# Patient Record
Sex: Female | Born: 1955 | Race: Black or African American | Hispanic: No | Smoking: Never smoker
Health system: Southern US, Community
[De-identification: ages and names within clinical notes are randomized; demographics above are authoritative.]

## PROBLEM LIST (undated history)

## (undated) DIAGNOSIS — R7303 Prediabetes: Secondary | ICD-10-CM

## (undated) DIAGNOSIS — Z789 Other specified health status: Secondary | ICD-10-CM

## (undated) DIAGNOSIS — E559 Vitamin D deficiency, unspecified: Secondary | ICD-10-CM

## (undated) DIAGNOSIS — G8929 Other chronic pain: Secondary | ICD-10-CM

## (undated) DIAGNOSIS — M0520 Rheumatoid vasculitis with rheumatoid arthritis of unspecified site: Secondary | ICD-10-CM

## (undated) DIAGNOSIS — I10 Essential (primary) hypertension: Secondary | ICD-10-CM

## (undated) DIAGNOSIS — E785 Hyperlipidemia, unspecified: Secondary | ICD-10-CM

## (undated) DIAGNOSIS — M1990 Unspecified osteoarthritis, unspecified site: Secondary | ICD-10-CM

## (undated) DIAGNOSIS — K219 Gastro-esophageal reflux disease without esophagitis: Secondary | ICD-10-CM

## (undated) DIAGNOSIS — M158 Other polyosteoarthritis: Secondary | ICD-10-CM

## (undated) DIAGNOSIS — M25569 Pain in unspecified knee: Secondary | ICD-10-CM

## (undated) DIAGNOSIS — Z7289 Other problems related to lifestyle: Secondary | ICD-10-CM

## (undated) DIAGNOSIS — F1090 Alcohol use, unspecified, uncomplicated: Secondary | ICD-10-CM

## (undated) DIAGNOSIS — M25551 Pain in right hip: Secondary | ICD-10-CM

## (undated) DIAGNOSIS — R011 Cardiac murmur, unspecified: Secondary | ICD-10-CM

## (undated) DIAGNOSIS — Z6836 Body mass index (BMI) 36.0-36.9, adult: Secondary | ICD-10-CM

## (undated) HISTORY — DX: Other polyosteoarthritis: M15.8

## (undated) HISTORY — DX: Hyperlipidemia, unspecified: E78.5

## (undated) HISTORY — DX: Pain in right hip: M25.551

## (undated) HISTORY — DX: Other problems related to lifestyle: Z72.89

## (undated) HISTORY — PX: TOE SURGERY: SHX1073

## (undated) HISTORY — DX: Cardiac murmur, unspecified: R01.1

## (undated) HISTORY — DX: Rheumatoid vasculitis with rheumatoid arthritis of unspecified site: M05.20

## (undated) HISTORY — DX: Unspecified osteoarthritis, unspecified site: M19.90

## (undated) HISTORY — DX: Prediabetes: R73.03

## (undated) HISTORY — DX: Morbid (severe) obesity due to excess calories: E66.01

## (undated) HISTORY — DX: Vitamin D deficiency, unspecified: E55.9

## (undated) HISTORY — PX: COLONOSCOPY: SHX174

## (undated) HISTORY — DX: Gastro-esophageal reflux disease without esophagitis: K21.9

## (undated) HISTORY — DX: Other specified health status: Z78.9

## (undated) HISTORY — PX: TUBAL LIGATION: SHX77

## (undated) HISTORY — DX: Alcohol use, unspecified, uncomplicated: F10.90

## (undated) HISTORY — DX: Body mass index (BMI) 36.0-36.9, adult: Z68.36

---

## 1976-07-10 HISTORY — PX: TUBAL LIGATION: SHX77

## 2000-11-23 ENCOUNTER — Emergency Department (HOSPITAL_COMMUNITY): Admission: EM | Admit: 2000-11-23 | Discharge: 2000-11-23 | Payer: Self-pay | Admitting: *Deleted

## 2000-11-23 ENCOUNTER — Encounter: Payer: Self-pay | Admitting: *Deleted

## 2004-02-20 ENCOUNTER — Emergency Department (HOSPITAL_COMMUNITY): Admission: EM | Admit: 2004-02-20 | Discharge: 2004-02-20 | Payer: Self-pay | Admitting: *Deleted

## 2004-03-03 ENCOUNTER — Emergency Department (HOSPITAL_COMMUNITY): Admission: EM | Admit: 2004-03-03 | Discharge: 2004-03-03 | Payer: Self-pay | Admitting: Emergency Medicine

## 2004-06-10 ENCOUNTER — Ambulatory Visit: Payer: Self-pay | Admitting: Internal Medicine

## 2004-06-21 ENCOUNTER — Ambulatory Visit: Payer: Self-pay | Admitting: Family Medicine

## 2004-06-22 ENCOUNTER — Ambulatory Visit: Payer: Self-pay | Admitting: *Deleted

## 2004-07-07 ENCOUNTER — Emergency Department (HOSPITAL_COMMUNITY): Admission: EM | Admit: 2004-07-07 | Discharge: 2004-07-07 | Payer: Self-pay | Admitting: Emergency Medicine

## 2004-07-11 ENCOUNTER — Ambulatory Visit: Payer: Self-pay | Admitting: Family Medicine

## 2004-09-13 ENCOUNTER — Ambulatory Visit: Payer: Self-pay | Admitting: Family Medicine

## 2004-09-27 ENCOUNTER — Ambulatory Visit: Payer: Self-pay | Admitting: Family Medicine

## 2004-09-30 ENCOUNTER — Ambulatory Visit (HOSPITAL_COMMUNITY): Admission: RE | Admit: 2004-09-30 | Discharge: 2004-09-30 | Payer: Self-pay | Admitting: Family Medicine

## 2004-10-11 ENCOUNTER — Ambulatory Visit: Payer: Self-pay | Admitting: Family Medicine

## 2004-10-17 ENCOUNTER — Ambulatory Visit: Payer: Self-pay | Admitting: Family Medicine

## 2004-11-16 ENCOUNTER — Ambulatory Visit: Payer: Self-pay | Admitting: Family Medicine

## 2005-03-14 ENCOUNTER — Emergency Department (HOSPITAL_COMMUNITY): Admission: EM | Admit: 2005-03-14 | Discharge: 2005-03-14 | Payer: Self-pay | Admitting: Emergency Medicine

## 2005-05-29 ENCOUNTER — Ambulatory Visit: Payer: Self-pay | Admitting: Internal Medicine

## 2005-09-21 ENCOUNTER — Ambulatory Visit: Payer: Self-pay | Admitting: Internal Medicine

## 2005-10-12 ENCOUNTER — Ambulatory Visit: Payer: Self-pay | Admitting: Family Medicine

## 2005-10-25 ENCOUNTER — Ambulatory Visit: Payer: Self-pay | Admitting: Family Medicine

## 2005-11-30 ENCOUNTER — Ambulatory Visit: Payer: Self-pay | Admitting: Family Medicine

## 2005-12-05 ENCOUNTER — Ambulatory Visit (HOSPITAL_COMMUNITY): Admission: RE | Admit: 2005-12-05 | Discharge: 2005-12-05 | Payer: Self-pay | Admitting: Family Medicine

## 2005-12-06 ENCOUNTER — Ambulatory Visit: Payer: Self-pay | Admitting: Internal Medicine

## 2005-12-07 ENCOUNTER — Ambulatory Visit: Payer: Self-pay | Admitting: Internal Medicine

## 2005-12-11 ENCOUNTER — Emergency Department (HOSPITAL_COMMUNITY): Admission: EM | Admit: 2005-12-11 | Discharge: 2005-12-11 | Payer: Self-pay | Admitting: Emergency Medicine

## 2005-12-19 ENCOUNTER — Ambulatory Visit: Payer: Self-pay | Admitting: Internal Medicine

## 2005-12-19 ENCOUNTER — Ambulatory Visit: Payer: Self-pay | Admitting: Cardiology

## 2005-12-19 ENCOUNTER — Inpatient Hospital Stay (HOSPITAL_COMMUNITY): Admission: EM | Admit: 2005-12-19 | Discharge: 2005-12-21 | Payer: Self-pay | Admitting: *Deleted

## 2005-12-22 ENCOUNTER — Ambulatory Visit: Payer: Self-pay | Admitting: *Deleted

## 2005-12-26 ENCOUNTER — Ambulatory Visit: Payer: Self-pay | Admitting: Family Medicine

## 2006-01-08 ENCOUNTER — Encounter (INDEPENDENT_AMBULATORY_CARE_PROVIDER_SITE_OTHER): Payer: Self-pay | Admitting: Family Medicine

## 2006-01-08 ENCOUNTER — Encounter (INDEPENDENT_AMBULATORY_CARE_PROVIDER_SITE_OTHER): Payer: Self-pay | Admitting: Nurse Practitioner

## 2006-01-08 ENCOUNTER — Ambulatory Visit: Payer: Self-pay | Admitting: Family Medicine

## 2006-01-08 LAB — CONVERTED CEMR LAB
Bilirubin Urine: NEGATIVE
Ketones, ur: NEGATIVE mg/dL
Nitrite: NEGATIVE
Pap Smear: NORMAL
Urobilinogen, UA: 0.2
pH: 5.5

## 2006-01-09 ENCOUNTER — Encounter (INDEPENDENT_AMBULATORY_CARE_PROVIDER_SITE_OTHER): Payer: Self-pay | Admitting: Nurse Practitioner

## 2006-01-09 LAB — CONVERTED CEMR LAB: Chlamydia, DNA Probe: NEGATIVE

## 2006-03-06 ENCOUNTER — Ambulatory Visit: Payer: Self-pay | Admitting: Family Medicine

## 2006-04-23 ENCOUNTER — Emergency Department (HOSPITAL_COMMUNITY): Admission: EM | Admit: 2006-04-23 | Discharge: 2006-04-23 | Payer: Self-pay | Admitting: Emergency Medicine

## 2006-04-26 ENCOUNTER — Ambulatory Visit: Payer: Self-pay | Admitting: Family Medicine

## 2006-04-27 ENCOUNTER — Encounter (INDEPENDENT_AMBULATORY_CARE_PROVIDER_SITE_OTHER): Payer: Self-pay | Admitting: Nurse Practitioner

## 2006-04-27 LAB — CONVERTED CEMR LAB
HDL: 55 mg/dL
LDL Cholesterol: 89 mg/dL
Total CHOL/HDL Ratio: 3.1
Triglycerides: 131 mg/dL

## 2006-05-03 ENCOUNTER — Ambulatory Visit: Payer: Self-pay | Admitting: Family Medicine

## 2006-07-05 ENCOUNTER — Ambulatory Visit: Payer: Self-pay | Admitting: Internal Medicine

## 2006-07-06 ENCOUNTER — Emergency Department (HOSPITAL_COMMUNITY): Admission: EM | Admit: 2006-07-06 | Discharge: 2006-07-06 | Payer: Self-pay | Admitting: Emergency Medicine

## 2006-07-16 ENCOUNTER — Ambulatory Visit (HOSPITAL_COMMUNITY): Admission: RE | Admit: 2006-07-16 | Discharge: 2006-07-16 | Payer: Self-pay | Admitting: Internal Medicine

## 2006-07-27 ENCOUNTER — Ambulatory Visit: Payer: Self-pay | Admitting: Internal Medicine

## 2006-08-01 ENCOUNTER — Emergency Department (HOSPITAL_COMMUNITY): Admission: EM | Admit: 2006-08-01 | Discharge: 2006-08-01 | Payer: Self-pay | Admitting: Emergency Medicine

## 2006-08-07 ENCOUNTER — Emergency Department (HOSPITAL_COMMUNITY): Admission: EM | Admit: 2006-08-07 | Discharge: 2006-08-07 | Payer: Self-pay | Admitting: Emergency Medicine

## 2006-08-08 ENCOUNTER — Encounter: Admission: RE | Admit: 2006-08-08 | Discharge: 2006-11-06 | Payer: Self-pay | Admitting: Internal Medicine

## 2006-08-21 ENCOUNTER — Ambulatory Visit: Payer: Self-pay | Admitting: Family Medicine

## 2006-08-30 ENCOUNTER — Ambulatory Visit: Payer: Self-pay | Admitting: Family Medicine

## 2006-11-27 ENCOUNTER — Emergency Department (HOSPITAL_COMMUNITY): Admission: EM | Admit: 2006-11-27 | Discharge: 2006-11-27 | Payer: Self-pay | Admitting: Emergency Medicine

## 2006-12-12 ENCOUNTER — Emergency Department (HOSPITAL_COMMUNITY): Admission: EM | Admit: 2006-12-12 | Discharge: 2006-12-12 | Payer: Self-pay | Admitting: Emergency Medicine

## 2006-12-25 ENCOUNTER — Emergency Department (HOSPITAL_COMMUNITY): Admission: EM | Admit: 2006-12-25 | Discharge: 2006-12-25 | Payer: Self-pay | Admitting: Emergency Medicine

## 2006-12-28 ENCOUNTER — Ambulatory Visit: Payer: Self-pay | Admitting: Internal Medicine

## 2007-01-01 ENCOUNTER — Ambulatory Visit (HOSPITAL_COMMUNITY): Admission: RE | Admit: 2007-01-01 | Discharge: 2007-01-01 | Payer: Self-pay | Admitting: Internal Medicine

## 2007-01-07 ENCOUNTER — Ambulatory Visit: Payer: Self-pay | Admitting: Internal Medicine

## 2007-02-06 ENCOUNTER — Ambulatory Visit: Payer: Self-pay | Admitting: Family Medicine

## 2007-02-08 ENCOUNTER — Ambulatory Visit: Payer: Self-pay | Admitting: Family Medicine

## 2007-02-14 ENCOUNTER — Emergency Department (HOSPITAL_COMMUNITY): Admission: EM | Admit: 2007-02-14 | Discharge: 2007-02-15 | Payer: Self-pay | Admitting: Emergency Medicine

## 2007-02-15 ENCOUNTER — Ambulatory Visit: Payer: Self-pay | Admitting: Internal Medicine

## 2007-02-20 ENCOUNTER — Ambulatory Visit: Payer: Self-pay | Admitting: Internal Medicine

## 2007-02-20 ENCOUNTER — Encounter (INDEPENDENT_AMBULATORY_CARE_PROVIDER_SITE_OTHER): Payer: Self-pay | Admitting: Nurse Practitioner

## 2007-02-20 LAB — CONVERTED CEMR LAB
Basophils Absolute: 0 10*3/uL (ref 0.0–0.1)
Basophils Relative: 0 % (ref 0–1)
MCHC: 32.3 g/dL (ref 30.0–36.0)
Neutro Abs: 4.1 10*3/uL (ref 1.7–7.7)
Neutrophils Relative %: 60 % (ref 43–77)
Platelets: 284 10*3/uL (ref 150–400)
RBC: 4.49 M/uL (ref 3.87–5.11)
RDW: 11.6 % (ref 11.5–14.0)

## 2007-02-21 ENCOUNTER — Encounter (INDEPENDENT_AMBULATORY_CARE_PROVIDER_SITE_OTHER): Payer: Self-pay | Admitting: Nurse Practitioner

## 2007-02-21 LAB — CONVERTED CEMR LAB
Hemoglobin: 13.6 g/dL
Lymphocytes Relative: 34 %
Monocytes Absolute: 0.4 10*3/uL
Monocytes Relative: 5 %
Neutro Abs: 41 10*3/uL
Neutrophils Relative %: 60 %
RBC: 4.49 M/uL
WBC: 6.9 10*3/uL

## 2007-03-11 ENCOUNTER — Emergency Department (HOSPITAL_COMMUNITY): Admission: EM | Admit: 2007-03-11 | Discharge: 2007-03-11 | Payer: Self-pay | Admitting: Emergency Medicine

## 2007-03-26 ENCOUNTER — Emergency Department (HOSPITAL_COMMUNITY): Admission: EM | Admit: 2007-03-26 | Discharge: 2007-03-27 | Payer: Self-pay | Admitting: Emergency Medicine

## 2007-03-27 ENCOUNTER — Encounter (INDEPENDENT_AMBULATORY_CARE_PROVIDER_SITE_OTHER): Payer: Self-pay | Admitting: *Deleted

## 2007-03-28 ENCOUNTER — Telehealth (INDEPENDENT_AMBULATORY_CARE_PROVIDER_SITE_OTHER): Payer: Self-pay | Admitting: *Deleted

## 2007-04-23 ENCOUNTER — Emergency Department (HOSPITAL_COMMUNITY): Admission: EM | Admit: 2007-04-23 | Discharge: 2007-04-24 | Payer: Self-pay | Admitting: Emergency Medicine

## 2007-04-24 ENCOUNTER — Telehealth (INDEPENDENT_AMBULATORY_CARE_PROVIDER_SITE_OTHER): Payer: Self-pay | Admitting: *Deleted

## 2007-04-25 ENCOUNTER — Emergency Department (HOSPITAL_COMMUNITY): Admission: EM | Admit: 2007-04-25 | Discharge: 2007-04-25 | Payer: Self-pay | Admitting: Emergency Medicine

## 2007-05-01 ENCOUNTER — Emergency Department (HOSPITAL_COMMUNITY): Admission: EM | Admit: 2007-05-01 | Discharge: 2007-05-01 | Payer: Self-pay | Admitting: Emergency Medicine

## 2007-05-16 ENCOUNTER — Emergency Department (HOSPITAL_COMMUNITY): Admission: EM | Admit: 2007-05-16 | Discharge: 2007-05-16 | Payer: Self-pay | Admitting: Emergency Medicine

## 2007-05-17 ENCOUNTER — Emergency Department (HOSPITAL_COMMUNITY): Admission: EM | Admit: 2007-05-17 | Discharge: 2007-05-17 | Payer: Self-pay | Admitting: Emergency Medicine

## 2007-05-17 ENCOUNTER — Emergency Department (HOSPITAL_COMMUNITY): Admission: EM | Admit: 2007-05-17 | Discharge: 2007-05-17 | Payer: Self-pay | Admitting: Family Medicine

## 2007-05-20 ENCOUNTER — Ambulatory Visit: Payer: Self-pay | Admitting: Nurse Practitioner

## 2007-05-20 DIAGNOSIS — I10 Essential (primary) hypertension: Secondary | ICD-10-CM | POA: Insufficient documentation

## 2007-05-23 ENCOUNTER — Encounter (INDEPENDENT_AMBULATORY_CARE_PROVIDER_SITE_OTHER): Payer: Self-pay | Admitting: Nurse Practitioner

## 2007-05-23 DIAGNOSIS — J309 Allergic rhinitis, unspecified: Secondary | ICD-10-CM | POA: Insufficient documentation

## 2007-05-23 DIAGNOSIS — IMO0002 Reserved for concepts with insufficient information to code with codable children: Secondary | ICD-10-CM | POA: Insufficient documentation

## 2007-05-23 DIAGNOSIS — B353 Tinea pedis: Secondary | ICD-10-CM | POA: Insufficient documentation

## 2007-05-23 DIAGNOSIS — M1710 Unilateral primary osteoarthritis, unspecified knee: Secondary | ICD-10-CM

## 2007-05-23 DIAGNOSIS — K449 Diaphragmatic hernia without obstruction or gangrene: Secondary | ICD-10-CM | POA: Insufficient documentation

## 2007-05-23 DIAGNOSIS — K219 Gastro-esophageal reflux disease without esophagitis: Secondary | ICD-10-CM | POA: Insufficient documentation

## 2007-05-27 ENCOUNTER — Ambulatory Visit: Payer: Self-pay | Admitting: Nurse Practitioner

## 2007-05-27 DIAGNOSIS — K029 Dental caries, unspecified: Secondary | ICD-10-CM | POA: Insufficient documentation

## 2007-06-24 ENCOUNTER — Encounter (INDEPENDENT_AMBULATORY_CARE_PROVIDER_SITE_OTHER): Payer: Self-pay | Admitting: Nurse Practitioner

## 2007-06-25 ENCOUNTER — Telehealth (INDEPENDENT_AMBULATORY_CARE_PROVIDER_SITE_OTHER): Payer: Self-pay | Admitting: Nurse Practitioner

## 2007-06-26 ENCOUNTER — Encounter (INDEPENDENT_AMBULATORY_CARE_PROVIDER_SITE_OTHER): Payer: Self-pay | Admitting: Nurse Practitioner

## 2007-06-28 ENCOUNTER — Ambulatory Visit: Payer: Self-pay | Admitting: Nurse Practitioner

## 2007-07-17 ENCOUNTER — Encounter (INDEPENDENT_AMBULATORY_CARE_PROVIDER_SITE_OTHER): Payer: Self-pay | Admitting: Nurse Practitioner

## 2007-07-18 ENCOUNTER — Telehealth (INDEPENDENT_AMBULATORY_CARE_PROVIDER_SITE_OTHER): Payer: Self-pay | Admitting: Nurse Practitioner

## 2007-07-23 ENCOUNTER — Ambulatory Visit (HOSPITAL_COMMUNITY): Admission: RE | Admit: 2007-07-23 | Discharge: 2007-07-23 | Payer: Self-pay | Admitting: Nurse Practitioner

## 2007-07-23 ENCOUNTER — Ambulatory Visit: Payer: Self-pay | Admitting: Nurse Practitioner

## 2007-07-23 DIAGNOSIS — K5900 Constipation, unspecified: Secondary | ICD-10-CM | POA: Insufficient documentation

## 2007-07-23 DIAGNOSIS — R3129 Other microscopic hematuria: Secondary | ICD-10-CM | POA: Insufficient documentation

## 2007-07-23 LAB — CONVERTED CEMR LAB
Glucose, Urine, Semiquant: NEGATIVE
KOH Prep: NEGATIVE
Ketones, urine, test strip: NEGATIVE
pH: 5.5

## 2007-07-24 ENCOUNTER — Encounter (INDEPENDENT_AMBULATORY_CARE_PROVIDER_SITE_OTHER): Payer: Self-pay | Admitting: Nurse Practitioner

## 2007-07-24 LAB — CONVERTED CEMR LAB
Albumin: 4.4 g/dL (ref 3.5–5.2)
Alkaline Phosphatase: 66 units/L (ref 39–117)
BUN: 16 mg/dL (ref 6–23)
CO2: 24 meq/L (ref 19–32)
Calcium: 9.9 mg/dL (ref 8.4–10.5)
Chloride: 103 meq/L (ref 96–112)
GC Probe Amp, Genital: NEGATIVE
Glucose, Bld: 82 mg/dL (ref 70–99)
HDL: 52 mg/dL (ref >39)
LDL Cholesterol: 97 mg/dL (ref 0–99)
Lymphocytes Relative: 30 % (ref 12–46)
Lymphs Abs: 2.3 10*3/uL (ref 0.7–4.0)
MCV: 96.7 fL (ref 78.0–100.0)
Monocytes Relative: 7 % (ref 3–12)
Neutro Abs: 4.6 10*3/uL (ref 1.7–7.7)
Neutrophils Relative %: 61 % (ref 43–77)
Platelets: 286 10*3/uL (ref 150–400)
Potassium: 3.9 meq/L (ref 3.5–5.3)
RBC: 4.28 M/uL (ref 3.87–5.11)
Sodium: 143 meq/L (ref 135–145)
Total Protein: 7.9 g/dL (ref 6.0–8.3)
Triglycerides: 169 mg/dL — ABNORMAL HIGH (ref <150)
WBC: 7.5 10*3/uL (ref 4.0–10.5)

## 2007-08-06 ENCOUNTER — Telehealth (INDEPENDENT_AMBULATORY_CARE_PROVIDER_SITE_OTHER): Payer: Self-pay | Admitting: Nurse Practitioner

## 2007-08-07 ENCOUNTER — Ambulatory Visit: Payer: Self-pay | Admitting: Nurse Practitioner

## 2007-08-07 DIAGNOSIS — R51 Headache: Secondary | ICD-10-CM | POA: Insufficient documentation

## 2007-08-07 DIAGNOSIS — R519 Headache, unspecified: Secondary | ICD-10-CM | POA: Insufficient documentation

## 2007-08-07 DIAGNOSIS — K2970 Gastritis, unspecified, without bleeding: Secondary | ICD-10-CM | POA: Insufficient documentation

## 2007-08-07 DIAGNOSIS — K2990 Gastroduodenitis, unspecified, without bleeding: Secondary | ICD-10-CM

## 2007-08-07 LAB — CONVERTED CEMR LAB: Helicobacter Pylori Antibody-IgG: 0.6

## 2007-08-08 ENCOUNTER — Ambulatory Visit (HOSPITAL_COMMUNITY): Admission: RE | Admit: 2007-08-08 | Discharge: 2007-08-08 | Payer: Self-pay | Admitting: Family Medicine

## 2007-08-08 ENCOUNTER — Encounter (INDEPENDENT_AMBULATORY_CARE_PROVIDER_SITE_OTHER): Payer: Self-pay | Admitting: Nurse Practitioner

## 2007-08-13 ENCOUNTER — Telehealth (INDEPENDENT_AMBULATORY_CARE_PROVIDER_SITE_OTHER): Payer: Self-pay | Admitting: Nurse Practitioner

## 2007-08-15 ENCOUNTER — Encounter (INDEPENDENT_AMBULATORY_CARE_PROVIDER_SITE_OTHER): Payer: Self-pay | Admitting: Nurse Practitioner

## 2007-08-20 ENCOUNTER — Encounter (INDEPENDENT_AMBULATORY_CARE_PROVIDER_SITE_OTHER): Payer: Self-pay | Admitting: *Deleted

## 2007-08-21 ENCOUNTER — Encounter (INDEPENDENT_AMBULATORY_CARE_PROVIDER_SITE_OTHER): Payer: Self-pay | Admitting: Family Medicine

## 2007-08-21 ENCOUNTER — Telehealth (INDEPENDENT_AMBULATORY_CARE_PROVIDER_SITE_OTHER): Payer: Self-pay | Admitting: *Deleted

## 2007-08-21 ENCOUNTER — Encounter: Admission: RE | Admit: 2007-08-21 | Discharge: 2007-08-21 | Payer: Self-pay | Admitting: Family Medicine

## 2007-08-23 ENCOUNTER — Ambulatory Visit (HOSPITAL_COMMUNITY): Admission: RE | Admit: 2007-08-23 | Discharge: 2007-08-23 | Payer: Self-pay | Admitting: Internal Medicine

## 2007-08-23 ENCOUNTER — Telehealth (INDEPENDENT_AMBULATORY_CARE_PROVIDER_SITE_OTHER): Payer: Self-pay | Admitting: Nurse Practitioner

## 2007-09-04 ENCOUNTER — Ambulatory Visit: Payer: Self-pay | Admitting: Internal Medicine

## 2007-09-24 ENCOUNTER — Telehealth (INDEPENDENT_AMBULATORY_CARE_PROVIDER_SITE_OTHER): Payer: Self-pay | Admitting: Nurse Practitioner

## 2007-09-26 ENCOUNTER — Ambulatory Visit: Payer: Self-pay | Admitting: Nurse Practitioner

## 2007-09-26 DIAGNOSIS — N3289 Other specified disorders of bladder: Secondary | ICD-10-CM | POA: Insufficient documentation

## 2007-09-26 LAB — CONVERTED CEMR LAB
Glucose, Urine, Semiquant: NEGATIVE
Ketones, urine, test strip: NEGATIVE
Nitrite: NEGATIVE
Specific Gravity, Urine: <1.005
pH: 6.5

## 2007-10-02 ENCOUNTER — Telehealth (INDEPENDENT_AMBULATORY_CARE_PROVIDER_SITE_OTHER): Payer: Self-pay | Admitting: Nurse Practitioner

## 2007-10-03 ENCOUNTER — Emergency Department (HOSPITAL_COMMUNITY): Admission: EM | Admit: 2007-10-03 | Discharge: 2007-10-03 | Payer: Self-pay | Admitting: Emergency Medicine

## 2007-10-14 ENCOUNTER — Telehealth (INDEPENDENT_AMBULATORY_CARE_PROVIDER_SITE_OTHER): Payer: Self-pay | Admitting: Nurse Practitioner

## 2007-10-16 ENCOUNTER — Encounter (INDEPENDENT_AMBULATORY_CARE_PROVIDER_SITE_OTHER): Payer: Self-pay | Admitting: Nurse Practitioner

## 2007-10-16 ENCOUNTER — Ambulatory Visit (HOSPITAL_COMMUNITY): Admission: RE | Admit: 2007-10-16 | Discharge: 2007-10-16 | Payer: Self-pay | Admitting: Internal Medicine

## 2007-10-16 ENCOUNTER — Emergency Department (HOSPITAL_COMMUNITY): Admission: EM | Admit: 2007-10-16 | Discharge: 2007-10-16 | Payer: Self-pay | Admitting: Emergency Medicine

## 2007-10-17 ENCOUNTER — Ambulatory Visit: Payer: Self-pay | Admitting: Nurse Practitioner

## 2007-11-04 ENCOUNTER — Emergency Department (HOSPITAL_COMMUNITY): Admission: EM | Admit: 2007-11-04 | Discharge: 2007-11-05 | Payer: Self-pay | Admitting: Emergency Medicine

## 2007-11-05 ENCOUNTER — Telehealth (INDEPENDENT_AMBULATORY_CARE_PROVIDER_SITE_OTHER): Payer: Self-pay | Admitting: Nurse Practitioner

## 2007-11-07 ENCOUNTER — Ambulatory Visit: Payer: Self-pay | Admitting: Nurse Practitioner

## 2007-11-07 DIAGNOSIS — E876 Hypokalemia: Secondary | ICD-10-CM | POA: Insufficient documentation

## 2007-11-07 LAB — CONVERTED CEMR LAB
BUN: 16 mg/dL
CO2: 24 meq/L
Calcium: 9.7 mg/dL
Chloride: 99 meq/L
Creatinine, Ser: 0.95 mg/dL
Glucose, Bld: 85 mg/dL
Potassium: 3.8 meq/L
Sodium: 139 meq/L

## 2007-11-08 ENCOUNTER — Encounter (INDEPENDENT_AMBULATORY_CARE_PROVIDER_SITE_OTHER): Payer: Self-pay | Admitting: Nurse Practitioner

## 2007-11-11 ENCOUNTER — Ambulatory Visit: Payer: Self-pay | Admitting: Nurse Practitioner

## 2007-11-11 ENCOUNTER — Telehealth (INDEPENDENT_AMBULATORY_CARE_PROVIDER_SITE_OTHER): Payer: Self-pay | Admitting: Nurse Practitioner

## 2007-11-14 ENCOUNTER — Telehealth (INDEPENDENT_AMBULATORY_CARE_PROVIDER_SITE_OTHER): Payer: Self-pay | Admitting: *Deleted

## 2007-11-15 ENCOUNTER — Telehealth (INDEPENDENT_AMBULATORY_CARE_PROVIDER_SITE_OTHER): Payer: Self-pay | Admitting: Nurse Practitioner

## 2007-12-08 ENCOUNTER — Emergency Department (HOSPITAL_COMMUNITY): Admission: EM | Admit: 2007-12-08 | Discharge: 2007-12-09 | Payer: Self-pay | Admitting: Emergency Medicine

## 2007-12-18 ENCOUNTER — Encounter (INDEPENDENT_AMBULATORY_CARE_PROVIDER_SITE_OTHER): Payer: Self-pay | Admitting: Nurse Practitioner

## 2008-01-08 ENCOUNTER — Telehealth (INDEPENDENT_AMBULATORY_CARE_PROVIDER_SITE_OTHER): Payer: Self-pay | Admitting: Nurse Practitioner

## 2008-01-15 ENCOUNTER — Emergency Department (HOSPITAL_COMMUNITY): Admission: EM | Admit: 2008-01-15 | Discharge: 2008-01-16 | Payer: Self-pay | Admitting: Emergency Medicine

## 2008-01-16 ENCOUNTER — Telehealth (INDEPENDENT_AMBULATORY_CARE_PROVIDER_SITE_OTHER): Payer: Self-pay | Admitting: Nurse Practitioner

## 2008-01-22 ENCOUNTER — Emergency Department (HOSPITAL_COMMUNITY): Admission: EM | Admit: 2008-01-22 | Discharge: 2008-01-22 | Payer: Self-pay | Admitting: Emergency Medicine

## 2008-02-03 ENCOUNTER — Ambulatory Visit: Payer: Self-pay | Admitting: Gastroenterology

## 2008-02-03 DIAGNOSIS — R079 Chest pain, unspecified: Secondary | ICD-10-CM | POA: Insufficient documentation

## 2008-02-05 ENCOUNTER — Ambulatory Visit (HOSPITAL_COMMUNITY): Admission: RE | Admit: 2008-02-05 | Discharge: 2008-02-05 | Payer: Self-pay | Admitting: Gastroenterology

## 2008-02-10 ENCOUNTER — Ambulatory Visit: Payer: Self-pay | Admitting: Gastroenterology

## 2008-02-15 ENCOUNTER — Emergency Department (HOSPITAL_COMMUNITY): Admission: EM | Admit: 2008-02-15 | Discharge: 2008-02-15 | Payer: Self-pay | Admitting: Emergency Medicine

## 2008-02-18 ENCOUNTER — Telehealth: Payer: Self-pay | Admitting: Gastroenterology

## 2008-02-24 ENCOUNTER — Telehealth (INDEPENDENT_AMBULATORY_CARE_PROVIDER_SITE_OTHER): Payer: Self-pay | Admitting: Nurse Practitioner

## 2008-02-28 ENCOUNTER — Ambulatory Visit: Payer: Self-pay | Admitting: Gastroenterology

## 2008-03-02 ENCOUNTER — Ambulatory Visit: Payer: Self-pay | Admitting: Gastroenterology

## 2008-03-02 ENCOUNTER — Telehealth (INDEPENDENT_AMBULATORY_CARE_PROVIDER_SITE_OTHER): Payer: Self-pay | Admitting: Nurse Practitioner

## 2008-03-04 LAB — CONVERTED CEMR LAB
ALT: 21 units/L (ref 0–35)
BUN: 19 mg/dL (ref 6–23)
Basophils Relative: 0 % (ref 0.0–3.0)
CO2: 31 meq/L (ref 19–32)
Calcium: 9.8 mg/dL (ref 8.4–10.5)
Chloride: 100 meq/L (ref 96–112)
Creatinine, Ser: 1.1 mg/dL (ref 0.4–1.2)
Eosinophils Relative: 3.4 % (ref 0.0–5.0)
GFR calc Af Amer: 67 mL/min
GFR calc non Af Amer: 55 mL/min
Glucose, Bld: 140 mg/dL — ABNORMAL HIGH (ref 70–99)
Hemoglobin: 12.9 g/dL (ref 12.0–15.0)
Lymphocytes Relative: 37.9 % (ref 12.0–46.0)
Neutro Abs: 4.6 10*3/uL (ref 1.4–7.7)
Neutrophils Relative %: 55.5 % (ref 43.0–77.0)
RBC: 4.2 M/uL (ref 3.87–5.11)
WBC: 8.3 10*3/uL (ref 4.5–10.5)

## 2008-03-06 ENCOUNTER — Ambulatory Visit (HOSPITAL_COMMUNITY): Admission: RE | Admit: 2008-03-06 | Discharge: 2008-03-06 | Payer: Self-pay | Admitting: Gastroenterology

## 2008-03-06 ENCOUNTER — Encounter: Payer: Self-pay | Admitting: Gastroenterology

## 2008-03-10 ENCOUNTER — Telehealth: Payer: Self-pay | Admitting: Gastroenterology

## 2008-03-10 ENCOUNTER — Telehealth (INDEPENDENT_AMBULATORY_CARE_PROVIDER_SITE_OTHER): Payer: Self-pay | Admitting: *Deleted

## 2008-03-11 ENCOUNTER — Encounter: Payer: Self-pay | Admitting: Gastroenterology

## 2008-03-12 ENCOUNTER — Ambulatory Visit: Payer: Self-pay | Admitting: Nurse Practitioner

## 2008-04-02 ENCOUNTER — Telehealth (INDEPENDENT_AMBULATORY_CARE_PROVIDER_SITE_OTHER): Payer: Self-pay | Admitting: Nurse Practitioner

## 2008-05-14 ENCOUNTER — Telehealth (INDEPENDENT_AMBULATORY_CARE_PROVIDER_SITE_OTHER): Payer: Self-pay | Admitting: Nurse Practitioner

## 2008-06-24 ENCOUNTER — Telehealth (INDEPENDENT_AMBULATORY_CARE_PROVIDER_SITE_OTHER): Payer: Self-pay | Admitting: *Deleted

## 2008-06-24 ENCOUNTER — Emergency Department (HOSPITAL_COMMUNITY): Admission: EM | Admit: 2008-06-24 | Discharge: 2008-06-24 | Payer: Self-pay | Admitting: Emergency Medicine

## 2008-08-31 ENCOUNTER — Telehealth (INDEPENDENT_AMBULATORY_CARE_PROVIDER_SITE_OTHER): Payer: Self-pay | Admitting: Nurse Practitioner

## 2008-08-31 ENCOUNTER — Ambulatory Visit: Payer: Self-pay | Admitting: Nurse Practitioner

## 2008-08-31 DIAGNOSIS — J029 Acute pharyngitis, unspecified: Secondary | ICD-10-CM | POA: Insufficient documentation

## 2008-08-31 DIAGNOSIS — J069 Acute upper respiratory infection, unspecified: Secondary | ICD-10-CM | POA: Insufficient documentation

## 2008-08-31 LAB — CONVERTED CEMR LAB: Rapid Strep: NEGATIVE

## 2008-09-01 LAB — CONVERTED CEMR LAB
Eosinophils Absolute: 0.1 10*3/uL (ref 0.0–0.7)
Lymphs Abs: 3.1 10*3/uL (ref 0.7–4.0)
MCV: 91.5 fL (ref 78.0–100.0)
Neutro Abs: 3.3 10*3/uL (ref 1.7–7.7)
Neutrophils Relative %: 48 % (ref 43–77)
Platelets: 275 10*3/uL (ref 150–400)
WBC: 6.9 10*3/uL (ref 4.0–10.5)

## 2008-11-25 ENCOUNTER — Telehealth (INDEPENDENT_AMBULATORY_CARE_PROVIDER_SITE_OTHER): Payer: Self-pay | Admitting: *Deleted

## 2008-12-24 ENCOUNTER — Ambulatory Visit: Payer: Self-pay | Admitting: Nurse Practitioner

## 2008-12-24 DIAGNOSIS — R209 Unspecified disturbances of skin sensation: Secondary | ICD-10-CM | POA: Insufficient documentation

## 2009-01-14 ENCOUNTER — Telehealth (INDEPENDENT_AMBULATORY_CARE_PROVIDER_SITE_OTHER): Payer: Self-pay | Admitting: Nurse Practitioner

## 2009-01-20 ENCOUNTER — Telehealth (INDEPENDENT_AMBULATORY_CARE_PROVIDER_SITE_OTHER): Payer: Self-pay | Admitting: Nurse Practitioner

## 2009-02-25 ENCOUNTER — Ambulatory Visit: Payer: Self-pay | Admitting: Nurse Practitioner

## 2009-02-25 LAB — CONVERTED CEMR LAB
ALT: 22 U/L
AST: 21 U/L
Albumin: 4.2 g/dL
Alkaline Phosphatase: 78 U/L
BUN: 18 mg/dL
Basophils Absolute: 0 K/uL
Basophils Relative: 1 %
Bilirubin Urine: NEGATIVE
CO2: 25 meq/L
Calcium: 9.3 mg/dL
Chloride: 101 meq/L
Cholesterol: 177 mg/dL
Creatinine, Ser: 0.69 mg/dL
Eosinophils Absolute: 0.2 K/uL
Eosinophils Relative: 3 %
Glucose, Bld: 89 mg/dL
Glucose, Urine, Semiquant: NEGATIVE
HCT: 38.5 %
HDL: 44 mg/dL
Hemoglobin: 12.7 g/dL
Ketones, urine, test strip: NEGATIVE
LDL Cholesterol: 95 mg/dL
Lymphocytes Relative: 38 %
Lymphs Abs: 2.5 K/uL
MCHC: 33 g/dL
MCV: 91.4 fL
Microalb, Ur: 1.41 mg/dL
Monocytes Absolute: 0.6 K/uL
Monocytes Relative: 9 %
Neutro Abs: 3.3 K/uL
Neutrophils Relative %: 50 %
Nitrite: NEGATIVE
Platelets: 307 K/uL
Potassium: 4 meq/L
RBC: 4.21 M/uL
RDW: 12.4 %
Sodium: 141 meq/L
Specific Gravity, Urine: >=1.03
TSH: 1.901 u[IU]/mL
Total Bilirubin: 0.3 mg/dL
Total CHOL/HDL Ratio: 4
Total Protein: 7.8 g/dL
Triglycerides: 190 mg/dL — ABNORMAL HIGH
Urobilinogen, UA: 0.2
VLDL: 38 mg/dL
WBC Urine, dipstick: NEGATIVE
WBC: 6.7 10*3/microliter
pH: 5.5

## 2009-02-26 ENCOUNTER — Encounter (INDEPENDENT_AMBULATORY_CARE_PROVIDER_SITE_OTHER): Payer: Self-pay | Admitting: Nurse Practitioner

## 2009-03-12 ENCOUNTER — Ambulatory Visit: Payer: Self-pay | Admitting: Nurse Practitioner

## 2009-03-12 DIAGNOSIS — B372 Candidiasis of skin and nail: Secondary | ICD-10-CM | POA: Insufficient documentation

## 2009-04-19 ENCOUNTER — Ambulatory Visit: Payer: Self-pay | Admitting: Internal Medicine

## 2009-05-10 ENCOUNTER — Telehealth (INDEPENDENT_AMBULATORY_CARE_PROVIDER_SITE_OTHER): Payer: Self-pay | Admitting: Nurse Practitioner

## 2009-05-20 ENCOUNTER — Ambulatory Visit: Payer: Self-pay | Admitting: Nurse Practitioner

## 2009-05-20 ENCOUNTER — Encounter (INDEPENDENT_AMBULATORY_CARE_PROVIDER_SITE_OTHER): Payer: Self-pay | Admitting: Nurse Practitioner

## 2009-05-20 DIAGNOSIS — F411 Generalized anxiety disorder: Secondary | ICD-10-CM | POA: Insufficient documentation

## 2009-05-20 LAB — CONVERTED CEMR LAB

## 2009-05-27 ENCOUNTER — Encounter (INDEPENDENT_AMBULATORY_CARE_PROVIDER_SITE_OTHER): Payer: Self-pay | Admitting: Nurse Practitioner

## 2009-05-28 ENCOUNTER — Ambulatory Visit (HOSPITAL_COMMUNITY): Admission: RE | Admit: 2009-05-28 | Discharge: 2009-05-28 | Payer: Self-pay | Admitting: Internal Medicine

## 2009-07-14 ENCOUNTER — Emergency Department (HOSPITAL_COMMUNITY): Admission: EM | Admit: 2009-07-14 | Discharge: 2009-07-14 | Payer: Self-pay | Admitting: Family Medicine

## 2009-07-31 ENCOUNTER — Emergency Department (HOSPITAL_COMMUNITY): Admission: EM | Admit: 2009-07-31 | Discharge: 2009-07-31 | Payer: Self-pay | Admitting: Emergency Medicine

## 2009-08-12 ENCOUNTER — Ambulatory Visit: Payer: Self-pay | Admitting: Nurse Practitioner

## 2009-08-12 DIAGNOSIS — M6530 Trigger finger, unspecified finger: Secondary | ICD-10-CM | POA: Insufficient documentation

## 2009-09-01 ENCOUNTER — Telehealth (INDEPENDENT_AMBULATORY_CARE_PROVIDER_SITE_OTHER): Payer: Self-pay | Admitting: Nurse Practitioner

## 2009-11-27 IMAGING — CR DG UGI W/ SMALL BOWEL HIGH DENSITY
3 series · 3 of 3 positions shown · non-contrast
Comparison: none

CLINICAL DATA: Gastritis, abdominal pain.
 UPPER GI WITH SMALL BOWEL FOLLOW THROUGH:

[view not recorded (1 of 3)]
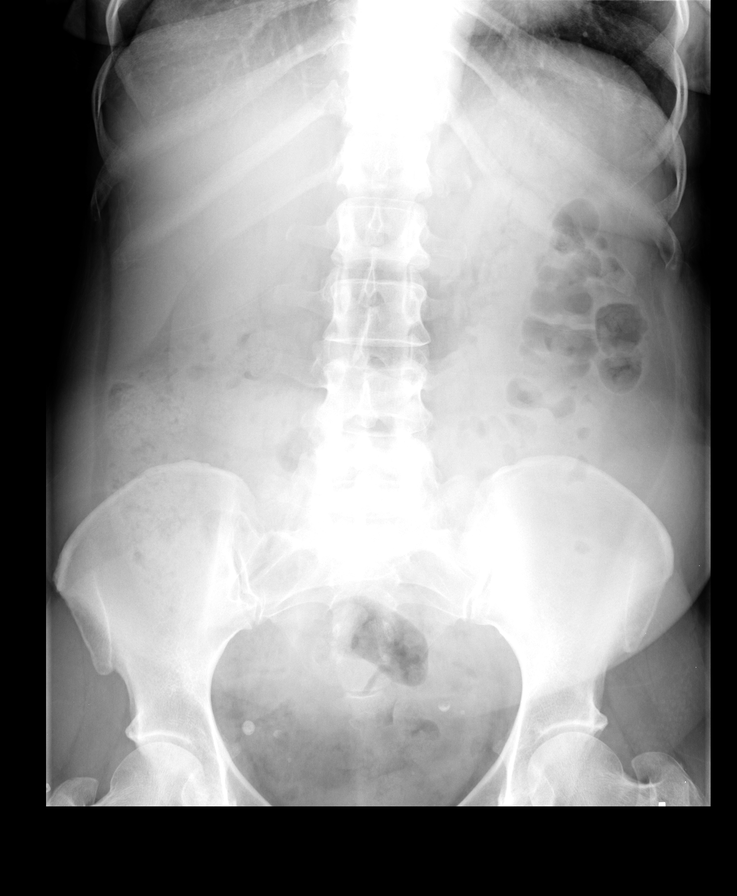

[view not recorded (2 of 3)]
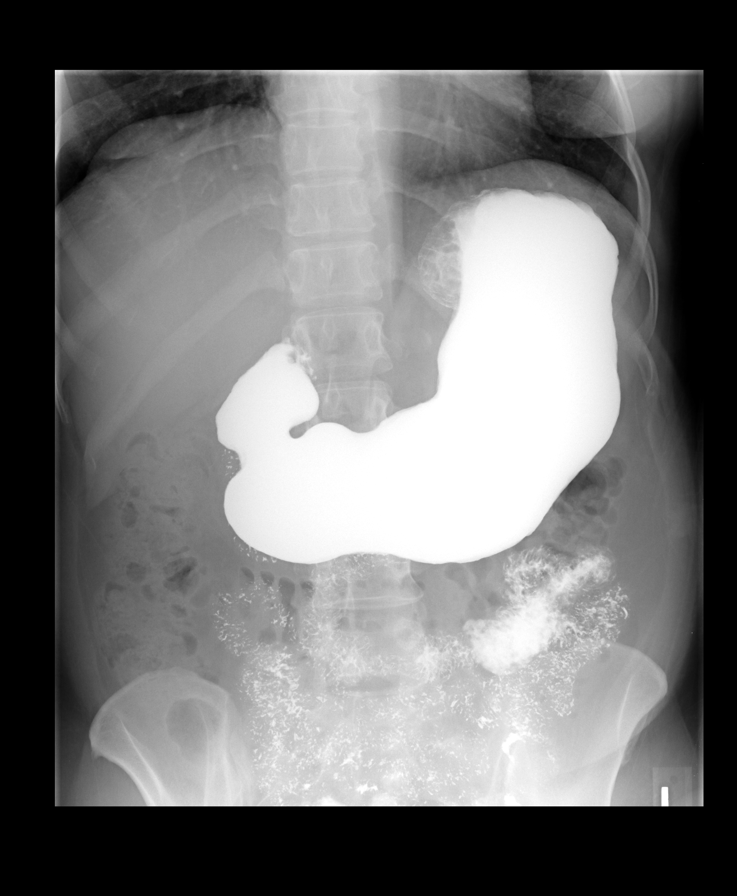

[view not recorded (3 of 3)]
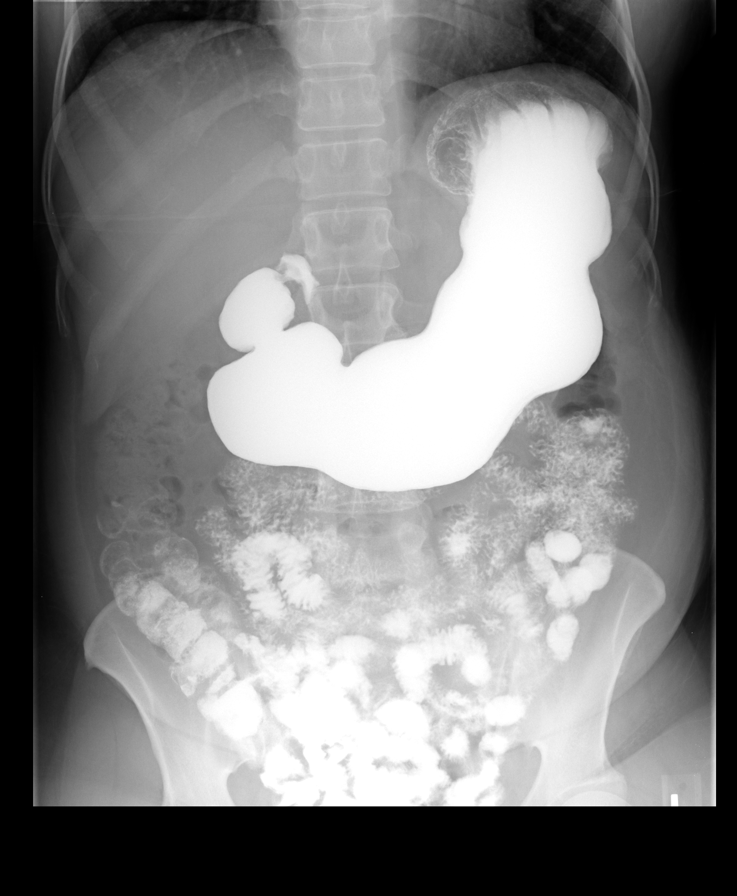

[3 of 3 positions shown; findings below may reference images not displayed]

FINDINGS: Preliminary film of the abdomen reveals no significant abnormality.  
 High density upper GI was performed.  The esophageal mucosa and motility are normal.  There is a small hiatal hernia with mild reflux.  The stomach and duodenal bulb appear normal.  There is no ulcer or mass lesion.  
 Small bowel follow through was subsequently performed.  The patient has relatively rapid small bowel transit time of less than 30 minutes.  Small bowel is normal in caliber.  The mucosa appears normal.  The terminal ileum appears normal.  There is no stricture or mass.
IMPRESSION: 1.  Small hiatal hernia with mild gastroesophageal reflux. 
 2.  Normal small bowel study.

## 2010-03-08 ENCOUNTER — Telehealth (INDEPENDENT_AMBULATORY_CARE_PROVIDER_SITE_OTHER): Payer: Self-pay | Admitting: Nurse Practitioner

## 2010-05-26 ENCOUNTER — Emergency Department (HOSPITAL_BASED_OUTPATIENT_CLINIC_OR_DEPARTMENT_OTHER)
Admission: EM | Admit: 2010-05-26 | Discharge: 2010-05-26 | Payer: Self-pay | Source: Home / Self Care | Admitting: Emergency Medicine

## 2010-05-26 ENCOUNTER — Ambulatory Visit: Payer: Self-pay | Admitting: Diagnostic Radiology

## 2010-06-08 ENCOUNTER — Encounter (INDEPENDENT_AMBULATORY_CARE_PROVIDER_SITE_OTHER): Payer: Self-pay | Admitting: *Deleted

## 2010-06-08 ENCOUNTER — Telehealth (INDEPENDENT_AMBULATORY_CARE_PROVIDER_SITE_OTHER): Payer: Self-pay | Admitting: Nurse Practitioner

## 2010-06-30 ENCOUNTER — Encounter (INDEPENDENT_AMBULATORY_CARE_PROVIDER_SITE_OTHER): Payer: Self-pay | Admitting: Nurse Practitioner

## 2010-06-30 ENCOUNTER — Ambulatory Visit: Payer: Self-pay | Admitting: Nurse Practitioner

## 2010-06-30 LAB — CONVERTED CEMR LAB
ALT: 20 units/L (ref 0–35)
AST: 22 units/L (ref 0–37)
Alkaline Phosphatase: 80 units/L (ref 39–117)
Basophils Absolute: 0 10*3/uL (ref 0.0–0.1)
Basophils Relative: 0 % (ref 0–1)
CO2: 28 meq/L (ref 19–32)
Cholesterol: 182 mg/dL (ref 0–200)
Creatinine, Ser: 0.69 mg/dL (ref 0.40–1.20)
Eosinophils Absolute: 0.2 10*3/uL (ref 0.0–0.7)
Eosinophils Relative: 3 % (ref 0–5)
HCT: 40.4 % (ref 36.0–46.0)
Hemoglobin: 13.3 g/dL (ref 12.0–15.0)
LDL Cholesterol: 109 mg/dL — ABNORMAL HIGH (ref 0–99)
Lymphocytes Relative: 38 % (ref 12–46)
MCHC: 32.9 g/dL (ref 30.0–36.0)
Monocytes Absolute: 0.4 10*3/uL (ref 0.1–1.0)
Platelets: 281 10*3/uL (ref 150–400)
RDW: 12.3 % (ref 11.5–15.5)
Sodium: 141 meq/L (ref 135–145)
Total Bilirubin: 0.4 mg/dL (ref 0.3–1.2)
Total CHOL/HDL Ratio: 4.6
Total Protein: 7.6 g/dL (ref 6.0–8.3)
VLDL: 33 mg/dL (ref 0–40)

## 2010-07-01 ENCOUNTER — Encounter (INDEPENDENT_AMBULATORY_CARE_PROVIDER_SITE_OTHER): Payer: Self-pay | Admitting: Nurse Practitioner

## 2010-07-12 ENCOUNTER — Telehealth (INDEPENDENT_AMBULATORY_CARE_PROVIDER_SITE_OTHER): Payer: Self-pay | Admitting: Nurse Practitioner

## 2010-07-18 ENCOUNTER — Encounter (INDEPENDENT_AMBULATORY_CARE_PROVIDER_SITE_OTHER): Payer: Self-pay | Admitting: Nurse Practitioner

## 2010-07-31 ENCOUNTER — Encounter: Payer: Self-pay | Admitting: Internal Medicine

## 2010-07-31 ENCOUNTER — Encounter: Payer: Self-pay | Admitting: Family Medicine

## 2010-08-02 ENCOUNTER — Ambulatory Visit (HOSPITAL_COMMUNITY)
Admission: RE | Admit: 2010-08-02 | Discharge: 2010-08-02 | Payer: Self-pay | Source: Home / Self Care | Attending: Internal Medicine | Admitting: Internal Medicine

## 2010-08-07 LAB — CONVERTED CEMR LAB: GC Probe Amp, Genital: NEGATIVE

## 2010-08-09 NOTE — Progress Notes (Signed)
Summary: cold symptoms   Phone Note Call from Patient Call back at [facility] Phone [phone]   Summary of Call: Since early morning, she is feeling very sick (cold, headache, diarrhea, and stomach and body pain).  The pt only had been taking antibiotic for her hand, and her regular blood pressure pills nothing else.  Please call her back as soon as possible.  [facility] [address]) Daphine Deutscher FNP Initial call taken by: Manon Hilding,  September 01, 2009 9:11 AM  Follow-up for Phone Call        spoke with pt she says that x3 days she has been having bodyaches, itchy throat, and chills she says that she had strep throat a month or so ago and she is asking what is it that she can take for these symptoms.  She said she got some Benadryl and she is feeling better and she said that she was taking coracidin liquid but they don't make it anymore and the pills don't work for her. I told her that I would get this to her provider. Follow-up by: Levon Hedger,  September 02, 2009 5:45 PM  Additional Follow-up for Phone Call Additional follow up Details #1::        Yes, she should take coricidan HBP which is for cough since she has high blood pressure. It is still made perhaps she just needs to find a store that has it.. lots of cough/cold symptoms going around so some stores are selling out. Also drink tea, tylenol for body aches (no ibuprofen because of her stomach), gargle with warm salt water for her throat Additional Follow-up by: Lehman Prom FNP,  September 03, 2009 8:21 AM    Additional Follow-up for Phone Call Additional follow up Details #2::    Levon Hedger  September 03, 2009 3:22 PM Left message on machine for pt to return call to the office.  Levon Hedger  September 09, 2009 3:39 PM Pt informed. Follow-up by: Levon Hedger,  September 09, 2009 3:39 PM

## 2010-08-09 NOTE — Letter (Signed)
Summary: TEST ORDER FORM/MAMMOGRAM//APPT DATE & TIME  TEST ORDER FORM/MAMMOGRAM//APPT DATE & TIME   Imported By: Arta Bruce 07/21/2009 14:27:00  _____________________________________________________________________  External Attachment:    Type:   Image     Comment:   External Document

## 2010-08-09 NOTE — Letter (Signed)
Summary: Generic Letter  [facility]  [address] [place], [place] [address]   Phone: [phone]  Fax: [phone]    06/08/2010  Jerlyn Swader [address] [place], [place]  [address]  Dear Ms. Scaglione,           Sincerely,   Gaylyn Cheers RN

## 2010-08-09 NOTE — Progress Notes (Signed)
Summary: Query refills Nexium, K-tab, Lexapro  Phone Note Refill Request Message from:  Patient on June 08, 2010 10:50 AM  Refills Requested: Medication #1:  NEXIUM 40 MG  CPDR 1 tablet by mouth two times a day for stomach  Medication #2:  K-TABS 10 MEQ  TBCR 1 tablet by mouth daily  Medication #3:  LEXAPRO 10 MG TABS 1 tablet by mouth daily for mood [facility]  Initial call taken by: Cheryll Dessert,  June 08, 2010 10:50 AM  Follow-up for Phone Call        Last seen 08/12/09, K-tab last filled 04/13/09 with 5 RF, Nexium 02/25/09 with 11 RF, Lexapro 02/25/09 with 5 RF Do you want to refill? Follow-up by: Gaylyn Cheers RN,  June 08, 2010 11:01 AM  Additional Follow-up for Phone Call Additional follow up Details #1::        pt missed her last appt with me ok to refill lexapro and nexium will need to be seen and have labs done for potassium.  unsure if she still needs supplement without a visit Additional Follow-up by: Lehman Prom FNP,  June 08, 2010 11:54 AM    Additional Follow-up for Phone Call Additional follow up Details #2::    Attempted to contact pt. mailbox full will send letter. Has appt. with provider 06/30/10.  Follow-up by: Gaylyn Cheers RN,  June 08, 2010 12:09 PM  Prescriptions: LEXAPRO 10 MG TABS (ESCITALOPRAM OXALATE) 1 tablet by mouth daily for mood  #30 x 4   Entered by:   Gaylyn Cheers RN   Authorized by:   Lehman Prom FNP   Signed by:   Gaylyn Cheers RN on 06/08/2010   Method used:   Faxed to ...       [facility] (retail)       [address] [place], [place]  [address]       Ph: [phone]       Fax: [phone]   RxID:   [id number] NEXIUM 40 MG  CPDR (ESOMEPRAZOLE MAGNESIUM) 1 tablet by mouth two times a day for stomach  #60 x 4   Entered by:   Gaylyn Cheers RN   Authorized by:   Lehman Prom FNP   Signed by:   Gaylyn Cheers RN on 06/08/2010   Method used:    Faxed to ...       [facility] (retail)       [address] [place], [place]  [address]       Ph: [phone]       Fax: [phone]   RxID:   [id number]

## 2010-08-09 NOTE — Assessment & Plan Note (Signed)
Summary: Left thumb trigger finger   Vital Signs:  Patient profile:   55 year old female Menstrual status:  postmenopausal Weight:      161.3 pounds BMI:     32.15 BSA:     1.69 Temp:     97.8 degrees F oral Pulse rate:   80 / minute Pulse rhythm:   regular Resp:     20 per minute BP sitting:   158 / 85  (left arm) Cuff size:   regular  Vitals Entered By: Levon Hedger (August 12, 2009 3:49 PM) CC: pain in thumb x 2 months...went to [facility] urgent care and treated for tendonitis, Hypertension Management Is Patient Diabetic? No Pain Assessment Patient in pain? yes     Location: thumb  Does patient need assistance? Functional Status Self care Ambulation Normal   Primary Care Provider:  [facility]  CC:  pain in thumb x 2 months...went to [facility] urgent care and treated for tendonitis and Hypertension Management.  History of Present Illness:  Pt into the office for follow up on Urgent care visit for left thumb tendonitis. Started about 2 months ago. Pt was seen in the urgent care and was started on mobic without relief.   She even purchased a brace to wear at work. Takes the brace off during the day to do work not accomplished with brace. No popping Sometimes difficult to straighten   Hypertension History:      no bp meds today.        Positive major cardiovascular risk factors include hypertension.  Negative major cardiovascular risk factors include female age less than 17 years old and non-tobacco-user status.        Further assessment for target organ damage reveals no history of ASHD, cardiac end-organ damage (CHF/LVH), stroke/TIA, peripheral vascular disease, renal insufficiency, or hypertensive retinopathy.     Allergies: 1)  ! Ibuprofen  Review of Systems CV:  Denies chest pain or discomfort. Resp:  Denies cough. GI:  Denies abdominal pain, nausea, and vomiting. MS:  left thumb pain.  Physical Exam  General:  alert.   Head:  normocephalic.   Eyes:   glasses Mouth:  missing upper teeth Msk:  left thumb - active ROM but pain to lateral tendon Neurologic:  alert & oriented X3.     Impression & Recommendations:  Problem # 1:  TRIGGER FINGER, LEFT THUMB (ICD-727.03) handout given  will give prednisone taper and anti-inflammatories Orders: Ace  Bandage < 3in. [phone])  Problem # 2:  ESSENTIAL HYPERTENSION, BENIGN (ICD-401.1) advised pt to take bp meds daily Her updated medication list for this problem includes:    Maxzide-25 37.5-25 Mg Tabs (Triamterene-hctz) .Marland Kitchen... 1 by mouth daily for blood pressure/fluid  Complete Medication List: 1)  Maxzide-25 37.5-25 Mg Tabs (Triamterene-hctz) .Marland Kitchen.. 1 by mouth daily for blood pressure/fluid 2)  Nexium 40 Mg Cpdr (Esomeprazole magnesium) .Marland Kitchen.. 1 tablet by mouth two times a day for stomach 3)  K-tabs 10 Meq Tbcr (Potassium chloride) .Marland Kitchen.. 1 tablet by mouth daily 4)  Fibercon 625 Mg Tabs (Calcium polycarbophil) .Marland Kitchen.. 1 tablet by mouth two times a day 5)  Lexapro 10 Mg Tabs (Escitalopram oxalate) .Marland Kitchen.. 1 tablet by mouth daily for mood 6)  Multivitamins Tabs (Multiple vitamin) .Marland Kitchen.. 1 tablet by mouth daily 7)  Nystatin-triamcinolone 100000-0.1 Unit/gm-% Crea (Nystatin-triamcinolone) .Marland Kitchen.. 1 application topically two times a day to affected area 8)  Ketoconazole 200 Mg Tabs (Ketoconazole) .... One tablet by mouth daily x 7 days 9)  Lotrisone  1-0.05 % Crea (Clotrimazole-betamethasone) .... One application topically two times a day for at least 4 weeks 10)  Prednisone 10 Mg Tabs (Prednisone) .... 30mg  x 2 days, 20mg  x 2 days, 10mg  x 2 days for inflammation 11)  Indomethacin 50 Mg Caps (Indomethacin) .... One capsule by mouth two times a day as needed for pain  Hypertension Assessment/Plan:      The patient's hypertensive risk group is category A: No risk factors and no target organ damage.  Her calculated 10 year risk of coronary heart disease is 9 %.  Today's blood pressure is 158/85.  Her blood pressure goal  is < 140/90.  Patient Instructions: 1)  Wear the ace wrap to your left thumb during the day.  2)  You will need to wear this for at least the next 2 weeks. 3)  Anti-inflammatory - take indomethicin 50mg  by mouth two times a day x 3 days then as needed  4)  Prednisone - Friday and saturday - 3 tablets 5)  Sunday and Monday - 2 tablets 6)  Tuesday and Wednesday - 1 tablet Prescriptions: INDOMETHACIN 50 MG CAPS (INDOMETHACIN) One capsule by mouth two times a day as needed for pain  #30 x 0   Entered and Authorized by:   Lehman Prom FNP   Signed by:   Lehman Prom FNP on 08/12/2009   Method used:   Print then Give to Patient   RxID:   [id number] PREDNISONE 10 MG TABS (PREDNISONE) 30mg  x 2 days, 20mg  x 2 days, 10mg  x 2 days for inflammation  #12 x 0   Entered and Authorized by:   Lehman Prom FNP   Signed by:   Lehman Prom FNP on 08/12/2009   Method used:   Print then Give to Patient   RxID:   [id number]

## 2010-08-09 NOTE — Letter (Signed)
Summary: Handout Printed  Printed Handout:  - Trigger Finger, Digital Tendinitis & Stenosing Tenosynovitis 

## 2010-08-09 NOTE — Progress Notes (Signed)
Summary: Query:  Refill Triamterene/HCTZ?   Phone Note Outgoing Call   Summary of Call: Do you want to refill her Triamterene/HCTZ?  Last visit 2/11, last CPE 05/2009. Initial call taken by: Dutch Quint RN,  March 08, 2010 12:31 PM  Follow-up for Phone Call        ok to refill Follow-up by: Lehman Prom FNP,  March 08, 2010 3:07 PM    Prescriptions: MAXZIDE-25 37.5-25 MG  TABS (TRIAMTERENE-HCTZ) 1 by mouth daily for blood pressure/fluid  #30 x 4   Entered by:   Dutch Quint RN   Authorized by:   Lehman Prom FNP   Signed by:   Dutch Quint RN on 03/08/2010   Method used:   Electronically to        [facility] [phone]* (retail)       [address]       [place], [place]  [address]       Ph: [phone] or [phone]       Fax: [phone]   RxID:   [id number]

## 2010-08-09 NOTE — Letter (Signed)
Summary: Generic Letter  [facility]  [address] [place], [place] [address]   Phone: [phone]  Fax: [phone]          06/08/2010  Amybeth Orrison [address] [place], [place]  [address]  Dear Ms. Emmerich,  Please contact us to schedule a lab appointment to have your Potassium checked.    Sincerely,   Gaylyn Cheers RN

## 2010-08-10 ENCOUNTER — Encounter: Payer: Self-pay | Admitting: Internal Medicine

## 2010-08-11 NOTE — Assessment & Plan Note (Signed)
Summary: HTN   Vital Signs:  Patient profile:   55 year old female Menstrual status:  postmenopausal Weight:      157.2 pounds BMI:     31.33 Temp:     97.3 degrees F oral Pulse rate:   80 / minute Pulse rhythm:   regular Resp:     20 per minute BP sitting:   126 / 78  (left arm) Cuff size:   regular  Vitals Entered By: Levon Hedger (June 30, 2010 11:01 AM)  Nutrition Counseling: Patient's BMI is greater than 25 and therefore counseled on weight management options. CC: need potassium medication refill...f/u visit, Hypertension Management, Abdominal Pain Is Patient Diabetic? No Pain Assessment Patient in pain? no       Does patient need assistance? Functional Status Self care Ambulation Normal   Primary Care Provider:  [facility]  CC:  need potassium medication refill...f/u visit, Hypertension Management, and Abdominal Pain.  History of Present Illness:  Pt into the office for f/u on labs  Obesity - down 4 pounds since last visit.  Dyspepsia History:      She has no alarm features of dyspepsia including no history of melena, hematochezia, dysphagia, persistent vomiting, or involuntary weight loss > 5%.  There is a prior history of GERD.  The patient does not have a prior history of documented ulcer disease.  The dominant symptom is not heartburn or acid reflux.  An H-2 blocker medication is not currently being taken.  She has no history of a positive H. Pylori serology.  A prior EGD has been done.    Hypertension History:      She denies headache, chest pain, and palpitations.        Positive major cardiovascular risk factors include female age 46 years old or older and hypertension.  Negative major cardiovascular risk factors include non-tobacco-user status.        Further assessment for target organ damage reveals no history of ASHD, cardiac end-organ damage (CHF/LVH), stroke/TIA, peripheral vascular disease, renal insufficiency, or hypertensive retinopathy.        Allergies (verified): 1)  ! Ibuprofen  Review of Systems General:  Denies loss of appetite. CV:  Denies chest pain or discomfort. Resp:  Denies chest discomfort. GI:  Denies abdominal pain, nausea, and vomiting.  Physical Exam  General:  alert.   Head:  normocephalic.   Ears:  ear piercing(s) noted.   Lungs:  normal breath sounds.   Heart:  normal rate and regular rhythm.   Abdomen:  normal bowel sounds.   Msk:  normal ROM.   Neurologic:  alert & oriented X3.   Skin:  color normal.   Psych:  Oriented X3.     Impression & Recommendations:  Problem # 1:  ESSENTIAL HYPERTENSION, BENIGN (ICD-401.1) BP is stable continue current meds DASH diet Her updated medication list for this problem includes:    Maxzide-25 37.5-25 Mg Tabs (Triamterene-hctz) .Marland Kitchen... 1 by mouth daily for blood pressure/fluid  Orders: T-Lipid Profile (04540-98119) T-Comprehensive Metabolic Panel [phone]) T-CBC w/Diff (30865-78469) Rapid HIV  (62952) T-Syphilis Test (RPR) (84132-44010) T-TSH (27253-66440)  Problem # 2:  GERD (ICD-530.81) symptoms are stable continue current meds Her updated medication list for this problem includes:    Nexium 40 Mg Cpdr (Esomeprazole magnesium) .Marland Kitchen... 1 tablet by mouth two times a day for stomach  Problem # 3:  ANXIETY (ICD-300.00) pt to continue current meds Her updated medication list for this problem includes:    Lexapro 10 Mg Tabs (  Escitalopram oxalate) .Marland Kitchen... 1 tablet by mouth daily for mood  Problem # 4:  UNSPECIFIED BREAST SCREENING (ICD-V76.10) will scheduled today Orders: Mammogram (Screening) (Mammo)  Problem # 5:  NEED PROPHYLACTIC VACCINATION&INOCULATION FLU (ICD-V04.81) given today in office  Complete Medication List: 1)  Maxzide-25 37.5-25 Mg Tabs (Triamterene-hctz) .Marland Kitchen.. 1 by mouth daily for blood pressure/fluid 2)  Nexium 40 Mg Cpdr (Esomeprazole magnesium) .Marland Kitchen.. 1 tablet by mouth two times a day for stomach 3)  K-tabs 10 Meq Tbcr  (Potassium chloride) .Marland Kitchen.. 1 tablet by mouth daily 4)  Fibercon 625 Mg Tabs (Calcium polycarbophil) .Marland Kitchen.. 1 tablet by mouth two times a day 5)  Lexapro 10 Mg Tabs (Escitalopram oxalate) .Marland Kitchen.. 1 tablet by mouth daily for mood 6)  Multivitamins Tabs (Multiple vitamin) .Marland Kitchen.. 1 tablet by mouth daily  Hypertension Assessment/Plan:      The patient's hypertensive risk group is category B: At least one risk factor (excluding diabetes) with no target organ damage.  Her calculated 10 year risk of coronary heart disease is 8 %.  Today's blood pressure is 126/78.  Her blood pressure goal is < 140/90.  Patient Instructions: 1)  You have received the flu vaccine today. 2)  You will be notified of the lab results 3)  Keep appointment for mammogram. 4)  Call for a follow up at least every 6 months for high blood pressure   Orders Added: 1)  T-Lipid Profile [80061-22930] 2)  T-Comprehensive Metabolic Panel [80053-22900] 3)  T-CBC w/Diff [24401-02725] 4)  Rapid HIV  [92370] 5)  T-Syphilis Test (RPR) [36644-03474] 6)  T-TSH [25956-38756] 7)  Est. Patient Level IV [43329] 8)  Mammogram (Screening) [Mammo]  Appended Document: Lab Order    Lab Visit  Laboratory Results  Date/Time Received: July 01, 2010 10:43 AM   Other Tests  Rapid HIV: negative  Orders Today:

## 2010-08-11 NOTE — Letter (Signed)
Summary: Generic Letter  [facility]  [address] [place], [place] [address]   Phone: [phone]  Fax: [phone]    07/18/2010  Phylis Ades [address] [place], [place]  [address]  Dear Ms. Gunther,  We have been unable to contact you by telephone.  Please call our office, at your earliest convenience, so that we may speak with you regarding your recent phone call to [place].   Sincerely,   Dutch Quint RN

## 2010-08-11 NOTE — Progress Notes (Signed)
Summary: Coughing and sore neck  Phone Note Call from Patient Call back at [facility] Phone [phone]   Summary of Call: PT SAYS HER EYES ARE WATERY AND BURNING... PT SAYS HER NECK IS SORE ONLY ON THE RIGHT SIDE... PT SAYS SHE HAS A MAMMOGRAM TOMORROW AND SHE DOESN'T FEEL LIKE GOING. PT DENIES NAUSEA AND VOMTING... PT SAYS SHE DID COUGH UP A LITTLE BLOOD IN HER MUCUS PLEASE, CALL HER DAUGHTER JAMIE  [phone] THANK YOU  Initial call taken by: [place] Shannon,  July 12, 2010 12:13 PM  Follow-up for Phone Call        Called daughter's number at office -- no one picked up.  Left message on answering machine for pt. to return call.  Dutch Quint RN  July 12, 2010 4:34 PM   Additional Follow-up for Phone Call Additional follow up Details #1::        MS Kahler RETURNED YOUR CALL, SHE SAYS THAT SHE IS AT HOME NOW AND YOU CAN CALL HER AT [phone].  Left message on answering machine for pt. to return call.  Dutch Quint RN  July 13, 2010 12:31 PM  Left message on answer machine for pt. to return call. Gaylyn Cheers RN  July 14, 2010 9:50 AM  Left message on answer machine for pt. to return call. Gaylyn Cheers RN  July 15, 2010 3:11 PM  Left message on answering machine for pt. to return call.  Letter sent.  Dutch Quint RN  July 18, 2010 9:20 AM          Thalia Party

## 2010-08-11 NOTE — Letter (Signed)
Summary: Lipid Letter  [facility]  [address] [place], [place] [address]   Phone: [phone]  Fax: [phone]    07/01/2010  Lorine Iannaccone [address] [place], [place]  [address]  Dear Gunnar Fusi:  We have carefully reviewed your last lipid profile from 06/30/2010 and the results are noted below with a summary of recommendations for lipid management.    Cholesterol:       182     Goal: less than 200   HDL "good" Cholesterol:   40     Goal: greater than 40   LDL "bad" Cholesterol:   109     Goal: less than 100   Triglycerides:       166     Goal: less than 150    Labs done during recent office visit are normal except your triglycerides are slightly elevated.  You should continue to eat baked foods. No fried, spicy or fatty foods. Your potassium is normal.     Current Medications: 1)    Maxzide-25 37.5-25 Mg  Tabs (Triamterene-hctz) .Marland Kitchen.. 1 by mouth daily for blood pressure/fluid 2)    Nexium 40 Mg  Cpdr (Esomeprazole magnesium) .Marland Kitchen.. 1 tablet by mouth two times a day for stomach 3)    K-tabs 10 Meq  Tbcr (Potassium chloride) .Marland Kitchen.. 1 tablet by mouth daily 4)    Fibercon 625 Mg  Tabs (Calcium polycarbophil) .Marland Kitchen.. 1 tablet by mouth two times a day 5)    Lexapro 10 Mg Tabs (Escitalopram oxalate) .Marland Kitchen.. 1 tablet by mouth daily for mood 6)    Multivitamins  Tabs (Multiple vitamin) .Marland Kitchen.. 1 tablet by mouth daily  If you have any questions, please call. We appreciate being able to work with you.   Sincerely,    [facility] Lehman Prom FNP

## 2010-09-16 ENCOUNTER — Encounter: Payer: Self-pay | Admitting: Nurse Practitioner

## 2010-09-16 ENCOUNTER — Encounter (INDEPENDENT_AMBULATORY_CARE_PROVIDER_SITE_OTHER): Payer: Self-pay | Admitting: Nurse Practitioner

## 2010-09-16 LAB — CONVERTED CEMR LAB: Blood Glucose, Fingerstick: 78

## 2010-09-20 LAB — DIFFERENTIAL
Basophils Absolute: 0.1 10*3/uL (ref 0.0–0.1)
Basophils Relative: 2 % — ABNORMAL HIGH (ref 0–1)
Eosinophils Relative: 2 % (ref 0–5)
Monocytes Absolute: 0.3 10*3/uL (ref 0.1–1.0)
Neutro Abs: 3.5 10*3/uL (ref 1.7–7.7)

## 2010-09-20 LAB — BASIC METABOLIC PANEL
BUN: 16 mg/dL (ref 6–23)
Creatinine, Ser: 0.7 mg/dL (ref 0.4–1.2)
GFR calc non Af Amer: >60 mL/min (ref >60)
Glucose, Bld: 88 mg/dL (ref 70–99)
Potassium: 3.6 mEq/L (ref 3.5–5.1)

## 2010-09-20 LAB — CBC
HCT: 38.4 % (ref 36.0–46.0)
MCHC: 34.5 g/dL (ref 30.0–36.0)
MCV: 91 fL (ref 78.0–100.0)
Platelets: 267 10*3/uL (ref 150–400)
RDW: 11.9 % (ref 11.5–15.5)
WBC: 7 10*3/uL (ref 4.0–10.5)

## 2010-09-20 LAB — POCT CARDIAC MARKERS

## 2010-09-20 NOTE — Letter (Signed)
Summary: Handout Printed  Printed Handout:  - Tinea Pedis (Athlete's Foot)-Brief

## 2010-09-20 NOTE — Assessment & Plan Note (Signed)
Summary: Tinea Pedis   Vital Signs:  Patient profile:   55 year old female Menstrual status:  postmenopausal Weight:      160.0 pounds BMI:     31.89 Temp:     97.3 degrees F oral Pulse rate:   72 / minute Pulse rhythm:   regular Resp:     16 per minute BP sitting:   145 / 87  (left arm) Cuff size:   regular  Vitals Entered By: Levon Hedger (September 16, 2010 3:12 PM)  Nutrition Counseling: Patient's BMI is greater than 25 and therefore counseled on weight management options. CC: toes sore, swollen and itching alot x 1 week, Hypertension Management Is Patient Diabetic? No Pain Assessment Patient in pain? no      CBG Result 78  Does patient need assistance? Functional Status Self care Ambulation Normal   Primary Care Provider:  [facility]  CC:  toes sore, swollen and itching alot x 1 week, and Hypertension Management.  History of Present Illness:  Pt into the office for f/u for follow up. Pt has returned with problems in her feet. This has been a problem in the past and she used cream. Pt reprots that she has been drying between her toes as ordered. pt reports that she has been using a clean dry sock to itch between her toes  Hypertension History:      She denies headache, chest pain, and palpitations.  She notes no problems with any antihypertensive medication side effects.  Pt is taking meds as ordered.        Positive major cardiovascular risk factors include female age 27 years old or older and hypertension.  Negative major cardiovascular risk factors include non-tobacco-user status.        Further assessment for target organ damage reveals no history of ASHD, cardiac end-organ damage (CHF/LVH), stroke/TIA, peripheral vascular disease, renal insufficiency, or hypertensive retinopathy.    Habits & Providers  Alcohol-Tobacco-Diet     Alcohol drinks/day: 0     Tobacco Status: never  Exercise-Depression-Behavior     Does Patient Exercise: no     Depression  Counseling: not indicated; screening negative for depression     Drug Use: no     Seat Belt Use: 0     Sun Exposure: occasionally  Allergies (verified): 1)  ! Ibuprofen  Review of Systems General:  Denies fever. CV:  Denies fatigue. Resp:  Denies cough. GI:  Denies abdominal pain, nausea, and vomiting. Derm:  Complains of itching and lesion(s).  Physical Exam  General:  alert.   Head:  atraumatic.   Eyes:  glasses Ears:  ear piercing(s) noted.   Lungs:  normal breath sounds.   Heart:  normal rate and regular rhythm.   Abdomen:  normal bowel sounds.   Neurologic:  alert & oriented X3.   Skin:  interdigital with extreme excoriation - sloughing white skin and irritation Psych:  Oriented X3.     Impression & Recommendations:  Problem # 1:  TINEA PEDIS (ICD-110.4)  advised pt of the dx  Her updated medication list for this problem includes:    Terbinafine Hcl 250 Mg Tabs (Terbinafine hcl) ..... One tablet by mouth two times a day for feet    Lotrisone 1-0.05 % Crea (Clotrimazole-betamethasone) ..... One application topically two times a day to affected area until healed  Problem # 2:  ESSENTIAL HYPERTENSION, BENIGN (ICD-401.1) BP is slightly elevated today DASH diet Her updated medication list for this problem includes:  Maxzide-25 37.5-25 Mg Tabs (Triamterene-hctz) .Marland Kitchen... 1 by mouth daily for blood pressure/fluid  Complete Medication List: 1)  Maxzide-25 37.5-25 Mg Tabs (Triamterene-hctz) .Marland Kitchen.. 1 by mouth daily for blood pressure/fluid 2)  Nexium 40 Mg Cpdr (Esomeprazole magnesium) .Marland Kitchen.. 1 tablet by mouth two times a day for stomach 3)  K-tabs 10 Meq Tbcr (Potassium chloride) .Marland Kitchen.. 1 tablet by mouth daily 4)  Fibercon 625 Mg Tabs (Calcium polycarbophil) .Marland Kitchen.. 1 tablet by mouth two times a day 5)  Lexapro 10 Mg Tabs (Escitalopram oxalate) .Marland Kitchen.. 1 tablet by mouth daily for mood 6)  Multivitamins Tabs (Multiple vitamin) .Marland Kitchen.. 1 tablet by mouth daily 7)  Terbinafine Hcl 250 Mg  Tabs (Terbinafine hcl) .... One tablet by mouth two times a day for feet 8)  Lotrisone 1-0.05 % Crea (Clotrimazole-betamethasone) .... One application topically two times a day to affected area until healed  Hypertension Assessment/Plan:      The patient's hypertensive risk group is category B: At least one risk factor (excluding diabetes) with no target organ damage.  Her calculated 10 year risk of coronary heart disease is 15 %.  Today's blood pressure is 145/87.  Her blood pressure goal is < 140/90.   Patient Instructions: 1)  You still have a foot fungus. That makes your feet itch.   2)  You can take terbinafine 250mg  by mouth two times a day for your feet (should be on [facility]'s $4 plan) 3)  Also use lotrisone cream two times a day (will need to get from [facility]) 4)  Be sure to use BOTH the cream and the pill for improvement Prescriptions: LOTRISONE 1-0.05 % CREA (CLOTRIMAZOLE-BETAMETHASONE) One application topically two times a day to affected area until healed  #30gm x 0   Entered and Authorized by:   Lehman Prom FNP   Signed by:   Lehman Prom FNP on 09/16/2010   Method used:   Print then Give to Patient   RxID:   [id number] TERBINAFINE HCL 250 MG TABS (TERBINAFINE HCL) One tablet by mouth two times a day for feet  #30 x 0   Entered and Authorized by:   Lehman Prom FNP   Signed by:   Lehman Prom FNP on 09/16/2010   Method used:   Print then Give to Patient   RxID:   [id number]    Orders Added: 1)  Est. Patient Level III [64332]

## 2010-09-27 NOTE — Progress Notes (Signed)
Summary: Office Visit//HEALTH SCREEN  Office Visit//HEALTH SCREEN   Imported By: Arta Bruce 09/19/2010 10:20:59  _____________________________________________________________________  External Attachment:    Type:   Image     Comment:   External Document

## 2010-11-25 NOTE — Consult Note (Signed)
[name], LITTLE                 ACCOUNT NO.:  [id number]   MEDICAL RECORD NO.:  [id number]          PATIENT TYPE:  INP   LOCATION:  6711                         FACILITY:  [facility]   PHYSICIAN:  Jonelle Sidle, M.D. LHCDATE OF BIRTH:  1955-08-21   DATE OF CONSULTATION:  12/20/2005  DATE OF DISCHARGE:                                   CONSULTATION   PRIMARY CARE PHYSICIAN:  [facility].   REASON FOR CONSULTATION:  Chest pain.   HISTORY OF PRESENT ILLNESS:  [name] is a very pleasant 55 year old  African-American female with no known history of coronary artery disease who  has had multiple emergency room visits here in [place], [place] for  atypical chest pain. She states that she has always been told previously  that it is GERD, however, she states that the medication does not seem to  relieve the discomfort. This episode began yesterday morning. 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 She was doing some line  work, cutting up boxes, and states that she had a sudden onset of chest  discomfort above her left breast. She states that it took her breath away.  The sharp pain lasted around 3 to 4 hours while she was at work. When she  was finished this morning, she came to the emergency room for evaluation.  She states that she was given a GI cocktail without any relief. Morphine did  not relieve it. She states that her discomfort finally was relieved with  Toradol this morning. She states that she eventually was able to sleep.  Currently, she is complaining of some chest tightness in her left breast.  She states that this is not anything like her usual GERD discomfort. She is  rating it a 6 currently on a scale of 1 to 10. She denies any shortness of  breath, nausea, vomiting, light headedness, or dizziness. She is complaining  of some diaphoresis and flushing. She states the chest discomfort radiates  into her left axillary and up into her left shoulder,  mildly.   ALLERGIES:  NO KNOWN DRUG ALLERGIES.   HOME MEDICATIONS:  Include HCTZ and Nexium. The Nexium is b.i.d.   CURRENT MEDICATIONS:  Here she is on HCTZ 25, K-Dur 20, Toradol p.r.n.,  Protonix, Lovenox 40 mg SQ q.24 hours, and Cardizem 180 mg daily.   PAST MEDICAL HISTORY:  1.  Hypertension x2 plus years.  2.  GERD with a small hiatal hernia.  The patient denies any other problems such as diabetes, thyroid problems,  cholesterol.   PAST SURGICAL HISTORY:  Only surgery history includes a toe surgery in the  past.   SOCIAL HISTORY:  She lives here in [place] with her daughter. She is  divorced. She works third shift for [employer] as an Midwife.  Denies any history of tobacco, ETOH use, drug or herbal medication use. She  tries to follow a regular diet. She avoids spicy foods.   FAMILY HISTORY:  Mother alive with hypertension. Father deceased age 43  secondary to COPD complications. Possible questionable history  of CAD.  Siblings, she has a brother who has seizures and a sister with hypertension.   REVIEW OF SYSTEMS:  Positive for diaphoresis, headache, chest pain as  described above, joint swelling in the left knee, and GERD symptoms. All  other systems negative per patient.   PHYSICAL EXAMINATION:  VITAL SIGNS:  Temperature 97.5, pulse 58, respiratory  rate 20, blood pressure 115/79. Sating 100% on room air. Weight 145.8  pounds.  GENERAL:  No acute distress. Very pleasant, cooperative, African-American  female.  HEENT:  Pupils are equal, round, and reactive to light. Sclerae clear.  Normocephalic and atraumatic.  NECK:  Supple without lymphadenopathy. Negative bruit or JVD.  CARDIOVASCULAR:  Heart rate regular rhythm. S1 and S2 without murmurs, rubs,  or gallops. Pulses are 2+ and equal without bruits.  LUNGS:  Clear to auscultation bilaterally.  ABDOMEN:  Soft, nontender. Positive bowel sounds.  EXTREMITIES:  Without clubbing, cyanosis, or edema. She has  2+ DP's  bilateral.  NEUROLOGIC:  Alert and oriented times three. Cranial nerves 2-12 are grossly  intact.   LABORATORY DATA:  Chest x-ray without acute findings.   EKG at a rate of 59, sinus rhythm. The patient has some non-specific ST  inversions in V2 and V3. These changes were not on an EKG dated September  2005. She has some very mild ST elevation in V4 through V6, less than 1 mm.   WBC 5.8. Hemoglobin and hematocrit 12 and 36 with a platelet count of  301,000. Sodium 142, potassium 3.7, chloride 105, CO2 26, BUN 12, creatinine  0.8 with glucose of 81. Cardiac enzymes:  Troponin less than 0.03 X3. TSH  1.279. D-dimer is 0.27. Mag level 2.3. Drugs positive for opiates only. The  patient was seen recently for knee pain and was given a prescription for  Vicodin. Total cholesterol 165, triglycerides 103, LDL 102, HDL 42.   IMPRESSION/PLAN:  The patient presents with chest discomfort with negative  cardiac enzymes x3. EKG with non-specific T wave inversions, which is a new  finding. Negative D-dimer. Blood pressure well controlled. Dr. Simona Huh  in to examine patient. He discussed with patient, the risks and benefits of  non-invasive stress test versus cardiac catheterization.  At this time, the  patient wishes to proceed with diagnostic heart catheterization to clarify  etiology of discomfort, whether it be cardiac or not. Risks and benefits  have been discussed with the patient. The patient agrees to proceed. The  patient has been scheduled for a cardiac catheterization in the a.m. with  Dr. Gala Romney. Agree with continuing with Lovenox, aspirin. The patient's  chest pain should be treated as cardiac until further clarification. May  transfer to telemetry bed if bed available. Treat chest pain as cardiac,  i.e. nitroglycerin, morphine, EKG.      Dorian Pod, NP      Jonelle Sidle, M.D. [facility]  Electronically Signed   MB/MEDQ  D:  12/20/2005  T:  12/20/2005   Job:  [id number]

## 2010-11-25 NOTE — Discharge Summary (Signed)
[name], [name]                 ACCOUNT NO.:  [id number]   MEDICAL RECORD NO.:  [id number]          PATIENT TYPE:  INP   LOCATION:  2852                         FACILITY:  [facility]   PHYSICIAN:  Duncan Dull, M.D.     DATE OF BIRTH:  21-Sep-1955   DATE OF ADMISSION:  12/19/2005  DATE OF DISCHARGE:  12/21/2005                                 DISCHARGE SUMMARY   DISCHARGE DIAGNOSES:  1.  Noncardiac chest pain.  2.  Gastroesophageal reflux disease.  3.  Hypertension.   DISCHARGE MEDICATIONS:  1.  Nexium 40 mg p.o. b.i.d.  2.  Hydrochlorothiazide 25 mg p.o. daily.   DISPOSITION:  The patient is discharged home with hospital followup by Dr.  Luciana Axe at [facility] on December 26, 2005 at 10:30.   PROCEDURES:  1.  Chest x-ray on December 19, 2005.  Impression:  No evidence of active chest      disease.  2.  Cardiac catheterization on December 21, 2005 that showed no coronary artery      disease and ejection fraction of 67 to 70%.   CONSULTANT:  Arvilla Meres, M.D. [facility])   HISTORY OF PRESENT ILLNESS:  A 55 year old African-American woman with past  medical history significant for hypertension and GERD followed at  [facility] presented to the emergency room with sudden onset of substernal  chest pain which she described as sharp stabbing pain, non radiating, not  associated with shortness of breath, not positional, not reproducible.  Pain  started this a.m. at home and lasted on-and-off for a few hours.  Upon my  exam, patient has been still uncomfortable.  She describes similar chest  pain in the past, but not so severe.  EKG has been normal.  Two years ago,  she was started on Protonix and then she was changed to Nexium.   LABS ON ADMISSION:  D-dimer 0.27, sodium 139, potassium 3.2, chloride 104,  bicarb 31, BUN 11, creatinine 0.9, glucose 101, white blood cell count 7.0,  hemoglobin 12, hematocrit 36.3, MCV 86.6, platelets 295,000, TSH 1.279.  Drug screen was negative.   Lipid profile:  Cholesterol 165, triglycerides  103, HDL 42, LDL 102.  Cardiac enzymes x3 completely normal.   HOSPITAL COURSE:  1.  Noncardiac chest pain.  The patient was admitted to telemetry bed for      cardiac monitoring, serial enzymes.  She ruled out for AMI with negative      enzymes; however repeat EKG was concerning for new TWIs rin V2.  she      underwent cardiac catheterization which ruled out significant CAD.  The      patient was pain free at time of discharge and should undergo additional      testinf for esophageal causes by her primary cared doctor if the pain      returns.  .  2.  GERD.  She was treated with Protonix, no further problems from this      point of view.  3.  Hypertension.  She remained normotensive during this hospitalization.      She  was continued on hydrochlorothiazide.  Initially, she was      hypokalemic and that was repleted.  For the follow-up with Dr. Luciana Axe,      she needs to get a BMET to check on her potassium.      Dennis Bast, MD      Duncan Dull, M.D.  Electronically Signed    YC/MEDQ  D:  12/21/2005  T:  12/21/2005  Job:  [id number]   cc:   Jillyn Hidden A. Rankin, M.D.  Fax: [phone]

## 2010-11-25 NOTE — Cardiovascular Report (Signed)
[name], [name]                 ACCOUNT NO.:  [id number]   MEDICAL RECORD NO.:  [id number]           PATIENT TYPE:   LOCATION:                                 FACILITY:   PHYSICIAN:  Arvilla Meres, M.D. [facility]  DATE OF BIRTH:   DATE OF PROCEDURE:  12/21/2005  DATE OF DISCHARGE:                              CARDIAC CATHETERIZATION   PRIMARY CARE:  [facility]   CARDIOLOGIST:  Jonelle Sidle, M.D.   PATIENT IDENTIFICATION:  Patient is a 55 year old woman with a history of  hypertension.  She has had multiple admissions for chest pain.  She was,  again, admitted with chest pain.  She has ruled out for myocardial  infarction with serial cardiac enzymes.  However, she did have some anterior  T wave inversions; and she is thus referred for diagnostic catheterization.   PROCEDURES PERFORMED:  1.  Selective coronary angiography.  2.  Left heart catheterization.  3.  Left ventriculogram.  4.  AngioSeal femoral closure.   DESCRIPTION OF PROCEDURE:  The risks and benefits of the catheterization  were reviewed.  Consent was signed and placed on the chart.  A 6-French  arterial sheath was placed in the right femoral artery using a modified  Seldinger technique.  Standard catheters including JL-4, JR-4, and angled  pigtail were used for catheterization.  All catheter exchanges made over a  wire.  There are no apparent complications.   The central aortic pressure is 118/74 with a mean of 96.  LV pressure was  119/0 with a mean LVEDP of 7.  There is no aortic stenosis.   FINDINGS:  1.  Left main was normal.  2.  LAD was a long vessel coursing the apex.  It gave off 2 diagonals.      There was no angiographic CAD.  3.  Left circumflex was a moderate system made up primarily of OM-1.  It is      angiographically normal.  4.  Right coronary artery was a dominant vessel and gave off a PDA and a      moderate-sized PL.  Initially there was catheter induced spasm of the      proximal  vessel.  We then switched out for a 4-French catheter; and the      artery was normal.  There was no angiographic CAD.   LEFT VENTRICULOGRAM:  Done in the RAO position showed an EF of 65-70%.  No  wall motion abnormalities or mitral regurgitation.   ASSESSMENT:  1.  Normal coronary arteries.  2.  Normal LV function.   PLAN/DISCUSSION:  I suspect she has noncardiac chest pain.  I would continue  risk factor modification and consider a possible GI workup.  She will be  discharged home today after her bedrest so long as her groin remains stable.      Arvilla Meres, M.D. [facility]  Electronically Signed     DB/MEDQ  D:  12/21/2005  T:  12/21/2005  Job:  [id number]

## 2011-04-03 LAB — DIFFERENTIAL
Eosinophils Absolute: 0.1
Lymphocytes Relative: 35
Lymphs Abs: 2.4
Neutro Abs: 3.9
Neutrophils Relative %: 57

## 2011-04-03 LAB — COMPREHENSIVE METABOLIC PANEL
BUN: 14
CO2: 30
Calcium: 9.7
Creatinine, Ser: 0.81
GFR calc non Af Amer: >60
Glucose, Bld: 96

## 2011-04-03 LAB — CBC
Hemoglobin: 13.3
MCHC: 34.3
MCV: 89.2
RBC: 4.33
RDW: 12.3

## 2011-04-03 LAB — POCT CARDIAC MARKERS
Myoglobin, poc: 33.3
Operator id: 1415
Troponin i, poc: <0.05

## 2011-04-03 LAB — LIPASE, BLOOD: Lipase: 24

## 2011-04-04 LAB — URINALYSIS, ROUTINE W REFLEX MICROSCOPIC
Bilirubin Urine: NEGATIVE
Glucose, UA: NEGATIVE
Ketones, ur: NEGATIVE
Protein, ur: NEGATIVE
Urobilinogen, UA: 0.2

## 2011-04-04 LAB — POCT CARDIAC MARKERS
CKMB, poc: <1 — ABNORMAL LOW
Myoglobin, poc: 46.5
Operator id: 1192
Troponin i, poc: <0.05

## 2011-04-04 LAB — DIFFERENTIAL
Basophils Absolute: 0
Eosinophils Absolute: 0
Lymphs Abs: 1.4
Monocytes Absolute: 0.7
Monocytes Relative: 13 — ABNORMAL HIGH
Neutro Abs: 3.6

## 2011-04-04 LAB — COMPREHENSIVE METABOLIC PANEL
AST: 26
Albumin: 3.9
Alkaline Phosphatase: 60
BUN: 7
Calcium: 9.4
GFR calc non Af Amer: 53 — ABNORMAL LOW
Glucose, Bld: 90
Sodium: 139
Total Bilirubin: 0.3

## 2011-04-04 LAB — URINE MICROSCOPIC-ADD ON

## 2011-04-04 LAB — CBC
Hemoglobin: 13.3
MCHC: 33.5
RDW: 12

## 2011-04-06 LAB — BASIC METABOLIC PANEL
BUN: 13
Creatinine, Ser: 0.67
GFR calc non Af Amer: >60
Glucose, Bld: 90
Potassium: 3.3 — ABNORMAL LOW

## 2011-04-06 LAB — POCT CARDIAC MARKERS
CKMB, poc: 1
Myoglobin, poc: 68.3
Operator id: 173591
Troponin i, poc: <0.05

## 2011-04-06 LAB — DIFFERENTIAL
Basophils Absolute: 0
Basophils Relative: 0
Eosinophils Absolute: 0.2
Eosinophils Relative: 3
Lymphocytes Relative: 36
Lymphs Abs: 2.8
Monocytes Absolute: 0.3
Monocytes Relative: 3
Neutro Abs: 4.5
Neutrophils Relative %: 58

## 2011-04-06 LAB — CBC
HCT: 38.2
MCV: 90.7
Platelets: 252
RDW: 12.7

## 2011-04-07 LAB — DIFFERENTIAL
Basophils Absolute: 0
Basophils Relative: 0
Eosinophils Absolute: 0.2
Eosinophils Relative: 2
Monocytes Absolute: 0.4
Neutro Abs: 3.5

## 2011-04-07 LAB — COMPREHENSIVE METABOLIC PANEL
ALT: 19
AST: 23
Albumin: 3.7
Alkaline Phosphatase: 66
BUN: 10
Chloride: 101
Potassium: 3.3 — ABNORMAL LOW
Total Bilirubin: 0.6

## 2011-04-07 LAB — URINALYSIS, ROUTINE W REFLEX MICROSCOPIC
Glucose, UA: NEGATIVE
Hgb urine dipstick: NEGATIVE
Specific Gravity, Urine: 1.01
pH: 7

## 2011-04-07 LAB — POCT CARDIAC MARKERS
CKMB, poc: 1.5
Troponin i, poc: <0.05

## 2011-04-07 LAB — URINE MICROSCOPIC-ADD ON

## 2011-04-07 LAB — CBC
HCT: 36
Platelets: 273
WBC: 6.3

## 2011-04-14 LAB — URINALYSIS, ROUTINE W REFLEX MICROSCOPIC
Bilirubin Urine: NEGATIVE
Hgb urine dipstick: NEGATIVE
Ketones, ur: NEGATIVE mg/dL
Nitrite: NEGATIVE
Urobilinogen, UA: 1 mg/dL (ref 0.0–1.0)
pH: 7.5 (ref 5.0–8.0)

## 2011-04-14 LAB — CBC
Hemoglobin: 13.7 g/dL (ref 12.0–15.0)
MCHC: 32.5 g/dL (ref 30.0–36.0)
MCV: 91.9 fL (ref 78.0–100.0)
RDW: 12.6 % (ref 11.5–15.5)

## 2011-04-14 LAB — POCT I-STAT, CHEM 8
Calcium, Ion: 1.15 mmol/L (ref 1.12–1.32)
Chloride: 105 mEq/L (ref 96–112)
Glucose, Bld: 79 mg/dL (ref 70–99)
HCT: 43 % (ref 36.0–46.0)
Hemoglobin: 14.6 g/dL (ref 12.0–15.0)
TCO2: 28 mmol/L (ref 0–100)

## 2011-04-14 LAB — POCT CARDIAC MARKERS: Troponin i, poc: <0.05 ng/mL (ref 0.00–0.09)

## 2011-04-14 LAB — DIFFERENTIAL
Basophils Absolute: 0.1 10*3/uL (ref 0.0–0.1)
Basophils Relative: 2 % — ABNORMAL HIGH (ref 0–1)
Eosinophils Absolute: 0.2 10*3/uL (ref 0.0–0.7)
Monocytes Absolute: 0.4 10*3/uL (ref 0.1–1.0)
Neutro Abs: 3.6 10*3/uL (ref 1.7–7.7)

## 2011-04-20 LAB — I-STAT 8, (EC8 V) (CONVERTED LAB)
Acid-Base Excess: 3 — ABNORMAL HIGH
Acid-Base Excess: 6 — ABNORMAL HIGH
BUN: 20
Bicarbonate: 28 — ABNORMAL HIGH
Bicarbonate: 30.7 — ABNORMAL HIGH
Chloride: 102
Glucose, Bld: 124 — ABNORMAL HIGH
Glucose, Bld: 101 — ABNORMAL HIGH
HCT: 44
Hemoglobin: 15
Operator id: 272551
Potassium: 3 — ABNORMAL LOW
Sodium: 137
TCO2: 29
TCO2: 32
pCO2, Ven: 44.1 — ABNORMAL LOW
pCO2, Ven: 44.9 — ABNORMAL LOW
pH, Ven: 7.41 — ABNORMAL HIGH
pH, Ven: 7.442 — ABNORMAL HIGH

## 2011-04-20 LAB — POCT I-STAT CREATININE
Creatinine, Ser: 0.9
Creatinine, Ser: 1.1
Operator id: 272551

## 2011-04-20 LAB — URINALYSIS, ROUTINE W REFLEX MICROSCOPIC
Bilirubin Urine: NEGATIVE
Glucose, UA: NEGATIVE
Ketones, ur: NEGATIVE
Nitrite: NEGATIVE
Specific Gravity, Urine: 1.012
pH: 7

## 2011-04-20 LAB — POCT CARDIAC MARKERS
CKMB, poc: 1.1
Myoglobin, poc: 78.9
Operator id: 272551
Troponin i, poc: <0.05

## 2011-04-20 LAB — CBC
MCHC: 33.9
MCV: 91.3
Platelets: 307
RBC: 4.35
WBC: 9.1

## 2011-04-20 LAB — DIFFERENTIAL
Basophils Relative: 1
Eosinophils Absolute: 0
Neutro Abs: 5.8
Neutrophils Relative %: 63

## 2011-04-20 LAB — URINE MICROSCOPIC-ADD ON

## 2011-04-21 LAB — URINE CULTURE: Colony Count: >=100000

## 2011-04-21 LAB — DIFFERENTIAL
Basophils Absolute: 0
Basophils Relative: 1
Eosinophils Relative: 1
Lymphocytes Relative: 28
Monocytes Absolute: 0.4

## 2011-04-21 LAB — CULTURE, BLOOD (ROUTINE X 2): Culture: NO GROWTH

## 2011-04-21 LAB — I-STAT 8, (EC8 V) (CONVERTED LAB)
Acid-Base Excess: 7 — ABNORMAL HIGH
BUN: 21
Chloride: 102
Glucose, Bld: 108 — ABNORMAL HIGH
Potassium: 3.5
pCO2, Ven: 42 — ABNORMAL LOW
pH, Ven: 7.476 — ABNORMAL HIGH

## 2011-04-21 LAB — CBC
HCT: 41.9
MCHC: 35
Platelets: 294
RDW: 12

## 2011-04-21 LAB — URINALYSIS, ROUTINE W REFLEX MICROSCOPIC
Bilirubin Urine: NEGATIVE
Ketones, ur: NEGATIVE
Nitrite: NEGATIVE
Protein, ur: NEGATIVE

## 2011-04-21 LAB — POCT I-STAT CREATININE
Creatinine, Ser: 1.2
Operator id: 279831

## 2011-04-26 LAB — I-STAT 8, (EC8 V) (CONVERTED LAB)
BUN: 18
Bicarbonate: 29.1 — ABNORMAL HIGH
Chloride: 105
Glucose, Bld: 106 — ABNORMAL HIGH
HCT: 40
Hemoglobin: 13.6
Operator id: 146091
Potassium: 3.7
Sodium: 139

## 2011-04-26 LAB — POCT CARDIAC MARKERS
CKMB, poc: <1 — ABNORMAL LOW
Myoglobin, poc: 51

## 2011-04-26 LAB — POCT I-STAT CREATININE: Creatinine, Ser: 0.8

## 2011-04-26 LAB — D-DIMER, QUANTITATIVE: D-Dimer, Quant: <0.22

## 2011-04-27 LAB — I-STAT 8, (EC8 V) (CONVERTED LAB)
Acid-Base Excess: 9 — ABNORMAL HIGH
Bicarbonate: 33.2 — ABNORMAL HIGH
HCT: 40
Hemoglobin: 13.6
Operator id: 208821
Potassium: 2.8 — ABNORMAL LOW
Sodium: 139
TCO2: 35

## 2011-04-27 LAB — CBC
HCT: 36.4
Hemoglobin: 12.4
MCHC: 34
RBC: 4.01
RDW: 12.3

## 2011-04-27 LAB — DIFFERENTIAL
Basophils Relative: 0
Eosinophils Relative: 2
Lymphocytes Relative: 32
Monocytes Absolute: 0.4
Monocytes Relative: 6
Neutro Abs: 4.3

## 2011-04-27 LAB — POCT CARDIAC MARKERS
CKMB, poc: <1 — ABNORMAL LOW
Myoglobin, poc: 52.2

## 2011-10-30 ENCOUNTER — Emergency Department (INDEPENDENT_AMBULATORY_CARE_PROVIDER_SITE_OTHER)
Admission: EM | Admit: 2011-10-30 | Discharge: 2011-10-30 | Disposition: A | Discharge status: Home / Self Care | Payer: Self-pay | Source: Home / Self Care | Attending: Family Medicine | Admitting: Family Medicine

## 2011-10-30 ENCOUNTER — Emergency Department (INDEPENDENT_AMBULATORY_CARE_PROVIDER_SITE_OTHER): Payer: Self-pay

## 2011-10-30 ENCOUNTER — Encounter (HOSPITAL_COMMUNITY): Payer: Self-pay

## 2011-10-30 DIAGNOSIS — J069 Acute upper respiratory infection, unspecified: Secondary | ICD-10-CM

## 2011-10-30 HISTORY — DX: Essential (primary) hypertension: I10

## 2011-10-30 HISTORY — DX: Gastro-esophageal reflux disease without esophagitis: K21.9

## 2011-10-30 MED ORDER — HYDROCODONE-ACETAMINOPHEN 5-325 MG PO TABS
1.0000 | ORAL_TABLET | Freq: Once | ORAL | Status: AC
  Administered 2011-10-30: 1 via ORAL

## 2011-10-30 MED ORDER — DEXTROMETHORPHAN-GUAIFENESIN 10-300 MG/5ML PO LIQD: 5.0000 mL | Freq: Four times a day (QID) | ORAL | Status: DC | PRN

## 2011-10-30 MED ORDER — HYDROCODONE-ACETAMINOPHEN 5-325 MG PO TABS
ORAL_TABLET | ORAL | Status: AC
  Filled 2011-10-30: qty 1

## 2011-10-30 MED ORDER — BENZONATATE 100 MG PO CAPS: 100.0000 mg | ORAL_CAPSULE | Freq: Three times a day (TID) | ORAL | Status: AC

## 2011-10-30 MED ORDER — ACETAMINOPHEN-CODEINE #3 300-30 MG PO TABS: 1.0000 | ORAL_TABLET | Freq: Four times a day (QID) | ORAL | Status: DC | PRN

## 2011-10-30 NOTE — Discharge Instructions (Signed)
Is likely you have a viral infection Is very important top keep well hydrated. Take the prescribed medications as instructed. Can take over-the-counter ibuprofen every 8 hours scheduled for the next 24-48 hours take with food and plenty of liquids as it can upset your stomach, can alternate with prescribed Tylenol #3 as instructed for fever or pain. Use nasal saline spray at least 3 times a day. (simply saline is over the counter) Followup at the health service clinic in 2-5 days or return earlier if difficulty breathing or not keeping fluids down.

## 2011-10-30 NOTE — ED Notes (Signed)
C/o she caught a cold rfom her sister; pt c/o cold and body aches since Saturday; (non-productive cough) decreased breath sounds

## 2011-10-30 NOTE — ED Provider Notes (Signed)
History     CSN: [id number]  Arrival date & time 10/30/11  1653   First MD Initiated Contact with Patient 10/30/11 1706      Chief Complaint  Patient presents with  . Cough    (Consider location/radiation/quality/duration/timing/severity/associated sxs/prior treatment) HPI Comments: 56 year old female nonsmoker with history of hypertension and acid reflux. Here complaining of nasal congestion, general body aches, dry cough and left side flank pain for 2 days. Denies nausea or vomiting. Subjective fever although patient has not taken any medication for her symptoms today and her temperature is 99.5 F here. Flank pain with coughing. Patient thinks got cough from her sister. Denies wheezing or shortness of breath. No leg swelling no PND. No dysuria or hematuria. "Did not take blood pressure meds today due to not feeling well".   Past Medical History  Diagnosis Date  . Hypertension   . GERD (gastroesophageal reflux disease)     History reviewed. No pertinent past surgical history.  History reviewed. No pertinent family history.  History  Substance Use Topics  . Smoking status: Not on file  . Smokeless tobacco: Not on file  . Alcohol Use:     OB History    Grav Para Term Preterm Abortions TAB SAB Ect Mult Living                  Review of Systems  Constitutional: Negative for chills and appetite change.  HENT: Positive for congestion and rhinorrhea. Negative for sore throat, trouble swallowing, sinus pressure and ear discharge.   Eyes: Negative for discharge.  Respiratory: Positive for cough. Negative for shortness of breath and wheezing.   Cardiovascular: Negative for palpitations and leg swelling.  Gastrointestinal: Negative for nausea, vomiting, abdominal pain and diarrhea.  Genitourinary: Positive for flank pain.  Skin: Negative for rash.    Allergies  Aspirin and Ibuprofen  Home Medications   Current Outpatient Rx  Name Route Sig Dispense Refill  .  ESCITALOPRAM OXALATE 10 MG PO TABS Oral Take 10 mg by mouth daily.    Marland Kitchen ESOMEPRAZOLE MAGNESIUM 20 MG PO CPDR Oral Take 20 mg by mouth daily before breakfast.    . POTASSIUM CHLORIDE ER 10 MEQ PO CPCR Oral Take 10 mEq by mouth 2 (two) times daily.    . TRIAMTERENE-HCTZ 37.5-25 MG PO TABS Oral Take 1 tablet by mouth daily.    . ACETAMINOPHEN-CODEINE #3 300-30 MG PO TABS Oral Take 1-2 tablets by mouth every 6 (six) hours as needed for pain. 15 tablet 0  . BENZONATATE 100 MG PO CAPS Oral Take 1 capsule (100 mg total) by mouth every 8 (eight) hours. 21 capsule 0  . DEXTROMETHORPHAN-GUAIFENESIN 10-300 MG/5ML PO LIQD Oral Take 5 mLs by mouth 4 (four) times daily as needed. 118 mL 0    BP 165/93  Pulse 88  Temp(Src) 99.5 F (37.5 C) (Oral)  Resp 16  SpO2 98%  Physical Exam  Nursing note and vitals reviewed. Constitutional: She is oriented to person, place, and time. She appears well-developed and well-nourished.  HENT:  Head: Normocephalic and atraumatic.  Right Ear: External ear normal.  Left Ear: External ear normal.       Nasal Congestion with erythema and swelling of nasal turbinates, clear rhinorrhea. Mild pharyngeal erythema no exudates. No uvula deviation. No trismus. TM's normal  Eyes: Conjunctivae and EOM are normal. Pupils are equal, round, and reactive to light. Right eye exhibits no discharge. Left eye exhibits no discharge.  Neck: Neck supple.  Cardiovascular:  Normal rate, regular rhythm and normal heart sounds.   Pulmonary/Chest: Effort normal and breath sounds normal. No respiratory distress. She has no wheezes. She has no rales. She exhibits no tenderness.  Lymphadenopathy:    She has no cervical adenopathy.  Neurological: She is alert and oriented to person, place, and time.  Skin: No rash noted.    ED Course  Procedures (including critical care time)  Labs Reviewed - No data to display Dg Chest 2 View  10/30/2011  *RADIOLOGY REPORT*  Clinical Data: Cough  CHEST - 2  VIEW  Comparison: 05/26/2010  Findings: Diffusely increased interstitial lung markings noted, without focal pulmonary opacity.  Heart size normal.  No focal pulmonary opacity.  No pleural effusion.  No acute osseous finding.  IMPRESSION: Chronically increased interstitial lung markings which is nonspecific without focal pulmonary opacity.  Original Report Authenticated By: Harrel Lemon, M.D.     1. URI (upper respiratory infection)       MDM  No consolidations on Xrays. Impress viral URI. Treated symptomatically. Restart BP medications and follow up with PCP in next 3-5 days.        Sharin Grave, MD 10/31/11 1109

## 2011-11-02 ENCOUNTER — Emergency Department (HOSPITAL_COMMUNITY): Payer: Self-pay

## 2011-11-02 ENCOUNTER — Encounter (HOSPITAL_COMMUNITY): Payer: Self-pay | Admitting: *Deleted

## 2011-11-02 ENCOUNTER — Emergency Department (HOSPITAL_COMMUNITY)
Admission: EM | Admit: 2011-11-02 | Discharge: 2011-11-02 | Disposition: A | Discharge status: Hospice | Payer: Self-pay | Attending: Emergency Medicine | Admitting: Emergency Medicine

## 2011-11-02 DIAGNOSIS — I10 Essential (primary) hypertension: Secondary | ICD-10-CM | POA: Insufficient documentation

## 2011-11-02 DIAGNOSIS — K219 Gastro-esophageal reflux disease without esophagitis: Secondary | ICD-10-CM | POA: Insufficient documentation

## 2011-11-02 DIAGNOSIS — J069 Acute upper respiratory infection, unspecified: Secondary | ICD-10-CM | POA: Insufficient documentation

## 2011-11-02 MED ORDER — HYDROCOD POLST-CHLORPHEN POLST 10-8 MG/5ML PO LQCR: 5.0000 mL | Freq: Two times a day (BID) | ORAL | Status: DC | PRN

## 2011-11-02 MED ORDER — ALBUTEROL SULFATE HFA 108 (90 BASE) MCG/ACT IN AERS
2.0000 | INHALATION_SPRAY | RESPIRATORY_TRACT | Status: DC | PRN
  Administered 2011-11-02: 2 via RESPIRATORY_TRACT
  Filled 2011-11-02: qty 6.7

## 2011-11-02 NOTE — ED Notes (Addendum)
Pt reports having being diagnosed with a 'cold' on the 21st, given pain medication that has not been working.  Pt noted to be tearful, coughing and congested.  Blisters noted on patients lips-reports that they started after she started the medication.   Pt also reports (L) side pain.  Denies urinary symptoms.

## 2011-11-02 NOTE — Discharge Instructions (Signed)
Read the instructions below on reasons to return to the emergency department and to learn more about your diagnosis.  Use over the counter medications for symptomatic relief as we discussed (musinex as a decongestant, Tylenol for fever/pain, Motrin/Ibuprofen for muscle aches). If prescribed a cough suppressant during your visit, do not operate heavy machinery with in 5 hours of taking this medication. Followup with your primary care doctor in 4 days if your symptoms persist.  Your more than welcome to return to the emergency department if symptoms worsen or become concerning. ° °Upper Respiratory Infection, Adult  °An upper respiratory infection (URI) is also sometimes known as the common cold. Most people improve within 1 week, but symptoms can last up to 2 weeks. A residual cough may last even longer.  ° °URI is most commonly caused by a virus. Viruses are NOT treated with antibiotics. You can easily spread the virus to others by oral contact. This includes kissing, sharing a [place], coughing, or sneezing. Touching your mouth or nose and then touching a surface, which is then touched by another person, can also spread the virus.  ° °TREATMENT  °Treatment is directed at relieving symptoms. There is no cure. Antibiotics are not effective, because the infection is caused by a virus, not by bacteria. Treatment may include:  °Increased fluid intake. Sports drinks offer valuable electrolytes, sugars, and fluids.  °Breathing heated mist or steam (vaporizer or shower).  °Eating chicken soup or other clear broths, and maintaining good nutrition.  °Getting plenty of rest.  °Using gargles or lozenges for comfort.  °Controlling fevers with ibuprofen or acetaminophen as directed by your caregiver.  °Increasing usage of your inhaler if you have asthma.  °Return to work when your temperature has returned to normal.  ° °SEEK MEDICAL CARE IF:  °After the first few days, you feel you are getting worse rather than better.  °You  develop worsening shortness of breath, or brown or red sputum. These may be signs of pneumonia.  °You develop yellow or brown nasal discharge or pain in the face, especially when you bend forward. These may be signs of sinusitis.  °You develop a fever, swollen neck glands, pain with swallowing, or white areas in the back of your throat. These may be signs of strep throat.  ° °

## 2011-11-02 NOTE — ED Provider Notes (Signed)
History     CSN: [id number]  Arrival date & time 11/02/11  7829   First MD Initiated Contact with Patient 11/02/11 1039      Chief Complaint  Patient presents with  . URI    (Consider location/radiation/quality/duration/timing/severity/associated sxs/prior treatment) HPI Comments: Patient comes in today with a chief complaint of productive cough and nasal congestion for the past 6 days.  She was seen by Urgent Care 3 days ago and was diagnosed with a URI and prescribed tessalon perles.  She reports that her cough has not improved.  She denies any chest pain or SOB.  Denies fever or chills.  However, she reports that she has had intermittent wheezing.  She does not smoke and has been smoked.   Patient is a 56 y.o. female presenting with URI. The history is provided by the patient.  URI The primary symptoms include cough and wheezing. Primary symptoms do not include fever, headaches, ear pain, sore throat, abdominal pain, nausea, vomiting or rash.  The illness is not associated with chills.    Past Medical History  Diagnosis Date  . Hypertension   . GERD (gastroesophageal reflux disease)     History reviewed. No pertinent past surgical history.  History reviewed. No pertinent family history.  History  Substance Use Topics  . Smoking status: Never Smoker   . Smokeless tobacco: Not on file  . Alcohol Use: No    OB History    Grav Para Term Preterm Abortions TAB SAB Ect Mult Living                  Review of Systems  Constitutional: Negative for fever, chills and diaphoresis.  HENT: Negative for ear pain and sore throat.   Respiratory: Positive for cough and wheezing. Negative for chest tightness.   Cardiovascular: Negative for chest pain and leg swelling.  Gastrointestinal: Negative for nausea, vomiting and abdominal pain.  Skin: Negative for rash.  Neurological: Negative for dizziness, syncope and headaches.    Allergies  Aspirin and Ibuprofen  Home Medications    Current Outpatient Rx  Name Route Sig Dispense Refill  . ACETAMINOPHEN-CODEINE #3 300-30 MG PO TABS Oral Take 1-2 tablets by mouth every 6 (six) hours as needed. As needed for pain.    Marland Kitchen BENZONATATE 100 MG PO CAPS Oral Take 1 capsule (100 mg total) by mouth every 8 (eight) hours. 21 capsule 0  . ESCITALOPRAM OXALATE 10 MG PO TABS Oral Take 10 mg by mouth daily.    Marland Kitchen ESOMEPRAZOLE MAGNESIUM 20 MG PO CPDR Oral Take 20 mg by mouth daily before breakfast.    . POTASSIUM CHLORIDE ER 10 MEQ PO CPCR Oral Take 10 mEq by mouth 2 (two) times daily.    . TRIAMTERENE-HCTZ 37.5-25 MG PO TABS Oral Take 1 tablet by mouth daily.      BP 123/81  Pulse 88  Temp(Src) 98.2 F (36.8 C) (Oral)  Resp 18  SpO2 97%  Physical Exam  Nursing note and vitals reviewed. Constitutional: She appears well-developed and well-nourished. No distress.  HENT:  Head: Normocephalic and atraumatic.  Right Ear: Hearing, tympanic membrane, external ear and ear canal normal.  Left Ear: Hearing, tympanic membrane, external ear and ear canal normal.  Nose: Nose normal. Right sinus exhibits no maxillary sinus tenderness and no frontal sinus tenderness. Left sinus exhibits no maxillary sinus tenderness and no frontal sinus tenderness.  Mouth/Throat: Uvula is midline, oropharynx is clear and moist and mucous membranes are normal.  Neck:  Normal range of motion. Neck supple.  Cardiovascular: Normal rate, regular rhythm and normal heart sounds.   Pulmonary/Chest: Effort normal. No accessory muscle usage. Not tachypneic. No respiratory distress. She has decreased breath sounds. She has wheezes. She has no rhonchi. She has no rales.       Mild expiratory wheezing of the bases of both lungs  Neurological: She is alert.  Skin: Skin is warm and dry. No rash noted. She is not diaphoretic. No erythema.  Psychiatric: She has a normal mood and affect.    ED Course  Procedures (including critical care time)   Labs Reviewed  URINALYSIS,  ROUTINE W REFLEX MICROSCOPIC   Dg Chest 2 View  11/02/2011  *RADIOLOGY REPORT*  Clinical Data: Upper respiratory tract infection  CHEST - 2 VIEW  Comparison: 10/30/2011  Findings: The heart size and mediastinal contours are within normal limits.  Both lungs are clear.  The visualized skeletal structures are unremarkable.  IMPRESSION: Negative exam.  Original Report Authenticated By: Rosealee Albee, M.D.     No diagnosis found.    MDM  Patient with productive cough and nasal congestion x 6 days.  Negative CXR.  No respiratory distress.  Only mild wheezing on exam.  No CP or SOB.  Therefore, feel that symptoms are most likely URI.  Patient given Albuterol inhaler and Tussionex for symptoms.  Return precautions discussed with patient.  Pt instructed to follow up with PCP.        Pascal Lux Notus, PA-C 11/02/11 1545

## 2011-11-04 NOTE — ED Provider Notes (Signed)
Evaluation and management procedures were performed by the PA/NP/resident physician under my supervision/collaboration.   Author [place] D Wayde Gopaul, MD 11/04/11 2053 

## 2012-02-29 ENCOUNTER — Emergency Department (HOSPITAL_COMMUNITY)
Admission: EM | Admit: 2012-02-29 | Discharge: 2012-02-29 | Disposition: A | Discharge status: Home / Self Care | Payer: Self-pay | Attending: Emergency Medicine | Admitting: Emergency Medicine

## 2012-02-29 ENCOUNTER — Encounter (HOSPITAL_COMMUNITY): Payer: Self-pay | Admitting: Emergency Medicine

## 2012-02-29 DIAGNOSIS — Z888 Allergy status to other drugs, medicaments and biological substances status: Secondary | ICD-10-CM | POA: Insufficient documentation

## 2012-02-29 DIAGNOSIS — K219 Gastro-esophageal reflux disease without esophagitis: Secondary | ICD-10-CM | POA: Insufficient documentation

## 2012-02-29 DIAGNOSIS — J069 Acute upper respiratory infection, unspecified: Secondary | ICD-10-CM | POA: Insufficient documentation

## 2012-02-29 DIAGNOSIS — I10 Essential (primary) hypertension: Secondary | ICD-10-CM | POA: Insufficient documentation

## 2012-02-29 MED ORDER — PREDNISONE 50 MG PO TABS: 50.0000 mg | ORAL_TABLET | Freq: Every day | ORAL | Status: AC

## 2012-02-29 MED ORDER — GUAIFENESIN ER 1200 MG PO TB12: 1.0000 | ORAL_TABLET | Freq: Two times a day (BID) | ORAL | Status: DC

## 2012-02-29 NOTE — ED Provider Notes (Signed)
History     CSN: [id number]  Arrival date & time 02/29/12  1308   First MD Initiated Contact with Patient 02/29/12 [phone]      Chief Complaint  Patient presents with  . Ear Fullness    (Consider location/radiation/quality/duration/timing/severity/associated sxs/prior treatment) HPI Patient presents with nasal congestion ear fullness and cough for the last 2 days. The patient states that her ears feel like there is fluid in them. The patient denies CP, SOB, weakness, nausea, vomiting, diarrhea, fatigue, or fevers. The patient has not tried anything for her symptoms. The patient states nothing has seemed to make her condition worse. Past Medical History  Diagnosis Date  . Hypertension   . GERD (gastroesophageal reflux disease)     Past Surgical History  Procedure Date  . Tubal ligation   . Toe surgery     History reviewed. No pertinent family history.  History  Substance Use Topics  . Smoking status: Never Smoker   . Smokeless tobacco: Not on file  . Alcohol Use: No    OB History    Grav Para Term Preterm Abortions TAB SAB Ect Mult Living                  Review of Systems All other systems negative except as documented in the HPI. All pertinent positives and negatives as reviewed in the HPI.  Allergies  Aspirin and Ibuprofen  Home Medications   Current Outpatient Rx  Name Route Sig Dispense Refill  . ESCITALOPRAM OXALATE 10 MG PO TABS Oral Take 10 mg by mouth daily.    Marland Kitchen ESOMEPRAZOLE MAGNESIUM 20 MG PO CPDR Oral Take 20 mg by mouth daily before breakfast.    . TRIAMTERENE-HCTZ 37.5-25 MG PO TABS Oral Take 1 tablet by mouth daily.      BP 157/87  Pulse 73  Temp 97.6 F (36.4 C) (Oral)  Resp 18  SpO2 97%  Physical Exam  Nursing note and vitals reviewed. Constitutional: She is oriented to person, place, and time. She appears well-developed and well-nourished.  HENT:  Head: Normocephalic and atraumatic.  Right Ear: No mastoid tenderness. Tympanic  membrane is not injected, not erythematous, not retracted and not bulging. A middle ear effusion is present. No decreased hearing is noted.  Left Ear: No mastoid tenderness. Tympanic membrane is not injected, not erythematous, not retracted and not bulging. A middle ear effusion is present. No decreased hearing is noted.  Mouth/Throat: Uvula is midline, oropharynx is clear and moist and mucous membranes are normal. No uvula swelling. No oropharyngeal exudate.  Eyes: Conjunctivae are normal. Pupils are equal, round, and reactive to light.  Neck: Normal range of motion. Neck supple.  Cardiovascular: Normal rate and regular rhythm.  Exam reveals no gallop and no friction rub.   No murmur heard. Pulmonary/Chest: Effort normal and breath sounds normal. She has no wheezes. She has no rales.  Neurological: She is alert and oriented to person, place, and time.  Skin: Skin is warm and dry.    ED Course  Procedures (including critical care time) The patient will be treated for URI based on her HPI and PE. The patient is advised to use Afrin for 2 days only. Told to return here as needed. Increase her fluid intake.     MDM          Carlyle Dolly, PA-C 02/29/12 1134

## 2012-02-29 NOTE — ED Notes (Signed)
Pt c/o bilateral ear fullness and diminished hearing for the last 2 days. Denies fevers.

## 2012-03-05 NOTE — ED Provider Notes (Signed)
Medical screening examination/treatment/procedure(s) were performed by non-physician practitioner and as supervising physician I was immediately available for consultation/collaboration.  Silver Achey R. Mickie Kozikowski, MD 03/05/12 0012 

## 2012-03-21 ENCOUNTER — Emergency Department (HOSPITAL_COMMUNITY)
Admission: EM | Admit: 2012-03-21 | Discharge: 2012-03-21 | Disposition: A | Discharge status: Home / Self Care | Payer: Self-pay | Source: Home / Self Care

## 2012-03-21 ENCOUNTER — Emergency Department (HOSPITAL_COMMUNITY): Payer: Self-pay

## 2012-03-21 ENCOUNTER — Encounter (HOSPITAL_COMMUNITY): Payer: Self-pay | Admitting: *Deleted

## 2012-03-21 ENCOUNTER — Emergency Department (HOSPITAL_COMMUNITY)
Admission: EM | Admit: 2012-03-21 | Discharge: 2012-03-21 | Disposition: A | Discharge status: Home / Self Care | Payer: Self-pay | Attending: Emergency Medicine | Admitting: Emergency Medicine

## 2012-03-21 DIAGNOSIS — M25569 Pain in unspecified knee: Secondary | ICD-10-CM | POA: Insufficient documentation

## 2012-03-21 DIAGNOSIS — K219 Gastro-esophageal reflux disease without esophagitis: Secondary | ICD-10-CM | POA: Insufficient documentation

## 2012-03-21 DIAGNOSIS — G8929 Other chronic pain: Secondary | ICD-10-CM | POA: Insufficient documentation

## 2012-03-21 DIAGNOSIS — I10 Essential (primary) hypertension: Secondary | ICD-10-CM | POA: Insufficient documentation

## 2012-03-21 DIAGNOSIS — M25561 Pain in right knee: Secondary | ICD-10-CM

## 2012-03-21 MED ORDER — HYDROCODONE-ACETAMINOPHEN 5-325 MG PO TABS: 2.0000 | ORAL_TABLET | ORAL | Status: DC | PRN

## 2012-03-21 MED ORDER — HYDROCODONE-ACETAMINOPHEN 5-325 MG PO TABS
2.0000 | ORAL_TABLET | ORAL | Status: AC
  Administered 2012-03-21: 2 via ORAL
  Filled 2012-03-21: qty 2

## 2012-03-21 MED ORDER — HYDROCODONE-ACETAMINOPHEN 5-325 MG PO TABS: 2.0000 | ORAL_TABLET | ORAL | Status: AC | PRN

## 2012-03-21 NOTE — ED Provider Notes (Signed)
History  This chart was scribed for Jones Skene, MD by Albertha Ghee Rifaie. This patient was seen in room TR07C/TR07C and the patient's care was started at 10:08AM.  CSN: [id number]  Arrival date & time 03/21/12  1008   None     Chief Complaint  Patient presents with  . Knee Pain     The history is provided by the patient. No language interpreter was used.    [name] is a 56 y.o. female who presents to the Emergency Department complaining of 3 weeks of gradual onset, gradual worsening, constant right knee pain described as shooting that radiates into her right lower leg that started at work. She rates her pain a 9 out of 10 in severity and reports that she has been taking 800 mg Motrin, OTC arthritis reliever, ice packs and elevating the knee with no improvement. The symptoms are generally worse at night. Pt states that she has the same symptoms 2 years ago that was diagnosed as arthritis through XR. She reports that she had cortisone injections and PT with improvement in her symptoms. Pt denies having prior or recent injuries. She denies being on birth control. Pt denies coughing, palpitations, wheezing, neck pain, change in the vision, nausea, diarrhea, hematochezia, urinary problems, vaginal discharge. She has a h/o HTN and GERD. Pt denies smoking and alcohol use.   The pt is right handed.  Past Medical History  Diagnosis Date  . Hypertension   . GERD (gastroesophageal reflux disease)     Past Surgical History  Procedure Date  . Tubal ligation   . Toe surgery     No family history on file.  History  Substance Use Topics  . Smoking status: Never Smoker   . Smokeless tobacco: Not on file  . Alcohol Use: No    No OB history provided   Review of Systems   REVIEW OF SYSTEMS:   1.) CONSTITUTIONAL: No fever, chills or systemic signs of infection. No recent, unexplained weight changes.   2.) HEENT: No facial pain, sinus congestion or rhinorrhea is reported.  Patient is denying any acute visual or hearing deficits. No sore throat or difficulty swallowing.  3.) NECK: No swelling or masses are reported.   4.) PULMONARY: No cough sputum production or shortness of breath was reported.   5.) CARDIAC: No palpitations, chest pain or pressure.   6.) ABDOMINAL: Denies abdominal pain, nausea, vomiting or diarrhea. No Hematochezia or melena.  7.) GENITOURINARY: No burning with urination or frequency. No discharge.  8.) BACK: Denying any flank or CVA tenderness. No specific thoracic or lumbar pain.   9.) EXTREMITIES: Positive for right knee pain. Denying any extremity edema pitting or rash.   10.) SKIN: No rashes, itching  Allergies  Aspirin and Ibuprofen  Home Medications   Current Outpatient Rx  Name Route Sig Dispense Refill  . ESCITALOPRAM OXALATE 10 MG PO TABS Oral Take 10 mg by mouth daily.    Marland Kitchen ESOMEPRAZOLE MAGNESIUM 20 MG PO CPDR Oral Take 20 mg by mouth daily before breakfast.    . IBUPROFEN 800 MG PO TABS Oral Take 800 mg by mouth every 8 (eight) hours as needed. For pain    . TRIAMTERENE-HCTZ 37.5-25 MG PO TABS Oral Take 1 tablet by mouth daily.      Triage Vitals: BP 162/84  Pulse 72  Temp 97.7 F (36.5 C) (Oral)  Resp 16  Ht 4\' 11"  (1.499 m)  Wt 160 lb (72.576 kg)  BMI 32.32 kg/m2  SpO2 97%  Physical Exam  Nursing notes reviewed.  Electronic medical record reviewed. VITAL SIGNS:   Filed Vitals:   03/21/12 1011 03/21/12 1015  BP: 162/84   Pulse: 72   Temp: 97.7 F (36.5 C)   TempSrc: Oral   Resp: 16   Height:  4\' 11"  (1.499 m)  Weight:  160 lb (72.576 kg)  SpO2: 97%    CONSTITUTIONAL: Awake, oriented, appears non-toxic HENT: Atraumatic, normocephalic, oral mucosa pink and moist, airway patent. Nares patent without drainage. External ears normal. EYES: Conjunctiva clear, EOMI, PERRLA NECK: Trachea midline, non-tender, supple CARDIOVASCULAR: Normal heart rate, Normal rhythm, No murmurs, rubs,  gallops PULMONARY/CHEST: Clear to auscultation, no rhonchi, wheezes, or rales. Symmetrical breath sounds. Non-tender. ABDOMINAL: Non-distended, soft, non-tender - no rebound or guarding.  BS normal. NEUROLOGIC: Non-focal, moving all four extremities, no gross sensory or motor deficits. EXTREMITIES: Right leg is longer than left leg by 1-2 cm, tenderness in inferior to the medial joint line, no joint instability, no effusion. No clubbing, cyanosis, or edema.  SKIN: Warm, Dry, No erythema, No rash  ED Course  Procedures (including critical care time)  DIAGNOSTIC STUDIES: Oxygen Saturation is 97% on room air, adeqaute by my interpretation.    COORDINATION OF CARE: 11:20 AM-Discussed discharge plan which includes PT and knee imbolizer with pt at bedside and pt agreed to plan.  11:50AM-Informed pt of negative radiology report and repeated discharge plans.   Labs Reviewed - No data to display Dg Knee Complete 4 Views Right  03/21/2012  *RADIOLOGY REPORT*  Clinical Data: Medial right knee pain  RIGHT KNEE - COMPLETE 4+ VIEW  Comparison: None  Findings: No joint space narrowing of the medial or lateral compartment.  Patellofemoral compartments normal.  No joint effusion.  IMPRESSION: No fracture or arthropathy of the right knee.   Original Report Authenticated By: Genevive Bi, M.D.      No diagnosis found.    MDM  [name] is a 56 y.o. female presenting with acute on chronic right knee pain.  Pt mentioned h/o arthritis, checked XR showing no fracture or joint narrowing.  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  Possible leg length discrepancy.  Referred to sports medicine for PT - pt had this injury previously and completely recovered with PT.  Conservative measures d/w patient i.e. RICE and knee brace with NSAIDS.  I explained the diagnosis and have given explicit precautions to return to the ER including joint swelling/redness severe pain or any other new or worsening symptoms.  The patient understands and accepts the medical plan as it's been dictated and I have answered their questions. Discharge instructions concerning home care and prescriptions have been given.  The patient is STABLE and is discharged to home in good condition.         Jones Skene, MD 03/23/12 1529

## 2012-03-21 NOTE — Progress Notes (Signed)
Orthopedic Tech Progress Note Patient Details:  [name] 1956-02-19 [id number]  Ortho Devices Type of Ortho Device: Knee Sleeve Ortho Device/Splint Interventions: Application   Shawnie Pons 03/21/2012, 12:32 PM

## 2012-03-21 NOTE — ED Notes (Signed)
Pt states she has pain in her R knee.  "They told me it was arthritis." Stated sometimes the R knee becomes swollen. R knee not swollen compared to L knee at this time.

## 2012-03-21 NOTE — ED Notes (Signed)
Patient reports she has pain in her right knee x 3 weeks.  She denies trauma. No obvious deformity noted

## 2012-04-09 ENCOUNTER — Emergency Department (HOSPITAL_COMMUNITY): Payer: Self-pay

## 2012-04-09 ENCOUNTER — Emergency Department (HOSPITAL_COMMUNITY)
Admission: EM | Admit: 2012-04-09 | Discharge: 2012-04-09 | Disposition: A | Discharge status: Home / Self Care | Payer: Self-pay | Attending: Emergency Medicine | Admitting: Emergency Medicine

## 2012-04-09 ENCOUNTER — Encounter (HOSPITAL_COMMUNITY): Payer: Self-pay | Admitting: *Deleted

## 2012-04-09 DIAGNOSIS — I10 Essential (primary) hypertension: Secondary | ICD-10-CM | POA: Insufficient documentation

## 2012-04-09 DIAGNOSIS — K219 Gastro-esophageal reflux disease without esophagitis: Secondary | ICD-10-CM | POA: Insufficient documentation

## 2012-04-09 DIAGNOSIS — M1710 Unilateral primary osteoarthritis, unspecified knee: Secondary | ICD-10-CM | POA: Insufficient documentation

## 2012-04-09 DIAGNOSIS — M1711 Unilateral primary osteoarthritis, right knee: Secondary | ICD-10-CM

## 2012-04-09 MED ORDER — KETOROLAC TROMETHAMINE 60 MG/2ML IM SOLN
60.0000 mg | Freq: Once | INTRAMUSCULAR | Status: AC
  Administered 2012-04-09: 60 mg via INTRAMUSCULAR
  Filled 2012-04-09: qty 2

## 2012-04-09 MED ORDER — TRAMADOL HCL 50 MG PO TABS: 50.0000 mg | ORAL_TABLET | Freq: Four times a day (QID) | ORAL | Status: DC | PRN

## 2012-04-09 NOTE — ED Notes (Signed)
Patient requesting social work visit for healthcare services, former [facility] patient

## 2012-04-09 NOTE — ED Provider Notes (Signed)
History  Scribed for No att. providers found, the patient was seen in room OTFC/OTF. This chart was scribed by Candelaria Stagers. The patient's care started at 12:29 PM   CSN: [id number]  Arrival date & time 04/09/12  1005   None     Chief Complaint  Patient presents with  . Knee Pain     The history is provided by the patient. No language interpreter was used.   [name] is a 56 y.o. female who presents to the Emergency Department complaining of continued right knee pain that started about one month ago and has gotten worse.  Pt was seen in the ED about two weeks ago and states the pain has gotten worse since.  She has taken pain medication with little relief.  Pt reports she has iced the knee and elevated the leg with little relief.  She was a former pt of [facility] and currently does not have a PCP.      Past Medical History  Diagnosis Date  . Hypertension   . GERD (gastroesophageal reflux disease)     Past Surgical History  Procedure Date  . Tubal ligation   . Toe surgery     No family history on file.  History  Substance Use Topics  . Smoking status: Never Smoker   . Smokeless tobacco: Not on file  . Alcohol Use: No    OB History    Grav Para Term Preterm Abortions TAB SAB Ect Mult Living                  Review of Systems  Musculoskeletal: Positive for arthralgias (right knee pain).  All other systems reviewed and are negative.    Allergies  Aspirin and Ibuprofen  Home Medications   Current Outpatient Rx  Name Route Sig Dispense Refill  . ESCITALOPRAM OXALATE 10 MG PO TABS Oral Take 10 mg by mouth daily.    Marland Kitchen ESOMEPRAZOLE MAGNESIUM 20 MG PO CPDR Oral Take 20 mg by mouth daily before breakfast.    . IBUPROFEN 800 MG PO TABS Oral Take 800 mg by mouth every 8 (eight) hours as needed. For pain    . TRAMADOL HCL 50 MG PO TABS Oral Take 1 tablet (50 mg total) by mouth every 6 (six) hours as needed for pain. 15 tablet 0  . TRIAMTERENE-HCTZ 37.5-25 MG  PO TABS Oral Take 1 tablet by mouth daily.      BP 157/91  Pulse 75  Temp 97.5 F (36.4 C) (Oral)  Resp 18  SpO2 97%  Physical Exam  Nursing note and vitals reviewed. Constitutional: She is oriented to person, place, and time. She appears well-developed and well-nourished. No distress.  HENT:  Head: Normocephalic and atraumatic.  Eyes: EOM are normal. Pupils are equal, round, and reactive to light.  Neck: Neck supple. No tracheal deviation present.  Pulmonary/Chest: Effort normal. No respiratory distress.  Musculoskeletal: Normal range of motion. She exhibits no edema.       No joint instability or laxity to the right knee.  Pulses intact.  Neurovascularly intact distally.   Neurological: She is alert and oriented to person, place, and time.  Skin: Skin is warm and dry.  Psychiatric: She has a normal mood and affect. Her behavior is normal.    ED Course  Procedures   DIAGNOSTIC STUDIES: Oxygen Saturation is 97% on room air, normal by my interpretation.    COORDINATION OF CARE:  10:18 Ordered: DG Knee Complete 4 Views Right  Labs Reviewed - No data to display Dg Knee Complete 4 Views Right  04/09/2012  *RADIOLOGY REPORT*  Clinical Data: Knee pain  RIGHT KNEE - COMPLETE 4+ VIEW  Comparison: 03/21/2012  Findings: Four views of the right knee submitted.  No acute fracture or subluxation.  Minimal spurring of the patella.  No joint effusion.  IMPRESSION: No acute fracture or subluxation.  No joint effusion.   Original Report Authenticated By: Natasha Mead, M.D.      1. Osteoarthritis of right knee       MDM  I personally performed the services described in this documentation, which was scribed in my presence. The recorded information has been reviewed and considered.       Loren Racer, MD 04/09/12 1229

## 2012-04-09 NOTE — ED Notes (Signed)
Pt is here with right knee pain that continues to hurt.  Pt was told she has arthritis and may have feet problem.  Pulse present.  Pt reports some swelling.  NO md since [facility] closed

## 2012-10-24 ENCOUNTER — Emergency Department (HOSPITAL_COMMUNITY)
Admission: EM | Admit: 2012-10-24 | Discharge: 2012-10-24 | Disposition: A | Discharge status: Home / Self Care | Payer: Self-pay | Attending: Emergency Medicine | Admitting: Emergency Medicine

## 2012-10-24 ENCOUNTER — Encounter (HOSPITAL_COMMUNITY): Payer: Self-pay | Admitting: Emergency Medicine

## 2012-10-24 ENCOUNTER — Emergency Department (HOSPITAL_COMMUNITY): Payer: Self-pay

## 2012-10-24 DIAGNOSIS — K219 Gastro-esophageal reflux disease without esophagitis: Secondary | ICD-10-CM | POA: Insufficient documentation

## 2012-10-24 DIAGNOSIS — R61 Generalized hyperhidrosis: Secondary | ICD-10-CM | POA: Insufficient documentation

## 2012-10-24 DIAGNOSIS — R079 Chest pain, unspecified: Secondary | ICD-10-CM | POA: Insufficient documentation

## 2012-10-24 DIAGNOSIS — F411 Generalized anxiety disorder: Secondary | ICD-10-CM | POA: Insufficient documentation

## 2012-10-24 DIAGNOSIS — Z79899 Other long term (current) drug therapy: Secondary | ICD-10-CM | POA: Insufficient documentation

## 2012-10-24 DIAGNOSIS — R0602 Shortness of breath: Secondary | ICD-10-CM | POA: Insufficient documentation

## 2012-10-24 LAB — BASIC METABOLIC PANEL
BUN: 11 mg/dL (ref 6–23)
Calcium: 8.9 mg/dL (ref 8.4–10.5)
Chloride: 106 mEq/L (ref 96–112)
Creatinine, Ser: 0.61 mg/dL (ref 0.50–1.10)
GFR calc Af Amer: >90 mL/min (ref >90)

## 2012-10-24 LAB — POCT I-STAT TROPONIN I

## 2012-10-24 LAB — CBC
HCT: 38.2 % (ref 36.0–46.0)
MCH: 29.8 pg (ref 26.0–34.0)
MCV: 89.7 fL (ref 78.0–100.0)
RDW: 12.1 % (ref 11.5–15.5)
WBC: 8.1 10*3/uL (ref 4.0–10.5)

## 2012-10-24 MED ORDER — TRIAMTERENE-HCTZ 37.5-25 MG PO TABS: 1.0000 | ORAL_TABLET | Freq: Every day | ORAL | Status: DC

## 2012-10-24 MED ORDER — OMEPRAZOLE 20 MG PO CPDR: 20.0000 mg | DELAYED_RELEASE_CAPSULE | Freq: Every day | ORAL | Status: DC

## 2012-10-24 MED ORDER — LORAZEPAM 1 MG PO TABS: 1.0000 mg | ORAL_TABLET | Freq: Three times a day (TID) | ORAL | Status: DC | PRN

## 2012-10-24 NOTE — ED Notes (Addendum)
Pt presenting to ed with c/o not feeling well this morning. Pt states she has been out of her blood pressure medications x 8 months due to not having a job. Pt states she is under a lot of stress her daughter just passed away on 11-09-2012 and she hasn't been employed. Pt states she also has a cough. Pt is very tearful at this time. Pt states she took her blood pressure this morning at work and it was elevated. Pt states chest pain this morning while driving but none at this time

## 2012-10-24 NOTE — ED Provider Notes (Signed)
Medical screening examination/treatment/procedure(s) were performed by non-physician practitioner and as supervising physician I was immediately available for consultation/collaboration.   Adelayde Minney, MD 10/24/12 1451 

## 2012-10-24 NOTE — ED Provider Notes (Signed)
History     CSN: [id number]  Arrival date & time 10/24/12  4098   First MD Initiated Contact with Patient 10/24/12 [phone]      Chief Complaint  Patient presents with  . Hypertension  . Chest Pain    (Consider location/radiation/quality/duration/timing/severity/associated sxs/prior treatment) HPI Comments: Patient presents to the ED with a chief complaint of hypertension.  Patient states that she has been off of her medications for the past several months.  She normally takes maxzide and nexium.  Patient states that she recently lost her daughter and has felt anxious.  She states that today when she got to work she had her blood pressure checked and it was high.  She tells me that she wanted to come be evaluated to see if she could return to work.  Currently, she denies any chest pain, shortness of breath, or diaphoresis. She asks if I can refill her medications and give her a referral to a PCP.  The history is provided by the patient. No language interpreter was used.    Past Medical History  Diagnosis Date  . Hypertension   . GERD (gastroesophageal reflux disease)     Past Surgical History  Procedure Laterality Date  . Tubal ligation    . Toe surgery      No family history on file.  History  Substance Use Topics  . Smoking status: Never Smoker   . Smokeless tobacco: Not on file  . Alcohol Use: No    OB History   Grav Para Term Preterm Abortions TAB SAB Ect Mult Living                  Review of Systems  All other systems reviewed and are negative.    Allergies  Aspirin and Ibuprofen  Home Medications   Current Outpatient Rx  Name  Route  Sig  Dispense  Refill  . escitalopram (LEXAPRO) 10 MG tablet   Oral   Take 10 mg by mouth daily.         Marland Kitchen esomeprazole (NEXIUM) 20 MG capsule   Oral   Take 20 mg by mouth daily before breakfast.         . ibuprofen (ADVIL,MOTRIN) 800 MG tablet   Oral   Take 800 mg by mouth every 8 (eight) hours as needed. For  pain         . traMADol (ULTRAM) 50 MG tablet   Oral   Take 1 tablet (50 mg total) by mouth every 6 (six) hours as needed for pain.   15 tablet   0   . triamterene-hydrochlorothiazide (MAXZIDE-25) 37.5-25 MG per tablet   Oral   Take 1 tablet by mouth daily.           BP 168/92  Pulse 63  Temp(Src) 97.7 F (36.5 C) (Oral)  Resp 22  SpO2 99%  Physical Exam  Nursing note and vitals reviewed. Constitutional: She is oriented to person, place, and time. She appears well-developed and well-nourished.  HENT:  Head: Normocephalic and atraumatic.  Eyes: Conjunctivae and EOM are normal. Pupils are equal, round, and reactive to light.  Neck: Normal range of motion. Neck supple.  Cardiovascular: Normal rate and regular rhythm.  Exam reveals no gallop and no friction rub.   No murmur heard. Pulmonary/Chest: Effort normal and breath sounds normal. No respiratory distress. She has no wheezes. She has no rales. She exhibits no tenderness.  Abdominal: Soft. Bowel sounds are normal. She exhibits no distension  and no mass. There is no tenderness. There is no rebound and no guarding.  Musculoskeletal: Normal range of motion. She exhibits no edema and no tenderness.  Neurological: She is alert and oriented to person, place, and time.  Skin: Skin is warm and dry.  Psychiatric: She has a normal mood and affect. Her behavior is normal. Judgment and thought content normal.    ED Course  Procedures (including critical care time)  Labs Reviewed  CBC  BASIC METABOLIC PANEL   Results for orders placed during the hospital encounter of 10/24/12  CBC      Result Value Range   WBC 8.1  4.0 - 10.5 K/uL   RBC 4.26  3.87 - 5.11 MIL/uL   Hemoglobin 12.7  12.0 - 15.0 g/dL   HCT 78.2  95.6 - 21.3 %   MCV 89.7  78.0 - 100.0 fL   MCH 29.8  26.0 - 34.0 pg   MCHC 33.2  30.0 - 36.0 g/dL   RDW 08.6  57.8 - 46.9 %   Platelets 326  150 - 400 K/uL  BASIC METABOLIC PANEL      Result Value Range   Sodium  141  135 - 145 mEq/L   Potassium 3.5  3.5 - 5.1 mEq/L   Chloride 106  96 - 112 mEq/L   CO2 25  19 - 32 mEq/L   Glucose, Bld 105 (*) 70 - 99 mg/dL   BUN 11  6 - 23 mg/dL   Creatinine, Ser 6.29  0.50 - 1.10 mg/dL   Calcium 8.9  8.4 - 52.8 mg/dL   GFR calc non Af Amer >90  >90 mL/min   GFR calc Af Amer >90  >90 mL/min  POCT I-STAT TROPONIN I      Result Value Range   Troponin i, poc 0.00  0.00 - 0.08 ng/mL   Comment 3           POCT I-STAT TROPONIN I      Result Value Range   Troponin i, poc 0.00  0.00 - 0.08 ng/mL   Comment 3            Dg Chest 2 View  10/24/2012  *RADIOLOGY REPORT*  Clinical Data: Cough with left-sided chest pain.  CHEST - 2 VIEW  Comparison: 10/30/2011 and 11/02/2011.  Findings: The heart size and mediastinal contours are stable.  The lungs are clear.  There is no pleural effusion or pneumothorax. Telemetry leads overlie the chest.  IMPRESSION: Stable examination.  No acute cardiopulmonary process.   Original Report Authenticated By: Carey Bullocks, M.D.      ED ECG REPORT  I personally interpreted this EKG   Date: 10/24/2012   Rate: 65  Rhythm: normal sinus rhythm  QRS Axis: normal  Intervals: normal  ST/T Wave abnormalities: normal  Conduction Disutrbances:none  Narrative Interpretation:   Old EKG Reviewed: unchanged    1. Chest pain       MDM  Patient with HTN and intermittent chest pain.  No pain while in the ED.  No SOB or diaphoresis.  Low suspicion for ACS, however will check another troponin at 11:00.    11:52 AM 2nd troponin is negative at >3hours from first.  Will discharge the patient with PCP follow-up.  Also recommend cardiology evaluation on outpatient basis. Discussed the patient and the plan with Dr. Anitra Lauth, who agrees with the plan.       Roxy Horseman, PA-C 10/24/12 1153

## 2012-11-23 ENCOUNTER — Emergency Department (HOSPITAL_COMMUNITY): Payer: Self-pay

## 2012-11-23 ENCOUNTER — Encounter (HOSPITAL_COMMUNITY): Payer: Self-pay | Admitting: Adult Health

## 2012-11-23 DIAGNOSIS — J069 Acute upper respiratory infection, unspecified: Secondary | ICD-10-CM | POA: Insufficient documentation

## 2012-11-23 DIAGNOSIS — R059 Cough, unspecified: Secondary | ICD-10-CM | POA: Insufficient documentation

## 2012-11-23 DIAGNOSIS — J3489 Other specified disorders of nose and nasal sinuses: Secondary | ICD-10-CM | POA: Insufficient documentation

## 2012-11-23 DIAGNOSIS — J029 Acute pharyngitis, unspecified: Secondary | ICD-10-CM | POA: Insufficient documentation

## 2012-11-23 DIAGNOSIS — R05 Cough: Secondary | ICD-10-CM | POA: Insufficient documentation

## 2012-11-23 DIAGNOSIS — I10 Essential (primary) hypertension: Secondary | ICD-10-CM | POA: Insufficient documentation

## 2012-11-23 DIAGNOSIS — K219 Gastro-esophageal reflux disease without esophagitis: Secondary | ICD-10-CM | POA: Insufficient documentation

## 2012-11-23 LAB — BASIC METABOLIC PANEL
BUN: 13 mg/dL (ref 6–23)
Calcium: 9.8 mg/dL (ref 8.4–10.5)
Creatinine, Ser: 0.86 mg/dL (ref 0.50–1.10)
GFR calc non Af Amer: 74 mL/min — ABNORMAL LOW (ref >90)
Glucose, Bld: 137 mg/dL — ABNORMAL HIGH (ref 70–99)
Sodium: 134 mEq/L — ABNORMAL LOW (ref 135–145)

## 2012-11-23 LAB — CBC
HCT: 38.5 % (ref 36.0–46.0)
Hemoglobin: 13.7 g/dL (ref 12.0–15.0)
MCH: 30.9 pg (ref 26.0–34.0)
MCHC: 35.6 g/dL (ref 30.0–36.0)
MCV: 86.9 fL (ref 78.0–100.0)

## 2012-11-23 MED ORDER — ACETAMINOPHEN 325 MG PO TABS
650.0000 mg | ORAL_TABLET | Freq: Four times a day (QID) | ORAL | Status: DC | PRN
  Administered 2012-11-23 – 2012-11-24 (×2): 650 mg via ORAL
  Filled 2012-11-23: qty 2

## 2012-11-23 MED ORDER — ONDANSETRON 4 MG PO TBDP
8.0000 mg | ORAL_TABLET | Freq: Once | ORAL | Status: AC
  Administered 2012-11-23: 8 mg via ORAL
  Filled 2012-11-23: qty 2

## 2012-11-23 NOTE — ED Notes (Signed)
Presents with productive cough, nausea, right sided pain and general body aches. She reports emesis x1. Reports feeling bad since weds. Temp at triage 100.1 orally and HR 124. "I don't know what is going on, I just feel really bad"

## 2012-11-24 ENCOUNTER — Emergency Department (HOSPITAL_COMMUNITY)
Admission: EM | Admit: 2012-11-24 | Discharge: 2012-11-24 | Disposition: A | Discharge status: Home / Self Care | Payer: Self-pay | Attending: Emergency Medicine | Admitting: Emergency Medicine

## 2012-11-24 DIAGNOSIS — J069 Acute upper respiratory infection, unspecified: Secondary | ICD-10-CM

## 2012-11-24 MED ORDER — ACETAMINOPHEN-CODEINE 120-12 MG/5ML PO SUSP: 5.0000 mL | Freq: Four times a day (QID) | ORAL | Status: DC | PRN

## 2012-11-24 MED ORDER — SODIUM CHLORIDE 0.9 % IV BOLUS (SEPSIS)
1000.0000 mL | Freq: Once | INTRAVENOUS | Status: AC
  Administered 2012-11-24: 1000 mL via INTRAVENOUS

## 2012-11-24 MED ORDER — ALBUTEROL SULFATE (5 MG/ML) 0.5% IN NEBU
5.0000 mg | INHALATION_SOLUTION | Freq: Once | RESPIRATORY_TRACT | Status: AC
  Administered 2012-11-24: 5 mg via RESPIRATORY_TRACT
  Filled 2012-11-24 (×2): qty 1

## 2012-11-24 MED ORDER — BENZONATATE 100 MG PO CAPS
100.0000 mg | ORAL_CAPSULE | Freq: Once | ORAL | Status: AC
  Administered 2012-11-24: 100 mg via ORAL
  Filled 2012-11-24: qty 1

## 2012-11-24 MED ORDER — ALBUTEROL SULFATE HFA 108 (90 BASE) MCG/ACT IN AERS
2.0000 | INHALATION_SPRAY | RESPIRATORY_TRACT | Status: DC | PRN
  Administered 2012-11-24: 2 via RESPIRATORY_TRACT
  Filled 2012-11-24: qty 6.7

## 2012-11-24 NOTE — ED Notes (Signed)
Pt states her body was aching Wednesday and it stopped aching and now it only aches on the right side. Pt has strong cough and itchy throat. Cough is painful. Pt took coricidin at home for cough with no relief.

## 2012-11-24 NOTE — ED Provider Notes (Signed)
History     CSN: [id number]  Arrival date & time 11/23/12  2200   First MD Initiated Contact with Patient 11/24/12 [phone]      Chief Complaint  Patient presents with  . Muscle Pain    (Consider location/radiation/quality/duration/timing/severity/associated sxs/prior treatment) HPI History provided by patient. Sick for the last 3 days with cough, cold congestion and now severe cough keeping her up at night, no wheezes, no sick contacts, no recent travel, some sore throat . No N/V/D. Symptoms MOD to severe.   Past Medical History  Diagnosis Date  . Hypertension   . GERD (gastroesophageal reflux disease)     Past Surgical History  Procedure Laterality Date  . Tubal ligation    . Toe surgery      History reviewed. No pertinent family history.  History  Substance Use Topics  . Smoking status: Never Smoker   . Smokeless tobacco: Not on file  . Alcohol Use: No    OB History   Grav Para Term Preterm Abortions TAB SAB Ect Mult Living                  Review of Systems  Constitutional: Negative for fever and chills.  HENT: Positive for congestion and sore throat. Negative for neck pain and neck stiffness.   Eyes: Negative for pain.  Respiratory: Positive for cough. Negative for chest tightness.   Cardiovascular: Negative for chest pain.  Gastrointestinal: Negative for nausea, vomiting, abdominal pain and diarrhea.  Genitourinary: Negative for dysuria.  Musculoskeletal: Negative for back pain.  Skin: Negative for rash.  Neurological: Negative for headaches.  All other systems reviewed and are negative.    Allergies  Aspirin and Ibuprofen  Home Medications   Current Outpatient Rx  Name  Route  Sig  Dispense  Refill  . LORazepam (ATIVAN) 1 MG tablet   Oral   Take 1 tablet (1 mg total) by mouth 3 (three) times daily as needed for anxiety.   10 tablet   0   . omeprazole (PRILOSEC) 20 MG capsule   Oral   Take 1 capsule (20 mg total) by mouth daily.   30  capsule   0   . triamterene-hydrochlorothiazide (MAXZIDE-25) 37.5-25 MG per tablet   Oral   Take 1 each (1 tablet total) by mouth daily.   30 tablet   0     BP 111/78  Pulse 124  Temp(Src) 100.1 F (37.8 C) (Oral)  Resp 20  SpO2 98%  Physical Exam  Constitutional: She is oriented to person, place, and time. She appears well-developed and well-nourished.  HENT:  Head: Normocephalic and atraumatic.  Mouth/Throat: Oropharynx is clear and moist. No oropharyngeal exudate.  Eyes: Conjunctivae and EOM are normal. Pupils are equal, round, and reactive to light.  Neck: Neck supple. No tracheal deviation present.  Cardiovascular: Regular rhythm and intact distal pulses.   tachy  Pulmonary/Chest: Effort normal. No stridor. She has no wheezes.  Dec bilateral breath sounds  Abdominal: Soft. Bowel sounds are normal. She exhibits no distension. There is no rebound.  Musculoskeletal: Normal range of motion. She exhibits no edema.  Neurological: She is alert and oriented to person, place, and time.  Skin: Skin is warm and dry.    ED Course  Procedures (including critical care time)  Results for orders placed during the hospital encounter of 11/24/12  CBC      Result Value Range   WBC 8.0  4.0 - 10.5 K/uL   RBC 4.43  3.87 - 5.11 MIL/uL   Hemoglobin 13.7  12.0 - 15.0 g/dL   HCT 16.1  09.6 - 04.5 %   MCV 86.9  78.0 - 100.0 fL   MCH 30.9  26.0 - 34.0 pg   MCHC 35.6  30.0 - 36.0 g/dL   RDW 40.9  81.1 - 91.4 %   Platelets 276  150 - 400 K/uL  BASIC METABOLIC PANEL      Result Value Range   Sodium 134 (*) 135 - 145 mEq/L   Potassium 3.6  3.5 - 5.1 mEq/L   Chloride 96  96 - 112 mEq/L   CO2 26  19 - 32 mEq/L   Glucose, Bld 137 (*) 70 - 99 mg/dL   BUN 13  6 - 23 mg/dL   Creatinine, Ser 7.82  0.50 - 1.10 mg/dL   Calcium 9.8  8.4 - 95.6 mg/dL   GFR calc non Af Amer 74 (*) >90 mL/min   GFR calc Af Amer 85 (*) >90 mL/min   Dg Chest 2 View (if Patient Has Fever And/or  Copd)  11/23/2012   *RADIOLOGY REPORT*  Clinical Data: Muscle pain  CHEST - 2 VIEW  Comparison: 10/24/2012  Findings: The heart size and mediastinal contours are within normal limits.  Both lungs are clear.  The visualized skeletal structures are unremarkable.  IMPRESSION: Negative exam.   Original Report Authenticated By: Signa Kell, M.D.   Albuterol, tylenol, tessalon, IVFs  4:50 AM recheck feeling much better, lungs CTA - albuterol did improve her symptoms, sent home with inhaler, Rx antitussive and plan close follow up  MDM  URI/ cough and LGF CXR, labs IVFs, medications provided VS and nursing notes reviewed       Sunnie Nielsen, MD 11/24/12 [phone]

## 2012-11-24 NOTE — ED Notes (Signed)
Pt states understanding of discharge instructions 

## 2012-11-24 NOTE — ED Notes (Signed)
Pt states cough has improved.

## 2013-01-20 ENCOUNTER — Other Ambulatory Visit (HOSPITAL_COMMUNITY): Payer: Self-pay | Admitting: Family Medicine

## 2013-01-20 DIAGNOSIS — Z1231 Encounter for screening mammogram for malignant neoplasm of breast: Secondary | ICD-10-CM

## 2014-02-02 ENCOUNTER — Encounter (HOSPITAL_COMMUNITY): Payer: Self-pay | Admitting: Emergency Medicine

## 2014-02-02 ENCOUNTER — Emergency Department (HOSPITAL_COMMUNITY)
Admission: EM | Admit: 2014-02-02 | Discharge: 2014-02-02 | Disposition: A | Discharge status: Home / Self Care | Payer: Medicaid Other | Attending: Emergency Medicine | Admitting: Emergency Medicine

## 2014-02-02 DIAGNOSIS — I10 Essential (primary) hypertension: Secondary | ICD-10-CM | POA: Diagnosis not present

## 2014-02-02 DIAGNOSIS — Z79899 Other long term (current) drug therapy: Secondary | ICD-10-CM | POA: Insufficient documentation

## 2014-02-02 DIAGNOSIS — R21 Rash and other nonspecific skin eruption: Secondary | ICD-10-CM | POA: Insufficient documentation

## 2014-02-02 DIAGNOSIS — K219 Gastro-esophageal reflux disease without esophagitis: Secondary | ICD-10-CM | POA: Diagnosis not present

## 2014-02-02 MED ORDER — PERMETHRIN 5 % EX CREA: TOPICAL_CREAM | CUTANEOUS | Status: DC

## 2014-02-02 MED ORDER — HYDROXYZINE HCL 25 MG PO TABS: 25.0000 mg | ORAL_TABLET | Freq: Four times a day (QID) | ORAL | Status: DC

## 2014-02-02 MED ORDER — NYSTATIN-TRIAMCINOLONE 100000-0.1 UNIT/GM-% EX CREA: TOPICAL_CREAM | CUTANEOUS | Status: DC

## 2014-02-02 MED ORDER — HYDROXYZINE HCL 10 MG PO TABS
10.0000 mg | ORAL_TABLET | Freq: Once | ORAL | Status: AC
  Administered 2014-02-02: 10 mg via ORAL
  Filled 2014-02-02: qty 1

## 2014-02-02 NOTE — ED Notes (Signed)
Pt here for rash in groin area and itching all over. Pt states she is dealing with roach infestation at home and is currently trying to get rid of them. Pt thinks she may have gotten bit by [employer]. Pt states she was taking a cream for itching but it is no longer working.

## 2014-02-02 NOTE — ED Provider Notes (Signed)
CSN: [id number]     Arrival date & time 02/02/14  2005 History  This chart was scribed for non-physician provider Dierdre Forth, PA-C, working with Gerhard Munch, MD by Phillis Haggis, ED Scribe. This patient was seen in room TR04C/TR04C and patient care was started at 9:49 PM.    Chief Complaint  Patient presents with  . Rash   The history is provided by the patient and medical records. No language interpreter was used.   HPI Comments: [name] is a 58 y.o. female who presents to the Emergency Department complaining of a right groin rash onset two weeks ago. She states that she thought the rash was an ingrown hair, but it has continued to spread and she is now itching all over. She states that she does not have this rash on her left groin. She denies history of yeast infection, wet bathing suits, water parks, etc. She states that the rash is very itchy and has been scratching all over. She reports that her apartment has recently been infested by [employer] and believes this might have contributed to the rash. She denies trying anything for the itching. She states that she has been spraying Raid around her apartment to attempt to kill the bugs.    Past Medical History  Diagnosis Date  . Hypertension   . GERD (gastroesophageal reflux disease)    Past Surgical History  Procedure Laterality Date  . Tubal ligation    . Toe surgery     No family history on file. History  Substance Use Topics  . Smoking status: Never Smoker   . Smokeless tobacco: Not on file  . Alcohol Use: No   OB History   Grav Para Term Preterm Abortions TAB SAB Ect Mult Living                 Review of Systems  Constitutional: Negative for fever, chills, diaphoresis, appetite change, fatigue and unexpected weight change.  HENT: Negative for mouth sores.   Respiratory: Negative for cough, chest tightness, shortness of breath and wheezing.   Cardiovascular: Negative for chest pain.  Gastrointestinal:  Negative for nausea, vomiting, abdominal pain, diarrhea and constipation.  Endocrine: Negative for polydipsia, polyphagia and polyuria.  Genitourinary: Negative for dysuria, urgency, frequency and hematuria.  Musculoskeletal: Negative for back pain and neck stiffness.  Skin: Positive for rash and wound.  Allergic/Immunologic: Negative for immunocompromised state.  Neurological: Negative for syncope, light-headedness and headaches.  Hematological: Does not bruise/bleed easily.  Psychiatric/Behavioral: Negative for sleep disturbance. The patient is not nervous/anxious.       Allergies  Aspirin and Ibuprofen  Home Medications   Prior to Admission medications   Medication Sig Start Date End Date Taking? Authorizing Provider  LORazepam (ATIVAN) 1 MG tablet Take 1 tablet (1 mg total) by mouth 3 (three) times daily as needed for anxiety. 10/24/12  Yes Gwyneth Sprout, MD  omeprazole (PRILOSEC) 20 MG capsule Take 1 capsule (20 mg total) by mouth daily. 10/24/12  Yes Roxy Horseman, PA-C  triamterene-hydrochlorothiazide (MAXZIDE-25) 37.5-25 MG per tablet Take 1 each (1 tablet total) by mouth daily. 10/24/12  Yes Roxy Horseman, PA-C  hydrOXYzine (ATARAX/VISTARIL) 25 MG tablet Take 1 tablet (25 mg total) by mouth every 6 (six) hours. 02/02/14   Shaul Trautman, PA-C  nystatin-triamcinolone (MYCOLOG II) cream Apply to affected area daily 02/02/14   Dahlia Client Maritssa Haughton, PA-C  permethrin (ELIMITE) 5 % cream Apply to affected area once 02/02/14   Dahlia Client Shabreka Coulon, PA-C   BP 160/111  Pulse 74  Temp(Src) 98.4 F (36.9 C) (Oral)  Resp 16  SpO2 99% Physical Exam  Nursing note and vitals reviewed. Constitutional: She appears well-developed and well-nourished. No distress.  Awake, alert, nontoxic appearance  HENT:  Head: Normocephalic and atraumatic.  Mouth/Throat: Oropharynx is clear and moist. No oropharyngeal exudate.  Eyes: Conjunctivae are normal. No scleral icterus.  Neck: Normal range  of motion. Neck supple.  Cardiovascular: Normal rate, regular rhythm, normal heart sounds and intact distal pulses.   No murmur heard. Pulmonary/Chest: Effort normal and breath sounds normal. No respiratory distress. She has no wheezes.  Abdominal: Soft. Bowel sounds are normal. She exhibits no distension and no mass. There is no tenderness. There is no rebound and no guarding.  Genitourinary:  Excoriated lesions on the right or in with erythematous and papular lesions involving the mons pubis no evidence of rash in the left groin  Musculoskeletal: Normal range of motion. She exhibits no edema.  Lymphadenopathy:  No lymphadenopathy  Neurological: She is alert.  Speech is clear and goal oriented Moves extremities without ataxia  Skin: Skin is warm and dry. Rash noted. She is not diaphoretic.  Papular rash noted under the bilateral breasts, bilateral axilla, bilateral antecubital fossa all significantly excoriated  Psychiatric: She has a normal mood and affect.    ED Course  Procedures (including critical care time) DIAGNOSTIC STUDIES: Oxygen Saturation is 99% on room air, normal by my interpretation.    COORDINATION OF CARE: 9:53 PM-Discussed treatment plan which includes hydrocortisone cream, Rid on fabric surfaces in apartment, and anti-itching medication with pt at bedside and pt agreed to plan.   Labs Review Labs Reviewed - No data to display  Imaging Review No results found.   EKG Interpretation None      MDM   Final diagnoses:  Rash and nonspecific skin eruption   [name] presents with rash in the flexural folds, extremely excoriated without induration or evidence of secondary infection.  Rash is not specifically consistent with scabies however we will treat as such in case this is the cause of her rash.  No vesicles to suggest contact dermatitis the patient denies changes in lotions, creams, detergents.  Rash is inconsistent with Candida the patient denies risk  factors for candidal infestation.  She does report home is infested with roaches and she believes this may be the cause of her rash.  Recommend use of RID in addition to professional extermination.  Patient noted to be hypertensive in the emergency department.  No signs of hypertensive urgency.  Discussed with patient the need for close follow-up and management by their primary care physician.  Pt has a hx of same and reports she is not taking medication at this time for her HTN.   I have personally reviewed patient's vitals, nursing note and any pertinent labs or imaging.  I performed an undressed physical exam.    At this time, it has been determined that no acute conditions requiring further emergency intervention. The patient/guardian have been advised of the diagnosis and plan. I reviewed all labs and imaging including any potential incidental findings. We have discussed signs and symptoms that warrant return to the ED, such as fever, chills, worsening rash.  Patient/guardian has voiced understanding and agreed to follow-up with the PCP or specialist in 3 days for wound check.  Vital signs are stable at discharge.   BP 160/111  Pulse 74  Temp(Src) 98.4 F (36.9 C) (Oral)  Resp 16  SpO2 99%  I personally performed the services described in this documentation, which was scribed in my presence. The recorded information has been reviewed and is accurate.      Dahlia ClientHannah Jasira Robinson, PA-C 02/02/14 2234

## 2014-02-02 NOTE — ED Notes (Signed)
Discharge and follow up instructions reviewed with pt. Pt's blood pressure was elevated, pt informed this RN that she hadn't taken her blood pressure medicine for a week. This RN reviewed with pt the importance of making sure she took her blood pressure medications as prescribed. Pt verbalized understanding.

## 2014-02-02 NOTE — Discharge Instructions (Signed)
1. Medications: atarax, triamcinolone, permetherin, usual home medications 2. Treatment: rest, drink plenty of fluids, keep rash clean with warm soap and water 3. Follow Up: Please followup with your primary doctor in 3 days for discussion of your diagnoses and further evaluation after today's visit; if you do not have a primary care doctor use the resource guide provided to find one; return to the emergency department for fever worsening rash  Scabies Scabies are small bugs (mites) that burrow under the skin and cause red bumps and severe itching. These bugs can only be seen with a microscope. Scabies are highly contagious. They can spread easily from person to person by direct contact. They are also spread through sharing clothing or linens that have the scabies mites living in them. It is not unusual for an entire family to become infected through shared towels, clothing, or bedding.  HOME CARE INSTRUCTIONS   Your caregiver may prescribe a cream or lotion to kill the mites. If cream is prescribed, massage the cream into the entire body from the neck to the bottom of both feet. Also massage the cream into the scalp and face if your child is less than 59 year old. Avoid the eyes and mouth. Do not wash your hands after application.  Leave the cream on for 8 to 12 hours. Your child should bathe or shower after the 8 to 12 hour application period. Sometimes it is helpful to apply the cream to your child right before bedtime.  One treatment is usually effective and will eliminate approximately 95% of infestations. For severe cases, your caregiver may decide to repeat the treatment in 1 week. Everyone in your household should be treated with one application of the cream.  New rashes or burrows should not appear within 24 to 48 hours after successful treatment. However, the itching and rash may last for 2 to 4 weeks after successful treatment. Your caregiver may prescribe a medicine to help with the itching  or to help the rash go away more quickly.  Scabies can live on clothing or linens for up to 3 days. All of your child's recently used clothing, towels, stuffed toys, and bed linens should be washed in hot water and then dried in a dryer for at least 20 minutes on high heat. Items that cannot be washed should be enclosed in a plastic bag for at least 3 days.  To help relieve itching, bathe your child in a cool bath or apply cool washcloths to the affected areas.  Your child may return to school after treatment with the prescribed cream. SEEK MEDICAL CARE IF:   The itching persists longer than 4 weeks after treatment.  The rash spreads or becomes infected. Signs of infection include red blisters or yellow-tan crust. Document Released: 06/26/2005 Document Revised: 09/18/2011 Document Reviewed: 11/04/2008 [facility] Patient Information 2015 [employer]. This information is not intended to replace advice given to you by your health care provider. Make sure you discuss any questions you have with your health care provider.

## 2014-02-02 NOTE — ED Notes (Signed)
Pt. reports itchy rashes at right groin for 2 weeks .

## 2014-02-03 NOTE — ED Provider Notes (Signed)
  Medical screening examination/treatment/procedure(s) were performed by non-physician practitioner and as supervising physician I was immediately available for consultation/collaboration.   EKG Interpretation None         Mackinzie Vuncannon, MD 02/03/14 0014 

## 2014-05-26 ENCOUNTER — Encounter (HOSPITAL_BASED_OUTPATIENT_CLINIC_OR_DEPARTMENT_OTHER): Payer: Medicaid Other | Attending: General Surgery

## 2014-05-26 DIAGNOSIS — X58XXXA Exposure to other specified factors, initial encounter: Secondary | ICD-10-CM | POA: Insufficient documentation

## 2014-05-26 DIAGNOSIS — Y999 Unspecified external cause status: Secondary | ICD-10-CM | POA: Diagnosis not present

## 2014-05-26 DIAGNOSIS — L97519 Non-pressure chronic ulcer of other part of right foot with unspecified severity: Secondary | ICD-10-CM | POA: Insufficient documentation

## 2014-05-26 DIAGNOSIS — Y9389 Activity, other specified: Secondary | ICD-10-CM | POA: Insufficient documentation

## 2014-05-26 DIAGNOSIS — Y929 Unspecified place or not applicable: Secondary | ICD-10-CM | POA: Insufficient documentation

## 2014-05-27 NOTE — H&P (Signed)
NAMChryl Heck:  [name], [name]                 ACCOUNT NO.:  [id number]  MEDICAL RECORD NO.:  [id number]  LOCATION:  FOOT                         FACILITY:  [facility]  PHYSICIAN:  Joanne Gaveloy Samanthia Howland, M.D.        DATE OF BIRTH:  07-07-1956  DATE OF ADMISSION:  05/26/2014 DATE OF DISCHARGE:                             HISTORY & PHYSICAL   CHIEF COMPLAINT:  Wound on right foot.  HISTORY OF PRESENT ILLNESS:  This is a 58 year old female, nondiabetic. She developed a very distressing case of athlete's foot, treated with several different antifungals, but she began scratching between her toes and developed a wound on the right foot.  PAST MEDICAL HISTORY:  Significant for: 1. Hypertension. 2. GERD. 3. Osteoarthritis. 4. Anxiety.  PAST SURGICAL HISTORY:  Significant for toe surgery.  SOCIAL HISTORY:  Cigarettes none.  Alcohol none.  ALLERGIES:  ASPIRIN, IBUPROFEN.  MEDICATIONS:  Prilosec, Maxzide, hydrocortisone cream, Texapro, gabapentin and Mobic.  REVIEW OF SYSTEMS:  Essentially as above.  PHYSICAL EXAMINATION:  VITAL SIGNS:  Temperature 98.5, pulse 72, respirations 18, blood pressure 159/91. GENERAL APPEARANCE:  Well developed, well nourished, no distress. CHEST:  Clear. HEART:  Regular rhythm. EXTREMITIES:  Examining the right foot reveals dorsalis pedis pulse is palpable in between the second and third toe.  There is a tiny opening, from which, pus can be expressed.  Unfortunately, this is extremely tender and she will not allow me a further examination.  She has no other ulcerations between her toes.  IMPRESSION:  Trauma secondary to itching and exuberant scratching.  PLAN OF TREATMENT:  Dr. Lajoyce Cornersuda has kindly agreed to see the patient today and he may have to explore this wound under a light anesthetic.     Joanne Gaveloy Refoel Palladino, M.D.     RA/MEDQ  D:  05/26/2014  T:  05/27/2014  Job:  [id number]  cc:   Nadara MustardMarcus V. Duda, MD

## 2014-07-07 ENCOUNTER — Emergency Department (HOSPITAL_COMMUNITY): Payer: Medicaid Other

## 2014-07-07 ENCOUNTER — Encounter (HOSPITAL_COMMUNITY): Payer: Self-pay | Admitting: *Deleted

## 2014-07-07 ENCOUNTER — Emergency Department (HOSPITAL_COMMUNITY)
Admission: EM | Admit: 2014-07-07 | Discharge: 2014-07-07 | Disposition: A | Discharge status: Home / Self Care | Payer: Medicaid Other | Attending: Emergency Medicine | Admitting: Emergency Medicine

## 2014-07-07 DIAGNOSIS — I10 Essential (primary) hypertension: Secondary | ICD-10-CM | POA: Insufficient documentation

## 2014-07-07 DIAGNOSIS — K219 Gastro-esophageal reflux disease without esophagitis: Secondary | ICD-10-CM | POA: Insufficient documentation

## 2014-07-07 DIAGNOSIS — Z79899 Other long term (current) drug therapy: Secondary | ICD-10-CM | POA: Diagnosis not present

## 2014-07-07 DIAGNOSIS — J069 Acute upper respiratory infection, unspecified: Secondary | ICD-10-CM | POA: Diagnosis not present

## 2014-07-07 DIAGNOSIS — R0602 Shortness of breath: Secondary | ICD-10-CM | POA: Insufficient documentation

## 2014-07-07 DIAGNOSIS — R059 Cough, unspecified: Secondary | ICD-10-CM

## 2014-07-07 DIAGNOSIS — R05 Cough: Secondary | ICD-10-CM | POA: Diagnosis present

## 2014-07-07 LAB — CBC WITH DIFFERENTIAL/PLATELET
Basophils Absolute: 0 10*3/uL (ref 0.0–0.1)
Basophils Relative: 0 % (ref 0–1)
Eosinophils Absolute: 0.3 10*3/uL (ref 0.0–0.7)
Eosinophils Relative: 3 % (ref 0–5)
HCT: 39.3 % (ref 36.0–46.0)
Hemoglobin: 13 g/dL (ref 12.0–15.0)
Lymphocytes Relative: 30 % (ref 12–46)
Lymphs Abs: 2.4 10*3/uL (ref 0.7–4.0)
MCH: 29.9 pg (ref 26.0–34.0)
MCHC: 33.1 g/dL (ref 30.0–36.0)
MCV: 90.3 fL (ref 78.0–100.0)
Monocytes Absolute: 0.4 10*3/uL (ref 0.1–1.0)
Monocytes Relative: 5 % (ref 3–12)
Neutro Abs: 5 10*3/uL (ref 1.7–7.7)
Neutrophils Relative %: 62 % (ref 43–77)
Platelets: 249 10*3/uL (ref 150–400)
RBC: 4.35 MIL/uL (ref 3.87–5.11)
RDW: 12.3 % (ref 11.5–15.5)
WBC: 8.1 10*3/uL (ref 4.0–10.5)

## 2014-07-07 LAB — COMPREHENSIVE METABOLIC PANEL
ALT: 22 U/L (ref 0–35)
AST: 25 U/L (ref 0–37)
Albumin: 3.9 g/dL (ref 3.5–5.2)
Alkaline Phosphatase: 73 U/L (ref 39–117)
Anion gap: 10 (ref 5–15)
BUN: 6 mg/dL (ref 6–23)
CO2: 26 mmol/L (ref 19–32)
Calcium: 9.4 mg/dL (ref 8.4–10.5)
Chloride: 103 mEq/L (ref 96–112)
Creatinine, Ser: 0.71 mg/dL (ref 0.50–1.10)
GFR calc Af Amer: >90 mL/min (ref >90)
GFR calc non Af Amer: >90 mL/min (ref >90)
Glucose, Bld: 99 mg/dL (ref 70–99)
Potassium: 3.8 mmol/L (ref 3.5–5.1)
Sodium: 139 mmol/L (ref 135–145)
Total Bilirubin: 0.7 mg/dL (ref 0.3–1.2)
Total Protein: 7.5 g/dL (ref 6.0–8.3)

## 2014-07-07 MED ORDER — DM-GUAIFENESIN ER 30-600 MG PO TB12
1.0000 | ORAL_TABLET | Freq: Two times a day (BID) | ORAL | Status: DC
Start: 2014-07-07 — End: 2021-03-09

## 2014-07-07 MED ORDER — ACETAMINOPHEN 500 MG PO TABS: 500.0000 mg | ORAL_TABLET | Freq: Four times a day (QID) | ORAL | Status: DC | PRN

## 2014-07-07 MED ORDER — DM-GUAIFENESIN ER 30-600 MG PO TB12
1.0000 | ORAL_TABLET | Freq: Two times a day (BID) | ORAL | Status: DC
  Administered 2014-07-07: 1 via ORAL
  Filled 2014-07-07: qty 1

## 2014-07-07 MED ORDER — DM-GUAIFENESIN ER 30-600 MG PO TB12: 1.0000 | ORAL_TABLET | Freq: Two times a day (BID) | ORAL | Status: DC

## 2014-07-07 NOTE — Discharge Instructions (Signed)
Upper Respiratory Infection, Adult An upper respiratory infection (URI) is also sometimes known as the common cold. The upper respiratory tract includes the nose, sinuses, throat, trachea, and bronchi. Bronchi are the airways leading to the lungs. Most people improve within 1 week, but symptoms can last up to 2 weeks. A residual cough may last even longer.  CAUSES Many different viruses can infect the tissues lining the upper respiratory tract. The tissues become irritated and inflamed and often become very moist. Mucus production is also common. A cold is contagious. You can easily spread the virus to others by oral contact. This includes kissing, sharing a Tinch, coughing, or sneezing. Touching your mouth or nose and then touching a surface, which is then touched by another person, can also spread the virus. SYMPTOMS  Symptoms typically develop 1 to 3 days after you come in contact with a cold virus. Symptoms vary from person to person. They may include:  Runny nose.  Sneezing.  Nasal congestion.  Sinus irritation.  Sore throat.  Loss of voice (laryngitis).  Cough.  Fatigue.  Muscle aches.  Loss of appetite.  Headache.  Low-grade fever. DIAGNOSIS  You might diagnose your own cold based on familiar symptoms, since most people get a cold 2 to 3 times a year. Your caregiver can confirm this based on your exam. Most importantly, your caregiver can check that your symptoms are not due to another disease such as strep throat, sinusitis, pneumonia, asthma, or epiglottitis. Blood tests, throat tests, and X-rays are not necessary to diagnose a common cold, but they may sometimes be helpful in excluding other more serious diseases. Your caregiver will decide if any further tests are required. RISKS AND COMPLICATIONS  You may be at risk for a more severe case of the common cold if you smoke cigarettes, have chronic heart disease (such as heart failure) or lung disease (such as asthma), or if  you have a weakened immune system. The very young and very old are also at risk for more serious infections. Bacterial sinusitis, middle ear infections, and bacterial pneumonia can complicate the common cold. The common cold can worsen asthma and chronic obstructive pulmonary disease (COPD). Sometimes, these complications can require emergency medical care and may be life-threatening. PREVENTION  The best way to protect against getting a cold is to practice good hygiene. Avoid oral or hand contact with people with cold symptoms. Wash your hands often if contact occurs. There is no clear evidence that vitamin C, vitamin E, echinacea, or exercise reduces the chance of developing a cold. However, it is always recommended to get plenty of rest and practice good nutrition. TREATMENT  Treatment is directed at relieving symptoms. There is no cure. Antibiotics are not effective, because the infection is caused by a virus, not by bacteria. Treatment may include:  Increased fluid intake. Sports drinks offer valuable electrolytes, sugars, and fluids.  Breathing heated mist or steam (vaporizer or shower).  Eating chicken soup or other clear broths, and maintaining good nutrition.  Getting plenty of rest.  Using gargles or lozenges for comfort.  Controlling fevers with ibuprofen or acetaminophen as directed by your caregiver.  Increasing usage of your inhaler if you have asthma. Zinc gel and zinc lozenges, taken in the first 24 hours of the common cold, can shorten the duration and lessen the severity of symptoms. Pain medicines may help with fever, muscle aches, and throat pain. A variety of non-prescription medicines are available to treat congestion and runny nose. Your caregiver   can make recommendations and may suggest nasal or lung inhalers for other symptoms.  HOME CARE INSTRUCTIONS   Only take over-the-counter or prescription medicines for pain, discomfort, or fever as directed by your  caregiver.  Use a warm mist humidifier or inhale steam from a shower to increase air moisture. This may keep secretions moist and make it easier to breathe.  Drink enough water and fluids to keep your urine clear or pale yellow.  Rest as needed.  Return to work when your temperature has returned to normal or as your caregiver advises. You may need to stay home longer to avoid infecting others. You can also use a face mask and careful hand washing to prevent spread of the virus. SEEK MEDICAL CARE IF:   After the first few days, you feel you are getting worse rather than better.  You need your caregiver's advice about medicines to control symptoms.  You develop chills, worsening shortness of breath, or brown or red sputum. These may be signs of pneumonia.  You develop yellow or brown nasal discharge or pain in the face, especially when you bend forward. These may be signs of sinusitis.  You develop a fever, swollen neck glands, pain with swallowing, or white areas in the back of your throat. These may be signs of strep throat. SEEK IMMEDIATE MEDICAL CARE IF:   You have a fever.  You develop severe or persistent headache, ear pain, sinus pain, or chest pain.  You develop wheezing, a prolonged cough, cough up blood, or have a change in your usual mucus (if you have chronic lung disease).  You develop sore muscles or a stiff neck. Document Released: 12/20/2000 Document Revised: 09/18/2011 Document Reviewed: 10/01/2013 Exit[employer] Patient Information 2015 [employer]. This information is not intended to replace advice given to you by your health care provider. Make sure you discuss any questions you have with your health care provider.  

## 2014-07-07 NOTE — ED Notes (Signed)
Pt in c/o sore throat, cough, and fatigue for the last few days, unsure of fever, shortness of breath with exertion, no distress noted

## 2014-07-07 NOTE — ED Provider Notes (Signed)
CSN: [id number]     Arrival date & time 07/07/14  1455 History   First MD Initiated Contact with Patient 07/07/14 1909     Chief Complaint  Patient presents with  . URI    [name] is a 58 y.o. female with a history of hypertension and GERD who presents the emergency department complaining of sore throat, and cough for the past 2 days. Patient reports she started having a nonproductive cough and sore throat 2 days ago associated with watery eyes and intermittent headache. Patient reports her face hurts somewhat. The patient reports transient shortness of breath associated with her coughing spells. Patient denies current shortness of breath in the room. Patient reports she's tried Benadryl without relief. Patient reports that a family member also has upper respiratory infection symptoms at home. Patient denies history of asthma or COPD. The patient denies ever smoking. Patient reports she's been eating and drinking well. Patient denies fevers, chills, abdominal pain, nausea, vomiting, chest pain, palpitations, rashes, nasal congestion, ear pain, ear discharge, or eye redness. Patient denies personal history of cardiovascular disease. The patient denies personal or family history of PEs or DVTs.  (Consider location/radiation/quality/duration/timing/severity/associated sxs/prior Treatment) HPI  Past Medical History  Diagnosis Date  . Hypertension   . GERD (gastroesophageal reflux disease)    Past Surgical History  Procedure Laterality Date  . Tubal ligation    . Toe surgery     History reviewed. No pertinent family history. History  Substance Use Topics  . Smoking status: Never Smoker   . Smokeless tobacco: Not on file  . Alcohol Use: No   OB History    No data available     Review of Systems  Constitutional: Negative for fever, chills and appetite change.  HENT: Positive for sore throat. Negative for congestion, ear discharge, ear pain, facial swelling, postnasal drip, sinus  pressure and trouble swallowing.   Eyes: Negative for pain and visual disturbance.  Respiratory: Positive for cough and shortness of breath. Negative for chest tightness, wheezing and stridor.   Cardiovascular: Negative for chest pain, palpitations and leg swelling.  Gastrointestinal: Negative for nausea, vomiting, abdominal pain and diarrhea.  Genitourinary: Negative for dysuria, hematuria and difficulty urinating.  Musculoskeletal: Negative for back pain, neck pain and neck stiffness.  Skin: Negative for rash.  Neurological: Negative for dizziness, weakness, light-headedness, numbness and headaches.  All other systems reviewed and are negative.     Allergies  Aspirin and Ibuprofen  Home Medications   Prior to Admission medications   Medication Sig Start Date End Date Taking? Authorizing Provider  escitalopram (LEXAPRO) 10 MG tablet Take 10 mg by mouth daily.   Yes Historical Provider, MD  omeprazole (PRILOSEC) 20 MG capsule Take 1 capsule (20 mg total) by mouth daily. 10/24/12  Yes Roxy Horseman, PA-C  triamterene-hydrochlorothiazide (MAXZIDE-25) 37.5-25 MG per tablet Take 1 each (1 tablet total) by mouth daily. 10/24/12  Yes Roxy Horseman, PA-C  acetaminophen (TYLENOL) 500 MG tablet Take 1 tablet (500 mg total) by mouth every 6 (six) hours as needed. 07/07/14   Einar Gip Elford Evilsizer, PA-C  dextromethorphan-guaiFENesin [facility] DM) 30-600 MG per 12 hr tablet Take 1 tablet by mouth 2 (two) times daily. 07/07/14   Einar Gip Tonika Eden, PA-C  hydrOXYzine (ATARAX/VISTARIL) 25 MG tablet Take 1 tablet (25 mg total) by mouth every 6 (six) hours. Patient not taking: Reported on 07/07/2014 02/02/14   Dahlia Client Muthersbaugh, PA-C  LORazepam (ATIVAN) 1 MG tablet Take 1 tablet (1 mg total) by mouth  3 (three) times daily as needed for anxiety. Patient not taking: Reported on 07/07/2014 10/24/12   Gwyneth SproutWhitney Plunkett, MD  nystatin-triamcinolone [facility](MYCOLOG II) cream Apply to affected area daily Patient  not taking: Reported on 07/07/2014 02/02/14   Dahlia ClientHannah Muthersbaugh, PA-C  permethrin (ELIMITE) 5 % cream Apply to affected area once Patient not taking: Reported on 07/07/2014 02/02/14   Dahlia ClientHannah Muthersbaugh, PA-C   BP 151/75 mmHg  Pulse 79  Temp(Src) 98.6 F (37 C) (Oral)  Resp 22  Ht 4\' 11"  (1.499 m)  Wt 170 lb (77.111 kg)  BMI 34.32 kg/m2  SpO2 97% Physical Exam  Constitutional: She is oriented to person, place, and time. She appears well-developed and well-nourished. No distress.  HENT:  Head: Normocephalic and atraumatic.  Right Ear: External ear normal.  Left Ear: External ear normal.  Nose: Nose normal.  Mouth/Throat: Oropharynx is clear and moist. No oropharyngeal exudate.  Bilateral external auditory canals are occluded with cerumen. No tonsillar hypertrophy or exudates. No oral pharyngeal erythema. Patient's uvula is midline without edema. No facial swelling noted. No ethmoid, maxillary or frontal sinus tenderness.  Eyes: Conjunctivae are normal. Pupils are equal, round, and reactive to light. Right eye exhibits no discharge. Left eye exhibits no discharge.  Neck: Normal range of motion. Neck supple.  Cardiovascular: Normal rate, regular rhythm, normal heart sounds and intact distal pulses.  Exam reveals no gallop and no friction rub.   No murmur heard. Pulmonary/Chest: Effort normal and breath sounds normal. No respiratory distress. She has no wheezes. She has no rales.  Lungs are clear to auscultation bilaterally.  Abdominal: Soft. She exhibits no distension. There is no tenderness.  Musculoskeletal: She exhibits no edema.  Lymphadenopathy:    She has no cervical adenopathy.  Neurological: She is alert and oriented to person, place, and time. Coordination normal.  Skin: Skin is warm and dry. No rash noted. She is not diaphoretic. No erythema. No pallor.  Psychiatric: She has a normal mood and affect. Her behavior is normal.  Nursing note and vitals reviewed.   ED Course   Procedures (including critical care time) Labs Review Labs Reviewed  CBC WITH DIFFERENTIAL  COMPREHENSIVE METABOLIC PANEL    Imaging Review Dg Chest 2 View  07/07/2014   CLINICAL DATA:  Cough.  EXAM: CHEST  2 VIEW  COMPARISON:  11/23/2012  FINDINGS: The heart size and mediastinal contours are within normal limits. Both lungs are clear. The visualized skeletal structures are unremarkable.  IMPRESSION: Normal chest.   Electronically Signed   By: Geanie CooleyJim  Maxwell M.D.   On: 07/07/2014 16:47     EKG Interpretation None      Filed Vitals:   07/07/14 1744 07/07/14 1932 07/07/14 2000 07/07/14 2030  BP: 149/80 148/81 151/75 151/75  Pulse: 94 85 84 79  Temp:  98.9 F (37.2 C)  98.6 F (37 C)  TempSrc:  Oral  Oral  Resp: 20 20  22   Height:      Weight:      SpO2: 99% 97% 98% 97%     MDM   Meds given in ED:  Medications - No data to display  Discharge Medication List as of 07/07/2014  8:43 PM      Final diagnoses:  Upper respiratory infection, viral   Patient presents to the emergency department with 2 day history of cough and sore throat. Patient is afebrile and nontoxic appearing. The patient is not Tachycardic. The patient is not hypoxic or tachypneic. Patient's lungs are clear  to auscultation bilaterally. The patient's throat has no exudates or erythema. Patient's CBC and CMP are unremarkable. The patient's chest x-ray is unremarkable. Will treat this patient for a viral upper respiratory infection. Education provided on symptomatically treatment of rectal arrest reinfections. The patient is provided a prescription for Mucinex DM and Tylenol. The maximum daily dose of acetaminophen was discussed with the patient. She was encouraged not to exceed 4,000 mg of acetaminophen during a 24 hour period and was asked to keep in mind that acetaminophen can also be found in many over-the-counter cold medications as well as narcotics that may be given for pain. The patient expresses  understanding of these issues and questions were answered. I advised patient she needs to follow-up with her primary care provider for continued symptoms. Advised the patient to return to the emergency department with new or worsening symptoms or new concerns. The patient verbalized understanding and agreement with plan.  This patient was discussed with Dr. Juleen ChinaKohut who agrees with assessment and plan.     Lawana ChambersWilliam Duncan Pansey Pinheiro, PA-C 07/08/14 [phone]  Raeford RazorStephen Kohut, MD 07/09/14 [phone]

## 2015-11-03 ENCOUNTER — Ambulatory Visit: Payer: Medicaid Other | Attending: Family Medicine | Admitting: Physical Therapy

## 2015-11-29 ENCOUNTER — Encounter: Payer: Self-pay | Admitting: Gastroenterology

## 2016-08-20 ENCOUNTER — Emergency Department (HOSPITAL_COMMUNITY)
Admission: EM | Admit: 2016-08-20 | Discharge: 2016-08-20 | Disposition: A | Discharge status: Home / Self Care | Payer: Medicaid Other | Attending: Emergency Medicine | Admitting: Emergency Medicine

## 2016-08-20 ENCOUNTER — Encounter (HOSPITAL_COMMUNITY): Payer: Self-pay | Admitting: Emergency Medicine

## 2016-08-20 DIAGNOSIS — J069 Acute upper respiratory infection, unspecified: Secondary | ICD-10-CM

## 2016-08-20 DIAGNOSIS — I10 Essential (primary) hypertension: Secondary | ICD-10-CM | POA: Insufficient documentation

## 2016-08-20 DIAGNOSIS — B9789 Other viral agents as the cause of diseases classified elsewhere: Secondary | ICD-10-CM

## 2016-08-20 DIAGNOSIS — Z79899 Other long term (current) drug therapy: Secondary | ICD-10-CM | POA: Insufficient documentation

## 2016-08-20 NOTE — ED Provider Notes (Signed)
[facility]EM[facility]Y DEPT Provider Note   CSN: [id number] Arrival date & time: 08/20/16  5621     History   Chief Complaint Chief Complaint  Patient presents with  . Back Pain  . URI    HPI [name] is a 61 y.o. female with a PMHx of GERD, HTN, and headaches, who presents to the ED with complaints of URI symptoms 4 days. Symptoms include sore throat, productive cough with unclear color of sputum, chills, and sinus congestion. She has been trying Delsym with no relief, symptoms seem to worsen at night, no known alleviating factors. She reports that several days ago after taking Delsym she had some mild left lower back aching, but it self resolved and has not returned. She is a nonsmoker, no known sick contacts, no recent travel. She denies fevers, chills, ear pain/drainage, rhinorrhea, drooling, trismus, wheezing, CP, SOB, abd pain, N/V/D/C, hematuria, dysuria, myalgias, arthralgias, ongoing back pain, numbness, tingling, weakness, rashes, or any other complaints at this time.    The history is provided by the patient and medical records. No language interpreter was used.  URI   This is a new problem. The current episode started more than 2 days ago. The problem has not changed since onset.There has been no fever. Associated symptoms include congestion, sore throat and cough. Pertinent negatives include no chest pain, no abdominal pain, no diarrhea, no nausea, no vomiting, no dysuria, no ear pain, no rhinorrhea and no wheezing. She has tried other medications for the symptoms. The treatment provided no relief.    Past Medical History:  Diagnosis Date  . GERD (gastroesophageal reflux disease)   . Hypertension     Patient Active Problem List   Diagnosis Date Noted  . TRIGGER FINGER, LEFT THUMB 08/12/2009  . ANXIETY 05/20/2009  . CANDIDIASIS, SKIN 03/12/2009  . TINGLING 12/24/2008  . PHARYNGITIS 08/31/2008  . UPPER RESPIRATORY INFECTION 08/31/2008  . CHEST PAIN 02/03/2008  .  HYPOKALEMIA 11/07/2007  . Other specified disorders of bladder 09/26/2007  . GASTRITIS 08/07/2007  . HEADACHE 08/07/2007  . CONSTIPATION 07/23/2007  . MICROSCOPIC HEMATURIA 07/23/2007  . DENTAL CARIES 05/27/2007  . TINEA PEDIS 05/23/2007  . ALLERGIC RHINITIS 05/23/2007  . GERD 05/23/2007  . HIATAL HERNIA 05/23/2007  . DEGENERATIVE JOINT DISEASE, LEFT KNEE 05/23/2007  . ESSENTIAL HYPERTENSION, BENIGN 05/20/2007    Past Surgical History:  Procedure Laterality Date  . TOE SURGERY    . TUBAL LIGATION      OB History    No data available       Home Medications    Prior to Admission medications   Medication Sig Start Date End Date Taking? Authorizing Provider  acetaminophen (TYLENOL) 500 MG tablet Take 1 tablet (500 mg total) by mouth every 6 (six) hours as needed. 07/07/14   Everlene Farrier, PA-C  dextromethorphan-guaiFENesin (MUCINEX DM) 30-600 MG per 12 hr tablet Take 1 tablet by mouth 2 (two) times daily. 07/07/14   Everlene Farrier, PA-C  escitalopram (LEXAPRO) 10 MG tablet Take 10 mg by mouth daily.    Historical Provider, MD  hydrOXYzine (ATARAX/VISTARIL) 25 MG tablet Take 1 tablet (25 mg total) by mouth every 6 (six) hours. Patient not taking: Reported on 07/07/2014 02/02/14   Dahlia Client Muthersbaugh, PA-C  LORazepam (ATIVAN) 1 MG tablet Take 1 tablet (1 mg total) by mouth 3 (three) times daily as needed for anxiety. Patient not taking: Reported on 07/07/2014 10/24/12   Gwyneth Sprout, MD  nystatin-triamcinolone (MYCOLOG II) cream Apply to affected area daily  Patient not taking: Reported on 07/07/2014 02/02/14   Dahlia Client Muthersbaugh, PA-C  omeprazole (PRILOSEC) 20 MG capsule Take 1 capsule (20 mg total) by mouth daily. 10/24/12   Roxy Horseman, PA-C  permethrin (ELIMITE) 5 % cream Apply to affected area once Patient not taking: Reported on 07/07/2014 02/02/14   Dahlia Client Muthersbaugh, PA-C  triamterene-hydrochlorothiazide (MAXZIDE-25) 37.5-25 MG per tablet Take 1 each (1 tablet  total) by mouth daily. 10/24/12   Roxy Horseman, PA-C    Family History No family history on file.  Social History Social History  Substance Use Topics  . Smoking status: Never Smoker  . Smokeless tobacco: Never Used  . Alcohol use No     Allergies   Aspirin and Ibuprofen   Review of Systems Review of Systems  Constitutional: Positive for chills. Negative for fever.  HENT: Positive for congestion and sore throat. Negative for drooling, ear discharge, ear pain, rhinorrhea and trouble swallowing.   Respiratory: Positive for cough. Negative for shortness of breath and wheezing.   Cardiovascular: Negative for chest pain.  Gastrointestinal: Negative for abdominal pain, constipation, diarrhea, nausea and vomiting.  Genitourinary: Negative for dysuria and hematuria.  Musculoskeletal: Negative for arthralgias, back pain and myalgias.  Skin: Negative for color change.  Allergic/Immunologic: Negative for immunocompromised state.  Neurological: Negative for weakness and numbness.  Psychiatric/Behavioral: Negative for confusion.   10 Systems reviewed and are negative for acute change except as noted in the HPI.   Physical Exam Updated Vital Signs BP 147/55   Pulse 69   Temp 97.8 F (36.6 C) (Oral)   Resp 18   Ht 4\' 11"  (1.499 m)   Wt 68 kg   SpO2 99%   BMI 30.30 kg/m   Physical Exam  Constitutional: She is oriented to person, place, and time. Vital signs are normal. She appears well-developed and well-nourished.  Non-toxic appearance. No distress.  Afebrile, nontoxic, NAD  HENT:  Head: Normocephalic and atraumatic.  Nose: Mucosal edema and rhinorrhea present.  Mouth/Throat: Uvula is midline and mucous membranes are normal. No trismus in the jaw. No uvula swelling. Posterior oropharyngeal erythema present. No oropharyngeal exudate, posterior oropharyngeal edema or tonsillar abscesses. Tonsils are 0 on the right. Tonsils are 0 on the left. No tonsillar exudate.  Nose with  mild mucosal edema and rhinorrhea. Oropharynx injected, without uvular swelling or deviation, no trismus or drooling, no tonsillar swelling, no exudates. No PTA   Eyes: Conjunctivae and EOM are normal. Right eye exhibits no discharge. Left eye exhibits no discharge.  Neck: Normal range of motion. Neck supple.  Cardiovascular: Normal rate, regular rhythm, normal heart sounds and intact distal pulses.  Exam reveals no gallop and no friction rub.   No murmur heard. Pulmonary/Chest: Effort normal and breath sounds normal. No respiratory distress. She has no decreased breath sounds. She has no wheezes. She has no rhonchi. She has no rales.  CTAB in all lung fields, no w/r/r, no hypoxia or increased WOB, speaking in full sentences, SpO2 99% on RA   Abdominal: Soft. Normal appearance and bowel sounds are normal. She exhibits no distension. There is no tenderness. There is no rigidity, no rebound, no guarding, no CVA tenderness, no tenderness at McBurney's point and negative Murphy's sign.  Musculoskeletal: Normal range of motion.  Neurological: She is alert and oriented to person, place, and time. She has normal strength. No sensory deficit.  Skin: Skin is warm, dry and intact. No rash noted.  Psychiatric: She has a normal mood and affect.  Nursing note and vitals reviewed.    ED Treatments / Results  Labs (all labs ordered are listed, but only abnormal results are displayed) Labs Reviewed - No data to display  EKG  EKG Interpretation None       Radiology No results found.  Procedures Procedures (including critical care time)  Medications Ordered in ED Medications - No data to display   Initial Impression / Assessment and Plan / ED Course  I have reviewed the triage vital signs and the nursing notes.  Pertinent labs & imaging results that were available during my care of the patient were reviewed by me and considered in my medical decision making (see chart for details).     61  y.o. female here with URI symptoms x4 days. Pt is afebrile with a clear lung exam. Mild rhinorrhea and congestion, mildly injected oropharynx but otherwise clear. Likely viral URI, doubt need for further emergent work up. Pt is agreeable to symptomatic treatment with close follow up with PCP as needed but spoke at length about emergent changing or worsening of symptoms that should prompt return to ER. Pt voices understanding and is agreeable to plan. Stable at time of discharge.   Final Clinical Impressions(s) / ED Diagnoses   Final diagnoses:  Viral URI with cough    New Prescriptions New Prescriptions   No medications on file     [address] Ridgewood DriveMercedes Nathian Stencil, PA-C 08/20/16 0800    Alvira MondayErin Schlossman, MD 08/21/16 [phone]

## 2016-08-20 NOTE — ED Triage Notes (Signed)
Pt c/o cold symptoms x 4 days. Pt tried Delsym over the counter and began to experience left sided back pain onset yesterday. Pt denies urinary symptoms or recent injury. Pt works in housekeeping.

## 2016-08-20 NOTE — Discharge Instructions (Signed)
Continue to stay well-hydrated. Gargle warm salt water and spit it out. Use chloraseptic spray as needed for sore throat. Continue to alternate between Tylenol and Ibuprofen for pain or fever. Use Mucinex for cough suppression/expectoration of mucus. Use netipot and flonase to help with nasal congestion. May consider over-the-counter Benadryl or other antihistamine to decrease secretions and for help with your symptoms. Followup with your primary care doctor in 5-7 days for recheck of ongoing symptoms. Return to emergency department for emergent changing or worsening of symptoms.  °

## 2016-09-12 ENCOUNTER — Encounter (HOSPITAL_COMMUNITY): Payer: Self-pay

## 2016-09-12 ENCOUNTER — Emergency Department (HOSPITAL_COMMUNITY)
Admission: EM | Admit: 2016-09-12 | Discharge: 2016-09-12 | Disposition: A | Discharge status: Home / Self Care | Payer: Medicaid Other | Attending: Emergency Medicine | Admitting: Emergency Medicine

## 2016-09-12 DIAGNOSIS — M25562 Pain in left knee: Secondary | ICD-10-CM | POA: Insufficient documentation

## 2016-09-12 DIAGNOSIS — I10 Essential (primary) hypertension: Secondary | ICD-10-CM | POA: Insufficient documentation

## 2016-09-12 DIAGNOSIS — Z79899 Other long term (current) drug therapy: Secondary | ICD-10-CM | POA: Insufficient documentation

## 2016-09-12 DIAGNOSIS — G8929 Other chronic pain: Secondary | ICD-10-CM | POA: Insufficient documentation

## 2016-09-12 MED ORDER — OXYCODONE-ACETAMINOPHEN 5-325 MG PO TABS
1.0000 | ORAL_TABLET | Freq: Once | ORAL | Status: AC
  Administered 2016-09-12: 1 via ORAL
  Filled 2016-09-12: qty 1

## 2016-09-12 MED ORDER — DICLOFENAC SODIUM 1 % TD GEL: 2.0000 g | Freq: Two times a day (BID) | TRANSDERMAL | Status: DC

## 2016-09-12 MED ORDER — DICLOFENAC SODIUM 1 % TD GEL: 2.0000 g | Freq: Four times a day (QID) | TRANSDERMAL | 0 refills | Status: DC

## 2016-09-12 NOTE — ED Triage Notes (Signed)
Per pt, Pt has hx of left knee pain with diagnosis of arthritis and no cartilage in the knee. Reports pain to the left knee after work and unable to see MD.

## 2016-09-12 NOTE — Discharge Instructions (Signed)
Please read and follow all provided instructions.  Your diagnoses today include:  1. Chronic pain of left knee     Tests performed today include: Vital signs. See below for your results today.   Medications prescribed:  Take as prescribed   Home care instructions:  Follow any educational materials contained in this packet.  Follow-up instructions: Please follow-up with your orthopedic provider for further evaluation of symptoms and treatment   Return instructions:  Please return to the Emergency Department if you do not get better, if you get worse, or new symptoms OR  - Fever (temperature greater than 101.87F)  - Bleeding that does not stop with holding pressure to the area    -Severe pain (please note that you may be more sore the day after your accident)  - Chest Pain  - Difficulty breathing  - Severe nausea or vomiting  - Inability to tolerate food and liquids  - Passing out  - Skin becoming red around your wounds  - Change in mental status (confusion or lethargy)  - New numbness or weakness    Please return if you have any other emergent concerns.  Additional Information:  Your vital signs today were: BP 151/71 (BP Location: Left Arm)    Pulse 66    Temp 97.6 F (36.4 C) (Oral)    Resp 18    Ht 4\' 11"  (1.499 m)    Wt 68 kg    SpO2 100%    BMI 30.30 kg/m  If your blood pressure (BP) was elevated above 135/85 this visit, please have this repeated by your doctor within one month. ---------------

## 2016-09-12 NOTE — ED Provider Notes (Signed)
[facility]PT Provider Note   CSN: [id number] Arrival date & time: 09/12/16  0845   By signing my name below, I, Clovis PuAvnee Patel, attest that this documentation has been prepared under the direction and in the presence of  Audry Piliyler Virlee Stroschein, PA-C. Electronically Signed: Clovis PuAvnee Patel, ED Scribe. 09/12/16. 9:29 AM.   History   Chief Complaint Chief Complaint  Patient presents with  . Knee Pain   The history is provided by the patient. No language interpreter was used.   HPI Comments:  [name] is a 61 y.o. female, with a hx of arthritis, who presents to the Emergency Department complaining of gradual onset, constant, moderate left knee pain with associated swelling onset several days. Her pain is worse after ambulation and better when wearing her knee brace. 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 She has been taking Mobic, last taken 1 week ago, and Tylenol with no significant relief. Pt has also been given cortisone shots with temporary relief. No fevers. No numbness/tingling. Pt denies any injury/trauma to her knee, a hx of surgery to her knee or any other associated symptoms.   Orthopedist: Dr. Althea CharonMckinley   Past Medical History:  Diagnosis Date  . GERD (gastroesophageal reflux disease)   . Hypertension     Patient Active Problem List   Diagnosis Date Noted  . TRIGGER FINGER, LEFT THUMB 08/12/2009  . ANXIETY 05/20/2009  . CANDIDIASIS, SKIN 03/12/2009  . TINGLING 12/24/2008  . PHARYNGITIS 08/31/2008  . UPPER RESPIRATORY INFECTION 08/31/2008  . CHEST PAIN 02/03/2008  . HYPOKALEMIA 11/07/2007  . Other specified disorders of bladder 09/26/2007  . GASTRITIS 08/07/2007  . HEADACHE 08/07/2007  . CONSTIPATION 07/23/2007  . MICROSCOPIC HEMATURIA 07/23/2007  . DENTAL CARIES 05/27/2007  . TINEA PEDIS 05/23/2007  . ALLERGIC RHINITIS 05/23/2007  . GERD 05/23/2007  . HIATAL HERNIA 05/23/2007  . DEGENERATIVE JOINT DISEASE, LEFT KNEE 05/23/2007  .  ESSENTIAL HYPERTENSION, BENIGN 05/20/2007    Past Surgical History:  Procedure Laterality Date  . TOE SURGERY    . TUBAL LIGATION      OB History    No data available       Home Medications    Prior to Admission medications   Medication Sig Start Date End Date Taking? Authorizing Provider  acetaminophen (TYLENOL) 500 MG tablet Take 1 tablet (500 mg total) by mouth every 6 (six) hours as needed. 07/07/14   Everlene FarrierWilliam Dansie, PA-C  dextromethorphan-guaiFENesin (MUCINEX DM) 30-600 MG per 12 hr tablet Take 1 tablet by mouth 2 (two) times daily. 07/07/14   Everlene FarrierWilliam Dansie, PA-C  escitalopram (LEXAPRO) 10 MG tablet Take 10 mg by mouth daily.    Historical Provider, MD  hydrOXYzine (ATARAX/VISTARIL) 25 MG tablet Take 1 tablet (25 mg total) by mouth every 6 (six) hours. Patient not taking: Reported on 07/07/2014 02/02/14   Dahlia ClientHannah Muthersbaugh, PA-C  LORazepam (ATIVAN) 1 MG tablet Take 1 tablet (1 mg total) by mouth 3 (three) times daily as needed for anxiety. Patient not taking: Reported on 07/07/2014 10/24/12   Gwyneth SproutWhitney Plunkett, MD  nystatin-triamcinolone [facility](MYCOLOG II) cream Apply to affected area daily Patient not taking: Reported on 07/07/2014 02/02/14   Dahlia ClientHannah Muthersbaugh, PA-C  omeprazole (PRILOSEC) 20 MG capsule Take 1 capsule (20 mg total) by mouth daily. 10/24/12   Roxy Horsemanobert Browning, PA-C  permethrin (ELIMITE) 5 % cream Apply to affected area once Patient not taking: Reported on 07/07/2014 02/02/14   Dahlia ClientHannah Muthersbaugh, PA-C  triamterene-hydrochlorothiazide (MAXZIDE-25) 37.5-25 MG  per tablet Take 1 each (1 tablet total) by mouth daily. 10/24/12   Roxy Horseman, PA-C    Family History No family history on file.  Social History Social History  Substance Use Topics  . Smoking status: Never Smoker  . Smokeless tobacco: Never Used  . Alcohol use No     Allergies   Aspirin and Ibuprofen   Review of Systems Review of Systems  Musculoskeletal: Positive for joint swelling and  myalgias.  Neurological: Negative for numbness.   Physical Exam Updated Vital Signs BP 151/71 (BP Location: Left Arm)   Pulse 66   Temp 97.6 F (36.4 C) (Oral)   Resp 18   Ht 4\' 11"  (1.499 m)   Wt 150 lb (68 kg)   SpO2 100%   BMI 30.30 kg/m   Physical Exam  Constitutional: She is oriented to person, place, and time. Vital signs are normal. She appears well-developed and well-nourished. No distress.  HENT:  Head: Normocephalic and atraumatic.  Right Ear: Hearing normal.  Left Ear: Hearing normal.  Eyes: Conjunctivae and EOM are normal. Pupils are equal, round, and reactive to light.  Cardiovascular: Normal rate and regular rhythm.   Pulmonary/Chest: Effort normal.  Abdominal: She exhibits no distension.  Musculoskeletal:  Left Knee: Negative anterior/poster drawer bilaterally. Negative ballottement test. No varus or valgus laxity. Moderate crepitus. No pain with flexion or extension. No TTP of knees or ankles. No swelling. No erythema. No signs of infection   Neurological: She is alert and oriented to person, place, and time.  Skin: Skin is warm and dry.  Psychiatric: She has a normal mood and affect. Her speech is normal and behavior is normal. Thought content normal.  Nursing note and vitals reviewed.  ED Treatments / Results  DIAGNOSTIC STUDIES:  Oxygen Saturation is 100% on RA, normal by my interpretation.    COORDINATION OF CARE:  9:23 AM Discussed treatment plan with pt at bedside and pt agreed to plan.  Labs (all labs ordered are listed, but only abnormal results are displayed) Labs Reviewed - No data to display  EKG  EKG Interpretation None       Radiology No results found.  Procedures Procedures (including critical care time)  Medications Ordered in ED Medications - No data to display   Initial Impression / Assessment and Plan / ED Course  I have reviewed the triage vital signs and the nursing notes.  Pertinent labs & imaging results that  were available during my care of the patient were reviewed by me and considered in my medical decision making (see chart for details).     {I have reviewed the relevant previous healthcare records.  {I obtained HPI from historian.   ED Course:  Assessment: Patient does not meet [place] knee rules for acute knee injury and therefore no x-ray ordered. (Age > 55, no tenderness at head of fibula, no isolated patella tenderness, no inability to flex knee past 90 degrees, or inability to bear weight for more than 4 steps immediately after injury or in ED). Doubt infectious etiology. No erythema. No fevers. Likely arthritis. Counseled on proper treatment and close follow up to Ortho. Strict return precautions given. Plan is to DC home.   Disposition/Plan:  DC Home Additional Verbal discharge instructions given and discussed with patient.  Pt Instructed to f/u with Ortho in the next week for evaluation and treatment of symptoms. Return precautions given Pt acknowledges and agrees with plan  Supervising Physician Gerhard Munch, MD  Final Clinical  Impressions(s) / ED Diagnoses   Final diagnoses:  Chronic pain of left knee    New Prescriptions New Prescriptions   No medications on file   I personally performed the services described in this documentation, which was scribed in my presence. The recorded information has been reviewed and is accurate.     Audry Pili, PA-C 09/12/16 1116    Gerhard Munch, MD 09/12/16 [phone]

## 2016-11-16 ENCOUNTER — Emergency Department (HOSPITAL_COMMUNITY)
Admission: EM | Admit: 2016-11-16 | Discharge: 2016-11-17 | Disposition: A | Discharge status: Home / Self Care | Payer: Self-pay | Attending: Emergency Medicine | Admitting: Emergency Medicine

## 2016-11-16 ENCOUNTER — Encounter (HOSPITAL_COMMUNITY): Payer: Self-pay | Admitting: Nurse Practitioner

## 2016-11-16 ENCOUNTER — Emergency Department (HOSPITAL_COMMUNITY): Payer: Self-pay

## 2016-11-16 DIAGNOSIS — R197 Diarrhea, unspecified: Secondary | ICD-10-CM | POA: Insufficient documentation

## 2016-11-16 DIAGNOSIS — M25562 Pain in left knee: Secondary | ICD-10-CM | POA: Insufficient documentation

## 2016-11-16 DIAGNOSIS — R112 Nausea with vomiting, unspecified: Secondary | ICD-10-CM | POA: Insufficient documentation

## 2016-11-16 DIAGNOSIS — Z79899 Other long term (current) drug therapy: Secondary | ICD-10-CM | POA: Insufficient documentation

## 2016-11-16 DIAGNOSIS — I10 Essential (primary) hypertension: Secondary | ICD-10-CM | POA: Insufficient documentation

## 2016-11-16 LAB — URINALYSIS, ROUTINE W REFLEX MICROSCOPIC
BILIRUBIN URINE: NEGATIVE
Bacteria, UA: NONE SEEN
GLUCOSE, UA: NEGATIVE mg/dL
Ketones, ur: NEGATIVE mg/dL
NITRITE: NEGATIVE
PH: 5 (ref 5.0–8.0)
Protein, ur: NEGATIVE mg/dL
SPECIFIC GRAVITY, URINE: 1.027 (ref 1.005–1.030)

## 2016-11-16 LAB — COMPREHENSIVE METABOLIC PANEL
ALT: 14 U/L (ref 14–54)
AST: 26 U/L (ref 15–41)
Albumin: 4 g/dL (ref 3.5–5.0)
Alkaline Phosphatase: 69 U/L (ref 38–126)
Anion gap: 9 (ref 5–15)
BUN: 16 mg/dL (ref 6–20)
CALCIUM: 9.6 mg/dL (ref 8.9–10.3)
CHLORIDE: 103 mmol/L (ref 101–111)
CO2: 27 mmol/L (ref 22–32)
CREATININE: 0.84 mg/dL (ref 0.44–1.00)
Glucose, Bld: 106 mg/dL — ABNORMAL HIGH (ref 65–99)
Potassium: 3.6 mmol/L (ref 3.5–5.1)
Sodium: 139 mmol/L (ref 135–145)
TOTAL PROTEIN: 7.4 g/dL (ref 6.5–8.1)
Total Bilirubin: 0.9 mg/dL (ref 0.3–1.2)

## 2016-11-16 LAB — CBC
HCT: 42.7 % (ref 36.0–46.0)
Hemoglobin: 14.2 g/dL (ref 12.0–15.0)
MCH: 30.2 pg (ref 26.0–34.0)
MCHC: 33.3 g/dL (ref 30.0–36.0)
MCV: 90.9 fL (ref 78.0–100.0)
PLATELETS: 280 10*3/uL (ref 150–400)
RBC: 4.7 MIL/uL (ref 3.87–5.11)
RDW: 12.1 % (ref 11.5–15.5)
WBC: 7.3 10*3/uL (ref 4.0–10.5)

## 2016-11-16 MED ORDER — HYDROCODONE-ACETAMINOPHEN 5-325 MG PO TABS
1.0000 | ORAL_TABLET | Freq: Once | ORAL | Status: AC
  Administered 2016-11-16: 1 via ORAL
  Filled 2016-11-16: qty 1

## 2016-11-16 NOTE — ED Notes (Signed)
Patient transported to X-ray 

## 2016-11-16 NOTE — ED Triage Notes (Signed)
Pt presents with c/o bilateral knee pain, GI upset. Shes had bilateral knee pain, worse in the L knee, for the past week. She denies any injury. shes tried tylenol with no relief. She also c/o nausea and abdominal pain that began 2 days ago. Symptoms became worse last night, she woke form her sleep vomiting. She reports diarrheal. She denies fevers, urinary complaints.

## 2016-11-17 MED ORDER — NAPROXEN 500 MG PO TABS: 500.0000 mg | ORAL_TABLET | Freq: Two times a day (BID) | ORAL | 0 refills | Status: DC

## 2016-11-17 MED ORDER — ONDANSETRON 4 MG PO TBDP: 4.0000 mg | ORAL_TABLET | Freq: Three times a day (TID) | ORAL | 0 refills | Status: DC | PRN

## 2016-11-17 NOTE — Discharge Instructions (Signed)
Please read and follow all provided instructions.  Your diagnoses today include:  1. Acute pain of left knee   2. Nausea vomiting and diarrhea    Tests performed today include:  Blood counts and electrolytes  Blood tests to check liver and kidney function  Urine test to look for infection and pregnancy (in women)  X-ray of knee - shows osteoarthritis  Vital signs. See below for your results today.   Medications prescribed:   Zofran (ondansetron) - for nausea and vomiting   Ibuprofen (Motrin, Advil) - anti-inflammatory pain medication  Do not exceed 600mg  ibuprofen every 6 hours, take with food  You have been prescribed an anti-inflammatory medication or NSAID. Take with food. Take smallest effective dose for the shortest duration needed for your pain. Stop taking if you experience stomach pain or vomiting.   Take any prescribed medications only as directed.  Home care instructions:   Follow any educational materials contained in this packet.   Your abdominal pain, nausea, vomiting, and diarrhea may be caused by a viral gastroenteritis also called 'stomach flu'. You should rest for the next several days. Keep drinking plenty of fluids and use the medicine for nausea as directed.    Drink clear liquids for the next 24 hours and introduce solid foods slowly after 24 hours using the b.r.a.t. diet (Bananas, Rice, Applesauce, Toast, Yogurt).    Follow-up instructions: Please follow-up with your primary care provider in the next 2 days for further evaluation of your symptoms. If you are not feeling better in 48 hours you may have a condition that is more serious and you need re-evaluation.   Return instructions:  SEEK IMMEDIATE MEDICAL ATTENTION IF:  If you have pain that does not go away or becomes severe   A temperature above 101F develops   Repeated vomiting occurs (multiple episodes)   If you have pain that becomes localized to portions of the abdomen. The right side  could possibly be appendicitis. In an adult, the left lower portion of the abdomen could be colitis or diverticulitis.   Blood is being passed in stools or vomit (bright red or black tarry stools)   You develop chest pain, difficulty breathing, dizziness or fainting, or become confused, poorly responsive, or inconsolable (young children)  If you have any other emergent concerns regarding your health  Additional Information: Abdominal (belly) pain can be caused by many things. Your caregiver performed an examination and possibly ordered blood/urine tests and imaging (CT scan, x-rays, ultrasound). Many cases can be observed and treated at home after initial evaluation in the emergency department. Even though you are being discharged home, abdominal pain can be unpredictable. Therefore, you need a repeated exam if your pain does not resolve, returns, or worsens. Most patients with abdominal pain don't have to be admitted to the hospital or have surgery, but serious problems like appendicitis and gallbladder attacks can start out as nonspecific pain. Many abdominal conditions cannot be diagnosed in one visit, so follow-up evaluations are very important.  Your vital signs today were: BP 139/64 (BP Location: Left Arm)    Pulse 68    Temp 98.4 F (36.9 C) (Oral)    Resp 14    SpO2 97%  If your blood pressure (bp) was elevated above 135/85 this visit, please have this repeated by your doctor within one month. --------------

## 2016-11-17 NOTE — ED Provider Notes (Signed)
[facility]-EMERGENCY DEPT Provider Note   CSN: [id number] Arrival date & time: 11/16/16  1551     History   Chief Complaint Chief Complaint  Patient presents with  . Knee Pain  . GI Problem    HPI [name] is a 61 y.o. female.  Patient with history of osteoarthritis presents with complaint of bilateral knee pain, left greater than right for several days. She has had similar symptoms in the past. She has tried Aleve without improvement. She is able to walk. Pain is worse with movement. No significant swelling. No history of gout.  She also has had nausea, vomiting, diarrhea for the past 2 days. No focal abdominal pain. Emesis was nonbloody, nonbilious. Diarrhea was nonbloody. Both of them resolve the patient continues to have nausea. Denies known sick contacts. No history of abdominal surgeries other than tubal ligation. No fevers, CP, SOB, urinary symptoms including hematuria, dysuria.       Past Medical History:  Diagnosis Date  . GERD (gastroesophageal reflux disease)   . Hypertension     Patient Active Problem List   Diagnosis Date Noted  . TRIGGER FINGER, LEFT THUMB 08/12/2009  . ANXIETY 05/20/2009  . CANDIDIASIS, SKIN 03/12/2009  . TINGLING 12/24/2008  . PHARYNGITIS 08/31/2008  . UPPER RESPIRATORY INFECTION 08/31/2008  . CHEST PAIN 02/03/2008  . HYPOKALEMIA 11/07/2007  . Other specified disorders of bladder 09/26/2007  . GASTRITIS 08/07/2007  . HEADACHE 08/07/2007  . CONSTIPATION 07/23/2007  . MICROSCOPIC HEMATURIA 07/23/2007  . DENTAL CARIES 05/27/2007  . TINEA PEDIS 05/23/2007  . ALLERGIC RHINITIS 05/23/2007  . GERD 05/23/2007  . HIATAL HERNIA 05/23/2007  . DEGENERATIVE JOINT DISEASE, LEFT KNEE 05/23/2007  . ESSENTIAL HYPERTENSION, BENIGN 05/20/2007    Past Surgical History:  Procedure Laterality Date  . TOE SURGERY    . TUBAL LIGATION      OB History    No data available       Home Medications    Prior to Admission medications   Medication  Sig Start Date End Date Taking? Authorizing Provider  acetaminophen (TYLENOL) 500 MG tablet Take 1 tablet (500 mg total) by mouth every 6 (six) hours as needed. 07/07/14   Everlene Farrier, PA-C  dextromethorphan-guaiFENesin (MUCINEX DM) 30-600 MG per 12 hr tablet Take 1 tablet by mouth 2 (two) times daily. 07/07/14   Everlene Farrier, PA-C  diclofenac sodium (VOLTAREN) 1 % GEL Apply 2 g topically 4 (four) times daily. 09/12/16   Audry Pili, PA-C  escitalopram (LEXAPRO) 10 MG tablet Take 10 mg by mouth daily.    [provider]  naproxen (NAPROSYN) 500 MG tablet Take 1 tablet (500 mg total) by mouth 2 (two) times daily. 11/17/16   Renne Crigler, PA-C  omeprazole (PRILOSEC) 20 MG capsule Take 1 capsule (20 mg total) by mouth daily. 10/24/12   Roxy Horseman, PA-C  ondansetron (ZOFRAN ODT) 4 MG disintegrating tablet Take 1 tablet (4 mg total) by mouth every 8 (eight) hours as needed for nausea or vomiting. 11/17/16   Renne Crigler, PA-C  triamterene-hydrochlorothiazide (MAXZIDE-25) 37.5-25 MG per tablet Take 1 each (1 tablet total) by mouth daily. 10/24/12   Roxy Horseman, PA-C    Family History History reviewed. No pertinent family history.  Social History Social History  Substance Use Topics  . Smoking status: Never Smoker  . Smokeless tobacco: Never Used  . Alcohol use No     Allergies   Aspirin and Ibuprofen   Review of Systems Review of Systems  Constitutional: Negative  for activity change, appetite change and fever.  HENT: Negative for rhinorrhea and sore throat.   Eyes: Negative for redness.  Respiratory: Negative for cough.   Cardiovascular: Negative for chest pain.  Gastrointestinal: Positive for diarrhea, nausea and vomiting. Negative for abdominal pain and blood in stool.       Negative for hematemesis  Genitourinary: Negative for dysuria.  Musculoskeletal: Positive for arthralgias. Negative for back pain, joint swelling, myalgias and neck pain.  Skin: Negative  for rash and wound.  Neurological: Negative for weakness, light-headedness and numbness.     Physical Exam Updated Vital Signs BP 139/64 (BP Location: Left Arm)   Pulse 68   Temp 98.4 F (36.9 C) (Oral)   Resp 14   SpO2 97%   Physical Exam  Constitutional: She appears well-developed and well-nourished.  HENT:  Head: Normocephalic and atraumatic.  Eyes: Conjunctivae are normal. Pupils are equal, round, and reactive to light. Right eye exhibits no discharge. Left eye exhibits no discharge.  Neck: Normal range of motion. Neck supple.  Cardiovascular: Normal rate, regular rhythm, normal heart sounds and normal pulses.  Exam reveals no decreased pulses.   Pulmonary/Chest: Effort normal and breath sounds normal.  Abdominal: Soft. There is no tenderness. There is no rebound and no guarding.  Musculoskeletal: She exhibits tenderness. She exhibits no edema.  Neurological: She is alert. No sensory deficit.  Motor, sensation, and vascular distal to the injury is fully intact.   Skin: Skin is warm and dry.  Psychiatric: She has a normal mood and affect.  Nursing note and vitals reviewed.    ED Treatments / Results  Labs (all labs ordered are listed, but only abnormal results are displayed) Labs Reviewed  COMPREHENSIVE METABOLIC PANEL - Abnormal; Notable for the following:       Result Value   Glucose, Bld 106 (*)    All other components within normal limits  URINALYSIS, ROUTINE W REFLEX MICROSCOPIC - Abnormal; Notable for the following:    Hgb urine dipstick MODERATE (*)    Leukocytes, UA MODERATE (*)    Squamous Epithelial / LPF 0-5 (*)    All other components within normal limits  CBC    Radiology Dg Knee Complete 4 Views Left  Result Date: 11/16/2016 CLINICAL DATA:  Acute onset of bilateral knee pain, worse on the left. Initial encounter. EXAM: LEFT KNEE - COMPLETE 4+ VIEW COMPARISON:  Left knee MRI performed 07/16/2006, and left knee radiographs performed 04/23/2006  FINDINGS: There is no evidence of fracture or dislocation. The joint spaces are preserved. Marginal osteophyte formation is noted at the medial and lateral compartments. No significant joint effusion is seen. The visualized soft tissues are normal in appearance. IMPRESSION: 1. No evidence of fracture or dislocation. 2. Mild osteoarthritis at the medial and lateral compartments. Electronically Signed   By: Roanna Raider M.D.   On: 11/16/2016 22:19    Procedures Procedures (including critical care time)  Medications Ordered in ED Medications  HYDROcodone-acetaminophen (NORCO/VICODIN) 5-325 MG per tablet 1 tablet (1 tablet Oral Given 11/16/16 2205)     Initial Impression / Assessment and Plan / ED Course  I have reviewed the triage vital signs and the nursing notes.  Pertinent labs & imaging results that were available during my care of the patient were reviewed by me and considered in my medical decision making (see chart for details).     Patient seen and examined. Work-up initiated. Medications ordered.   Vital signs reviewed and are as follows: BP Marland Kitchen)  146/77 (BP Location: Left Arm)   Pulse 66   Temp 98.4 F (36.9 C) (Oral)   Resp 18   SpO2 97%   12:26 AM patient provided with knee sleeve. Updated on x-ray results. Will discharge to home with Zofran and naproxen. Patient may also take Tylenol. She is allergic to ibuprofen. She states she cannot afford prescription NSAIDs.  Final Clinical Impressions(s) / ED Diagnoses   Final diagnoses:  Acute pain of left knee  Nausea vomiting and diarrhea   Knee pain: Chronic, likely due to osteoarthritis. Exam does not show significant effusion. Patient is able to flex both knees however pain is worse in left than right. Low suspicion for septic arthritis. No fever or other systemic symptoms of illness.  N/V/D:  Vitals are stable, no fever.  No signs of dehydration, tolerating PO's. Labs are reassuring. Lungs are clear. No focal abdominal  pain. Low concern for appendicitis, cholecystitis, pancreatitis, ruptured viscus, UTI, kidney stone, aortic dissection, aortic aneurysm or other emergent abdominal etiology. Supportive therapy indicated with return if symptoms worsen. Patient counseled.   New Prescriptions Current Discharge Medication List    START taking these medications   Details  naproxen (NAPROSYN) 500 MG tablet Take 1 tablet (500 mg total) by mouth 2 (two) times daily. Qty: 30 tablet, Refills: 0    ondansetron (ZOFRAN ODT) 4 MG disintegrating tablet Take 1 tablet (4 mg total) by mouth every 8 (eight) hours as needed for nausea or vomiting. Qty: 10 tablet, Refills: 0         [name], Amylah Will, PA-C 11/17/16 [id number]    Mancel BaleWentz, Elliott, MD 11/20/16 0700

## 2016-12-12 ENCOUNTER — Emergency Department (HOSPITAL_COMMUNITY)
Admission: EM | Admit: 2016-12-12 | Discharge: 2016-12-12 | Disposition: A | Discharge status: Home / Self Care | Payer: Medicaid Other | Attending: Emergency Medicine | Admitting: Emergency Medicine

## 2016-12-12 ENCOUNTER — Encounter (HOSPITAL_COMMUNITY): Payer: Self-pay | Admitting: *Deleted

## 2016-12-12 DIAGNOSIS — Z79899 Other long term (current) drug therapy: Secondary | ICD-10-CM | POA: Insufficient documentation

## 2016-12-12 DIAGNOSIS — I10 Essential (primary) hypertension: Secondary | ICD-10-CM | POA: Insufficient documentation

## 2016-12-12 DIAGNOSIS — B001 Herpesviral vesicular dermatitis: Secondary | ICD-10-CM

## 2016-12-12 DIAGNOSIS — K130 Diseases of lips: Secondary | ICD-10-CM | POA: Insufficient documentation

## 2016-12-12 MED ORDER — SULFAMETHOXAZOLE-TRIMETHOPRIM 800-160 MG PO TABS
1.0000 | ORAL_TABLET | Freq: Once | ORAL | Status: AC
  Administered 2016-12-12: 1 via ORAL
  Filled 2016-12-12: qty 1

## 2016-12-12 MED ORDER — ACYCLOVIR 400 MG PO TABS: 400.0000 mg | ORAL_TABLET | Freq: Three times a day (TID) | ORAL | 0 refills | Status: AC

## 2016-12-12 MED ORDER — SULFAMETHOXAZOLE-TRIMETHOPRIM 800-160 MG PO TABS: 1.0000 | ORAL_TABLET | Freq: Two times a day (BID) | ORAL | 0 refills | Status: AC

## 2016-12-12 MED ORDER — HYDROCODONE-ACETAMINOPHEN 5-325 MG PO TABS: 1.0000 | ORAL_TABLET | Freq: Four times a day (QID) | ORAL | 0 refills | Status: DC | PRN

## 2016-12-12 MED ORDER — MUPIROCIN CALCIUM 2 % EX CREA
TOPICAL_CREAM | Freq: Two times a day (BID) | CUTANEOUS | Status: DC
  Administered 2016-12-12 (×2): via TOPICAL
  Filled 2016-12-12: qty 15

## 2016-12-12 MED ORDER — HYDROCODONE-ACETAMINOPHEN 5-325 MG PO TABS
2.0000 | ORAL_TABLET | Freq: Once | ORAL | Status: AC
  Administered 2016-12-12: 2 via ORAL
  Filled 2016-12-12: qty 2

## 2016-12-12 NOTE — ED Triage Notes (Signed)
Pt states that she has had upper lip swelling for 1 week. Pt noted to have a wound to the lip as well. Pt states that she has been wearing a mask at work and it has pulled the skin off.

## 2016-12-12 NOTE — ED Provider Notes (Signed)
[facility] DEPT Provider Note   CSN: [id number] Arrival date & time: 12/12/16  0820     History   Chief Complaint Chief Complaint  Patient presents with  . Oral Swelling    HPI [name] is a 61 y.o. female.  HPI  61 year old female with past medical history as below here with lip swelling. The patient states that for the last week, she has had mild cough, nasal congestion, and sore throat. She broke out in a "fever blister" along her left upper lip. She reports clear vesicles that were painful to the touch. Over the last 48 hours, her lesions have ruptured and she has had worsening lip pain and swelling. She endorses severe, sharp, stabbing pain in her left lip that is worse with movement and talking. Denies any drainage. Denies any facial swelling. Denies any fevers. No tongue swelling. No difficulty swallowing. She has a history of similar episodes in the past and has previously been on acyclovir for this.   Past Medical History:  Diagnosis Date  . GERD (gastroesophageal reflux disease)   . Hypertension     Patient Active Problem List   Diagnosis Date Noted  . TRIGGER FINGER, LEFT THUMB 08/12/2009  . ANXIETY 05/20/2009  . CANDIDIASIS, SKIN 03/12/2009  . TINGLING 12/24/2008  . PHARYNGITIS 08/31/2008  . UPPER RESPIRATORY INFECTION 08/31/2008  . CHEST PAIN 02/03/2008  . HYPOKALEMIA 11/07/2007  . Other specified disorders of bladder 09/26/2007  . GASTRITIS 08/07/2007  . HEADACHE 08/07/2007  . CONSTIPATION 07/23/2007  . MICROSCOPIC HEMATURIA 07/23/2007  . DENTAL CARIES 05/27/2007  . TINEA PEDIS 05/23/2007  . ALLERGIC RHINITIS 05/23/2007  . GERD 05/23/2007  . HIATAL HERNIA 05/23/2007  . DEGENERATIVE JOINT DISEASE, LEFT KNEE 05/23/2007  . ESSENTIAL HYPERTENSION, BENIGN 05/20/2007    Past Surgical History:  Procedure Laterality Date  . TOE SURGERY    . TUBAL LIGATION      OB History    No data available       Home Medications    Prior to Admission  medications   Medication Sig Start Date End Date Taking? Authorizing Provider  acetaminophen (TYLENOL) 500 MG tablet Take 1 tablet (500 mg total) by mouth every 6 (six) hours as needed. 07/07/14   [name], [name], PA-C  acyclovir (ZOVIRAX) 400 MG tablet Take 1 tablet (400 mg total) by mouth 3 (three) times daily. 12/12/16 12/19/16  [name], Rustin Erhart, MD  dextromethorphan-guaiFENesin [facility](MUCINEX DM) 30-600 MG per 12 hr tablet Take 1 tablet by mouth 2 (two) times daily. 07/07/14   [name], [name], PA-C  diclofenac sodium (VOLTAREN) 1 % GEL Apply 2 g topically 4 (four) times daily. 09/12/16   [name], [name], PA-C  escitalopram (LEXAPRO) 10 MG tablet Take 10 mg by mouth daily.    [provider]  HYDROcodone-acetaminophen (NORCO/VICODIN) 5-325 MG tablet Take 1-2 tablets by mouth every 6 (six) hours as needed for moderate pain or severe pain. 12/12/16   [name], Yamato Kopf, MD  naproxen (NAPROSYN) 500 MG tablet Take 1 tablet (500 mg total) by mouth 2 (two) times daily. 11/17/16   [name], [name], PA-C  omeprazole (PRILOSEC) 20 MG capsule Take 1 capsule (20 mg total) by mouth daily. 10/24/12   [name], [name], PA-C  ondansetron (ZOFRAN ODT) 4 MG disintegrating tablet Take 1 tablet (4 mg total) by mouth every 8 (eight) hours as needed for nausea or vomiting. 11/17/16   [name], [name], PA-C  sulfamethoxazole-trimethoprim (BACTRIM DS,SEPTRA DS) 800-160 MG tablet Take 1 tablet by mouth 2 (two) times daily. 12/12/16  12/19/16  Shaune Pollack, MD  triamterene-hydrochlorothiazide (MAXZIDE-25) 37.5-25 MG per tablet Take 1 each (1 tablet total) by mouth daily. 10/24/12   Roxy Horseman, PA-C    Family History No family history on file.  Social History Social History  Substance Use Topics  . Smoking status: Never Smoker  . Smokeless tobacco: Never Used  . Alcohol use No     Allergies   Aspirin and Ibuprofen   Review of Systems Review of Systems  Constitutional: Negative for fever.  HENT: Positive for congestion,  facial swelling, mouth sores (Lip) and sore throat. Negative for postnasal drip, sinus pain, trouble swallowing and voice change.      Physical Exam Updated Vital Signs BP (!) 164/103 (BP Location: Right Arm)   Pulse 73   Temp 98 F (36.7 C) (Oral)   Resp 16   SpO2 100%   Physical Exam  Constitutional: She is oriented to person, place, and time. She appears well-developed and well-nourished. No distress.  HENT:  Head: Normocephalic and atraumatic.  Moderate swelling to mid and left upper lip with associated skin excoriations. There are several small unroofed vesicles. There is moderate skin induration but no fluctuance. No drainage. No extension to the maxillary cheek or nose. No sublingual swelling and oropharynx is widely patent and clear.  Eyes: Conjunctivae are normal.  Neck: Neck supple.  Cardiovascular: Normal rate, regular rhythm and normal heart sounds.  Exam reveals no friction rub.   No murmur heard. Pulmonary/Chest: Effort normal and breath sounds normal. No respiratory distress. She has no wheezes. She has no rales.  Abdominal: She exhibits no distension.  Musculoskeletal: She exhibits no edema.  Neurological: She is alert and oriented to person, place, and time. She exhibits normal muscle tone.  Skin: Skin is warm. Capillary refill takes less than 2 seconds.  Psychiatric: She has a normal mood and affect.  Nursing note and vitals reviewed.    ED Treatments / Results  Labs (all labs ordered are listed, but only abnormal results are displayed) Labs Reviewed - No data to display  EKG  EKG Interpretation None       Radiology No results found.  Procedures Procedures (including critical care time)  Medications Ordered in ED Medications  mupirocin cream (BACTROBAN) 2 % (not administered)  HYDROcodone-acetaminophen (NORCO/VICODIN) 5-325 MG per tablet 2 tablet (2 tablets Oral Given 12/12/16 0900)  sulfamethoxazole-trimethoprim (BACTRIM DS,SEPTRA DS) 800-160 MG  per tablet 1 tablet (1 tablet Oral Given 12/12/16 0900)     Initial Impression / Assessment and Plan / ED Course  I have reviewed the triage vital signs and the nursing notes.  Pertinent labs & imaging results that were available during my care of the patient were reviewed by me and considered in my medical decision making (see chart for details).    61 year old female with past medical history as above. Left upper lip swelling. 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 Patient has no insurance and will prescribe acyclovir as well as Bactrim. She has no evidence of associated abscess. No evidence of oral involvement or Ludwig's angina. No HA, neck stiffness, or s/s meningitis or encephalitis. Patient is otherwise afebrile, not immune suppressed, and hemodynamically stable. Given mupirocin here and will continue at home.  This note was prepared with assistance of Conservation officer, historic buildings. Occasional wrong-word or sound-a-like substitutions may have occurred due to the inherent limitations of voice recognition software.   Final Clinical Impressions(s) / ED Diagnoses  Final diagnoses:  Cellulitis of lip  Herpes labialis    New Prescriptions New Prescriptions   ACYCLOVIR (ZOVIRAX) 400 MG TABLET    Take 1 tablet (400 mg total) by mouth 3 (three) times daily.   HYDROCODONE-ACETAMINOPHEN (NORCO/VICODIN) 5-325 MG TABLET    Take 1-2 tablets by mouth every 6 (six) hours as needed for moderate pain or severe pain.   SULFAMETHOXAZOLE-TRIMETHOPRIM (BACTRIM DS,SEPTRA DS) 800-160 MG TABLET    Take 1 tablet by mouth 2 (two) times daily.     Shaune Pollack, MD 12/12/16 [phone]

## 2016-12-12 NOTE — ED Notes (Signed)
States she started with a cough and cold onset 1 week ago states she could feel a fever blister coming up on her upper lip. States her upper lip is actually going down c/o lip being very sore.

## 2016-12-27 ENCOUNTER — Ambulatory Visit: Payer: Self-pay

## 2017-01-03 ENCOUNTER — Emergency Department (HOSPITAL_COMMUNITY)
Admission: EM | Admit: 2017-01-03 | Discharge: 2017-01-03 | Disposition: A | Discharge status: Home / Self Care | Payer: Self-pay | Attending: Emergency Medicine | Admitting: Emergency Medicine

## 2017-01-03 ENCOUNTER — Emergency Department (HOSPITAL_COMMUNITY): Payer: Self-pay

## 2017-01-03 ENCOUNTER — Encounter (HOSPITAL_COMMUNITY): Payer: Self-pay | Admitting: Emergency Medicine

## 2017-01-03 DIAGNOSIS — Z7982 Long term (current) use of aspirin: Secondary | ICD-10-CM | POA: Insufficient documentation

## 2017-01-03 DIAGNOSIS — G8929 Other chronic pain: Secondary | ICD-10-CM

## 2017-01-03 DIAGNOSIS — Z79899 Other long term (current) drug therapy: Secondary | ICD-10-CM | POA: Insufficient documentation

## 2017-01-03 DIAGNOSIS — M25562 Pain in left knee: Secondary | ICD-10-CM | POA: Insufficient documentation

## 2017-01-03 DIAGNOSIS — F419 Anxiety disorder, unspecified: Secondary | ICD-10-CM | POA: Insufficient documentation

## 2017-01-03 DIAGNOSIS — I10 Essential (primary) hypertension: Secondary | ICD-10-CM | POA: Insufficient documentation

## 2017-01-03 MED ORDER — NAPROXEN 375 MG PO TABS: 375.0000 mg | ORAL_TABLET | Freq: Two times a day (BID) | ORAL | 0 refills | Status: DC

## 2017-01-03 MED ORDER — ACETAMINOPHEN-CODEINE #3 300-30 MG PO TABS
1.0000 | ORAL_TABLET | Freq: Once | ORAL | Status: AC
  Administered 2017-01-03: 1 via ORAL
  Filled 2017-01-03: qty 1

## 2017-01-03 NOTE — ED Provider Notes (Signed)
[facility]-EMERGENCY DEPT Provider Note   CSN: [id number] Arrival date & time: 01/03/17  [id number]  By signing my name below, I, [name], attest that this documentation has been prepared under the direction and in the presence of Demetrios LollKenneth Suzanna Zahn, PA-C. Electronically Signed: Cynda AcresHailei Valdez, Scribe. 01/03/17. 9:56 AM.  History   Chief Complaint Chief Complaint  Patient presents with  . Knee Pain    HPI Comments: [name] is a 61 y.o. female with a history of hypertension and arthritis, who presents to the Emergency Department complaining of chronic left knee pain, which acutely worsened three days ago. Patient reports progressively worsening several days ago. Patient reports being seen by an orthopedist three months ago, she was diagnosed with arthritis. Patient states he was advising the patient to receive a silicon injection. Patient denies any numbness or weakness. No additional symptoms noted. Patient reports taking tylenol with mild relief. Patient has a knee brace applied upon arrival. Patient states her pain is worse with changing positions, nothing improves her pain. Patient is ambulatory in the emergency department. Patient denies any fever, chills, or any additional symptoms.   The history is provided by the patient. No language interpreter was used.    Past Medical History:  Diagnosis Date  . GERD (gastroesophageal reflux disease)   . Hypertension     Patient Active Problem List   Diagnosis Date Noted  . TRIGGER FINGER, LEFT THUMB 08/12/2009  . ANXIETY 05/20/2009  . CANDIDIASIS, SKIN 03/12/2009  . TINGLING 12/24/2008  . PHARYNGITIS 08/31/2008  . UPPER RESPIRATORY INFECTION 08/31/2008  . CHEST PAIN 02/03/2008  . HYPOKALEMIA 11/07/2007  . Other specified disorders of bladder 09/26/2007  . GASTRITIS 08/07/2007  . HEADACHE 08/07/2007  . CONSTIPATION 07/23/2007  . MICROSCOPIC HEMATURIA 07/23/2007  . DENTAL CARIES 05/27/2007  . TINEA PEDIS 05/23/2007  . ALLERGIC RHINITIS  05/23/2007  . GERD 05/23/2007  . HIATAL HERNIA 05/23/2007  . DEGENERATIVE JOINT DISEASE, LEFT KNEE 05/23/2007  . ESSENTIAL HYPERTENSION, BENIGN 05/20/2007    Past Surgical History:  Procedure Laterality Date  . TOE SURGERY    . TUBAL LIGATION      OB History    No data available       Home Medications    Prior to Admission medications   Medication Sig Start Date End Date Taking? Authorizing Provider  acetaminophen (TYLENOL) 500 MG tablet Take 1 tablet (500 mg total) by mouth every 6 (six) hours as needed. 07/07/14   [name], [name], PA-C  dextromethorphan-guaiFENesin (MUCINEX DM) 30-600 MG per 12 hr tablet Take 1 tablet by mouth 2 (two) times daily. 07/07/14   [name], [name], PA-C  diclofenac sodium (VOLTAREN) 1 % GEL Apply 2 g topically 4 (four) times daily. 09/12/16   [name], [name], PA-C  escitalopram (LEXAPRO) 10 MG tablet Take 10 mg by mouth daily.    [provider]  HYDROcodone-acetaminophen (NORCO/VICODIN) 5-325 MG tablet Take 1-2 tablets by mouth every 6 (six) hours as needed for moderate pain or severe pain. 12/12/16   [name], [name], MD  naproxen (NAPROSYN) 500 MG tablet Take 1 tablet (500 mg total) by mouth 2 (two) times daily. 11/17/16   [name], [name], PA-C  omeprazole (PRILOSEC) 20 MG capsule Take 1 capsule (20 mg total) by mouth daily. 10/24/12   [name], [name], PA-C  ondansetron (ZOFRAN ODT) 4 MG disintegrating tablet Take 1 tablet (4 mg total) by mouth every 8 (eight) hours as needed for nausea or vomiting. 11/17/16   [name], [name], PA-C  triamterene-hydrochlorothiazide (MAXZIDE-25) 37.5-25 MG  per tablet Take 1 each (1 tablet total) by mouth daily. 10/24/12   Roxy Horseman, PA-C    Family History No family history on file.  Social History Social History  Substance Use Topics  . Smoking status: Never Smoker  . Smokeless tobacco: Never Used  . Alcohol use No     Allergies   Aspirin and Ibuprofen   Review of Systems Review of Systems    Constitutional: Negative for chills and fever.  Musculoskeletal: Positive for arthralgias (left knee). Negative for gait problem and joint swelling.  Neurological: Negative for weakness and numbness.     Physical Exam Updated Vital Signs BP 129/79 (BP Location: Right Arm)   Pulse 74   Temp 97.5 F (36.4 C) (Oral)   Resp 14   SpO2 100%   Physical Exam  Constitutional: She is oriented to person, place, and time. She appears well-developed and well-nourished.  HENT:  Head: Normocephalic and atraumatic.  Eyes: EOM are normal. Pupils are equal, round, and reactive to light.  Neck: Normal range of motion. Neck supple.  Cardiovascular: Normal rate and regular rhythm.   Pulmonary/Chest: Effort normal and breath sounds normal.  Musculoskeletal: Normal range of motion. She exhibits edema and tenderness. She exhibits no deformity.  Limited range of motion due to pain, but full passive range of motion. Mild edema noted. No effusion, ecchymosis, erythema, or warmth. Tender to palpation over the patellar tendon and lateral medial joint lines. No LE edema. DP pulses 2+ bilaterally. Sensation intact. Capillary refill is normal.   Neurological: She is alert and oriented to person, place, and time.  Skin: Skin is warm and dry.  Psychiatric: She has a normal mood and affect.  Nursing note and vitals reviewed.    ED Treatments / Results  DIAGNOSTIC STUDIES: Oxygen Saturation is 100% on RA, normal by my interpretation.    COORDINATION OF CARE: 9:55 AM Discussed treatment plan with pt at bedside and pt agreed to plan, which includes ibuprofen.   Labs (all labs ordered are listed, but only abnormal results are displayed) Labs Reviewed - No data to display  EKG  EKG Interpretation None       Radiology Dg Knee Complete 4 Views Left  Result Date: 01/03/2017 CLINICAL DATA:  Chronic knee pain for years, diagnosed with arthritis 10 years ago, worsening pain, feels like bone-on-bone EXAM:  LEFT KNEE - COMPLETE 4+ VIEW COMPARISON:  11/16/2016 FINDINGS: Osseous demineralization. Medial compartment joint space narrowing and minimal spurring. Small knee joint effusion. No acute fracture, dislocation or bone destruction. IMPRESSION: Degenerative changes LEFT knee primarily at medial compartment with small associated joint effusion. No acute abnormalities. Electronically Signed   By: Ulyses Southward M.D.   On: 01/03/2017 10:32    Procedures Procedures (including critical care time)  Medications Ordered in ED Medications - No data to display   Initial Impression / Assessment and Plan / ED Course  I have reviewed the triage vital signs and the nursing notes.  Pertinent labs & imaging results that were available during my care of the patient were reviewed by me and considered in my medical decision making (see chart for details).      Patient resents with left sided acute on chronic knee pain. Patient is neurovascularly intact. Patient X-Ray negative for obvious fracture or dislocation. Does show extensive arthritic changes. No signs of septic arthritis. Is followed by orthopedics. Pain managed in ED. Pt advised to follow up with orthopedics if symptoms persist for possibility of missed fracture  diagnosis. Patient given brace while in ED, conservative therapy recommended and discussed. Patient will be dc home & is agreeable with above plan.     Final Clinical Impressions(s) / ED Diagnoses   Final diagnoses:  Chronic pain of left knee    New Prescriptions Current Discharge Medication List     I personally performed the services described in this documentation, which was scribed in my presence. The recorded information has been reviewed and is accurate.     Rise Mu, PA-C 01/03/17 1054    Jacalyn Lefevre, MD 01/03/17 1606

## 2017-01-03 NOTE — Discharge Instructions (Signed)
X-ray shows arthritic changes in her left knee. No acute abnormalities. Make she take the naproxen for pain twice a day. She may also take Tylenol. Use the knee sleeve for compression and comfort. Rest, ice, elevate her left knee. Make she'll follow up with an orthopedic doctor.

## 2017-01-03 NOTE — ED Triage Notes (Signed)
Pt has had ongoing left knee pain for several years states has seen a dr and was told it was it was bone on bone, she arrivies wearing knee brace and needs something for pain

## 2017-04-07 ENCOUNTER — Encounter (HOSPITAL_COMMUNITY): Payer: Self-pay

## 2017-04-07 DIAGNOSIS — I10 Essential (primary) hypertension: Secondary | ICD-10-CM | POA: Insufficient documentation

## 2017-04-07 DIAGNOSIS — M25561 Pain in right knee: Secondary | ICD-10-CM | POA: Insufficient documentation

## 2017-04-07 DIAGNOSIS — G8929 Other chronic pain: Secondary | ICD-10-CM | POA: Insufficient documentation

## 2017-04-07 DIAGNOSIS — Z79899 Other long term (current) drug therapy: Secondary | ICD-10-CM | POA: Insufficient documentation

## 2017-04-07 MED ORDER — OXYCODONE-ACETAMINOPHEN 5-325 MG PO TABS
1.0000 | ORAL_TABLET | ORAL | Status: DC | PRN
  Administered 2017-04-07: 1 via ORAL

## 2017-04-07 MED ORDER — OXYCODONE-ACETAMINOPHEN 5-325 MG PO TABS
ORAL_TABLET | ORAL | Status: AC
  Filled 2017-04-07: qty 1

## 2017-04-07 NOTE — ED Triage Notes (Signed)
Pt. Presents to ED with right knee pain. Pt. States she hit her right knee on the toilet at work 5 days ago and her pain continues to get worse.

## 2017-04-08 ENCOUNTER — Encounter (HOSPITAL_COMMUNITY): Payer: Self-pay | Admitting: Emergency Medicine

## 2017-04-08 ENCOUNTER — Emergency Department (HOSPITAL_COMMUNITY)
Admission: EM | Admit: 2017-04-08 | Discharge: 2017-04-08 | Disposition: A | Discharge status: Home / Self Care | Payer: Self-pay | Attending: Emergency Medicine | Admitting: Emergency Medicine

## 2017-04-08 ENCOUNTER — Emergency Department (HOSPITAL_COMMUNITY): Payer: Self-pay

## 2017-04-08 DIAGNOSIS — M25561 Pain in right knee: Secondary | ICD-10-CM

## 2017-04-08 DIAGNOSIS — G8929 Other chronic pain: Secondary | ICD-10-CM

## 2017-04-08 HISTORY — DX: Other chronic pain: G89.29

## 2017-04-08 HISTORY — DX: Pain in unspecified knee: M25.569

## 2017-04-08 MED ORDER — TRAMADOL HCL 50 MG PO TABS: 50.0000 mg | ORAL_TABLET | Freq: Four times a day (QID) | ORAL | 0 refills | Status: DC | PRN

## 2017-04-08 NOTE — ED Provider Notes (Signed)
[facility]PT Provider Note   CSN: [id number] Arrival date & time: 04/07/17  2327     History   Chief Complaint Chief Complaint  Patient presents with  . Knee Pain    HPI [name] is a 61 y.o. female.  HPI [name] is a 61 y.o. female With chronic bilateral knee pain due to osteoarthritis, used to be followed by pain clinic, presents to emergency department complaint of right knee pain. Patient states she hit her right knee on the toilet while at work 5 days ago. She states since then the pain has gotten severely worse. She states pain with any movement of the knee and with paring weight. She is not taking any medications for her pain at this time. She states she lost her Medicaid and unable to see her family doctor or her pain management doctor. She denies any numbness or weakness distal to the knee. She denies any other pain at this time. No treatment prior to coming in.  Past Medical History:  Diagnosis Date  . Chronic knee pain    L & R  . GERD (gastroesophageal reflux disease)   . Hypertension     Patient Active Problem List   Diagnosis Date Noted  . TRIGGER FINGER, LEFT THUMB 08/12/2009  . ANXIETY 05/20/2009  . CANDIDIASIS, SKIN 03/12/2009  . TINGLING 12/24/2008  . PHARYNGITIS 08/31/2008  . UPPER RESPIRATORY INFECTION 08/31/2008  . CHEST PAIN 02/03/2008  . HYPOKALEMIA 11/07/2007  . Other specified disorders of bladder 09/26/2007  . GASTRITIS 08/07/2007  . HEADACHE 08/07/2007  . CONSTIPATION 07/23/2007  . MICROSCOPIC HEMATURIA 07/23/2007  . DENTAL CARIES 05/27/2007  . TINEA PEDIS 05/23/2007  . ALLERGIC RHINITIS 05/23/2007  . GERD 05/23/2007  . HIATAL HERNIA 05/23/2007  . DEGENERATIVE JOINT DISEASE, LEFT KNEE 05/23/2007  . ESSENTIAL HYPERTENSION, BENIGN 05/20/2007    Past Surgical History:  Procedure Laterality Date  . TOE SURGERY    . TUBAL LIGATION      OB History    No data available       Home Medications    Prior to Admission  medications   Medication Sig Start Date End Date Taking? Authorizing Provider  acetaminophen (TYLENOL) 500 MG tablet Take 1 tablet (500 mg total) by mouth every 6 (six) hours as needed. 07/07/14   Everlene Farrier, PA-C  dextromethorphan-guaiFENesin (MUCINEX DM) 30-600 MG per 12 hr tablet Take 1 tablet by mouth 2 (two) times daily. 07/07/14   Everlene Farrier, PA-C  diclofenac sodium (VOLTAREN) 1 % GEL Apply 2 g topically 4 (four) times daily. 09/12/16   Audry Pili, PA-C  escitalopram (LEXAPRO) 10 MG tablet Take 10 mg by mouth daily.    [provider]  HYDROcodone-acetaminophen (NORCO/VICODIN) 5-325 MG tablet Take 1-2 tablets by mouth every 6 (six) hours as needed for moderate pain or severe pain. 12/12/16   Shaune Pollack, MD  naproxen (NAPROSYN) 375 MG tablet Take 1 tablet (375 mg total) by mouth 2 (two) times daily. 01/03/17   Rise Mu, PA-C  omeprazole (PRILOSEC) 20 MG capsule Take 1 capsule (20 mg total) by mouth daily. 10/24/12   Roxy Horseman, PA-C  ondansetron (ZOFRAN ODT) 4 MG disintegrating tablet Take 1 tablet (4 mg total) by mouth every 8 (eight) hours as needed for nausea or vomiting. 11/17/16   Renne Crigler, PA-C  traMADol (ULTRAM) 50 MG tablet Take 1 tablet (50 mg total) by mouth every 6 (six) hours as needed. 04/08/17   Joseff Luckman, Lemont Fillers, PA-C  triamterene-hydrochlorothiazide [facility])  37.5-25 MG per tablet Take 1 each (1 tablet total) by mouth daily. 10/24/12   Roxy Horseman, PA-C    Family History History reviewed. No pertinent family history.  Social History Social History  Substance Use Topics  . Smoking status: Never Smoker  . Smokeless tobacco: Never Used  . Alcohol use No     Allergies   Aspirin and Ibuprofen   Review of Systems Review of Systems  Constitutional: Negative for chills and fever.  Respiratory: Negative for cough, chest tightness and shortness of breath.   Cardiovascular: Negative for chest pain, palpitations and leg  swelling.  Musculoskeletal: Positive for arthralgias and joint swelling. Negative for myalgias, neck pain and neck stiffness.  Skin: Negative for rash.  Neurological: Negative for dizziness, weakness and headaches.  All other systems reviewed and are negative.    Physical Exam Updated Vital Signs BP (!) 147/81 (BP Location: Right Arm)   Pulse 79   Temp 98.1 F (36.7 C) (Oral)   Resp 16   Ht  (1.499 m)   Wt 65.8 kg (145 lb)   SpO2 99%   BMI 29.29 kg/m   Physical Exam  Constitutional: She appears well-developed and well-nourished. No distress.  HENT:  Head: Normocephalic.  Eyes: Conjunctivae are normal.  Neck: Neck supple.  Cardiovascular: Normal rate, regular rhythm and normal heart sounds.   Pulmonary/Chest: Effort normal and breath sounds normal. No respiratory distress. She has no wheezes. She has no rales.  Musculoskeletal: She exhibits no edema.  Normal-appearing right knee. Tenderness to palpation of her anterior knee, specifically over her patella and also over the medial joint. Pain with full flexion, patient is able to fully extend her knee. Negative anterior posterior drawer signs. No laxity of medial lateral stress. Dorsal pedal pulses intact.   Neurological: She is alert.  Skin: Skin is warm and dry.  Psychiatric: She has a normal mood and affect. Her behavior is normal.  Nursing note and vitals reviewed.    ED Treatments / Results  Labs (all labs ordered are listed, but only abnormal results are displayed) Labs Reviewed - No data to display  EKG  EKG Interpretation None       Radiology Dg Knee Complete 4 Views Right  Result Date: 04/08/2017 CLINICAL DATA:  Patient hit right knee against a twin lead 5 days ago. Persistent pain. EXAM: RIGHT KNEE - COMPLETE 4+ VIEW COMPARISON:  04/09/2012 FINDINGS: Some interval progression of femorotibial and patellofemoral joint osteoarthritis with joint space narrowing and spurring noted of the tibial spine,  femoral condyles, lateral tibial plateau and of the patella. No joint effusion, acute displaced fracture or bone destruction. No significant joint effusion. IMPRESSION: Some interval progression of osteoarthritis of the right knee. No acute osseous abnormality. Electronically Signed   By: Tollie Eth M.D.   On: 04/08/2017 03:39    Procedures Procedures (including critical care time)  Medications Ordered in ED Medications  oxyCODONE-acetaminophen (PERCOCET/ROXICET) 5-325 MG per tablet 1 tablet (1 tablet Oral Given 04/07/17 2357)     Initial Impression / Assessment and Plan / ED Course  I have reviewed the triage vital signs and the nursing notes.  Pertinent labs & imaging results that were available during my care of the patient were reviewed by me and considered in my medical decision making (see chart for details).    Patient in the emergency department with acute on chronic right knee pain after hitting it on the toilet. She is neurovascularly intact. Joint stable. X-ray was  obtained to rule out any new fractures or other abnormalities and is negative. Plan to discharge home, we'll provide him with tramadol for pain control. We'll give follow-up with [facility] wellness clinic. She is neurovascularly intact, joint is not warm or erythematous. I do not think she has an infection. No concern for patellar or ligamentous injury.  Vitals:   04/07/17 2332 04/07/17 2350 04/08/17 0418  BP: (!) 158/80  (!) 147/81  Pulse: 77  79  Resp: 18  16  Temp: 98.2 F (36.8 C)  98.1 F (36.7 C)  TempSrc: Oral  Oral  SpO2: 100%  99%  Weight: 65.8 kg (145 lb) 65.8 kg (145 lb)   Height:  (1.499 m)  (1.499 m)      Final Clinical Impressions(s) / ED Diagnoses   Final diagnoses:  Chronic pain of right knee    New Prescriptions Discharge Medication List as of 04/08/2017  4:05 AM    START taking these medications   Details  traMADol (ULTRAM) 50 MG tablet Take 1 tablet (50 mg total) by  mouth every 6 (six) hours as needed., Starting Sun 04/08/2017, Print         Kingstin Heims, [place], PA-C 04/08/17 1610    Gilda Crease, MD 04/16/17 [phone]

## 2017-04-08 NOTE — Discharge Instructions (Signed)
Stay off of your knee as much as possible. Ice and elevate. Take Tylenol for pain. take Ultram for severe pain. Follow-up with [facility].

## 2017-05-03 ENCOUNTER — Encounter (HOSPITAL_COMMUNITY): Payer: Self-pay | Admitting: *Deleted

## 2017-05-03 ENCOUNTER — Emergency Department (HOSPITAL_COMMUNITY)
Admission: EM | Admit: 2017-05-03 | Discharge: 2017-05-03 | Disposition: A | Discharge status: Home / Self Care | Payer: Self-pay | Attending: Emergency Medicine | Admitting: Emergency Medicine

## 2017-05-03 DIAGNOSIS — Z79899 Other long term (current) drug therapy: Secondary | ICD-10-CM | POA: Insufficient documentation

## 2017-05-03 DIAGNOSIS — B86 Scabies: Secondary | ICD-10-CM | POA: Insufficient documentation

## 2017-05-03 DIAGNOSIS — R21 Rash and other nonspecific skin eruption: Secondary | ICD-10-CM

## 2017-05-03 DIAGNOSIS — I10 Essential (primary) hypertension: Secondary | ICD-10-CM | POA: Insufficient documentation

## 2017-05-03 MED ORDER — PERMETHRIN 5 % EX CREA: TOPICAL_CREAM | CUTANEOUS | 0 refills | Status: DC

## 2017-05-03 MED ORDER — HYDROXYZINE HCL 25 MG PO TABS
25.0000 mg | ORAL_TABLET | Freq: Once | ORAL | Status: AC
  Administered 2017-05-03: 25 mg via ORAL
  Filled 2017-05-03: qty 1

## 2017-05-03 MED ORDER — PREDNISONE 10 MG (21) PO TBPK: ORAL_TABLET | Freq: Every day | ORAL | 0 refills | Status: DC

## 2017-05-03 NOTE — ED Provider Notes (Signed)
[facility]   CSN: [id number] Arrival date & time: 05/03/17  0715   History   Chief Complaint Chief Complaint  Patient presents with  . Rash    HPI [name] is a 61 y.o. female.  HPI   Patient has a past medical history of GERD, hypertension, chronic knee pain comes to the emergency department for evaluation of a full-body rash. Started on her left elbow and continued to spread. It is very itchy and uncomfortable, it waxes and wanes. She denies having any systemic symptoms of fever, nausea, vomiting, diarrhea, vaginal discharge, weight loss. She reports trying Benadryl and over-the-counter creams but nothing is helping to continue spread. She denies having had this before.  Past Medical History:  Diagnosis Date  . Chronic knee pain    L & R  . GERD (gastroesophageal reflux disease)   . Hypertension     Patient Active Problem List   Diagnosis Date Noted  . TRIGGER FINGER, LEFT THUMB 08/12/2009  . ANXIETY 05/20/2009  . CANDIDIASIS, SKIN 03/12/2009  . TINGLING 12/24/2008  . PHARYNGITIS 08/31/2008  . UPPER RESPIRATORY INFECTION 08/31/2008  . CHEST PAIN 02/03/2008  . HYPOKALEMIA 11/07/2007  . Other specified disorders of bladder 09/26/2007  . GASTRITIS 08/07/2007  . HEADACHE 08/07/2007  . CONSTIPATION 07/23/2007  . MICROSCOPIC HEMATURIA 07/23/2007  . DENTAL CARIES 05/27/2007  . TINEA PEDIS 05/23/2007  . ALLERGIC RHINITIS 05/23/2007  . GERD 05/23/2007  . HIATAL HERNIA 05/23/2007  . DEGENERATIVE JOINT DISEASE, LEFT KNEE 05/23/2007  . ESSENTIAL HYPERTENSION, BENIGN 05/20/2007    Past Surgical History:  Procedure Laterality Date  . TOE SURGERY    . TUBAL LIGATION      OB History    No data available       Home Medications    Prior to Admission medications   Medication Sig Start Date End Date Taking? Authorizing Provider  acetaminophen (TYLENOL) 500 MG tablet Take 1 tablet (500 mg total) by mouth every  6 (six) hours as needed. 07/07/14   [name], [name], PA-C  dextromethorphan-guaiFENesin (MUCINEX DM) 30-600 MG per 12 hr tablet Take 1 tablet by mouth 2 (two) times daily. 07/07/14   [name], [name], PA-C  diclofenac sodium (VOLTAREN) 1 % GEL Apply 2 g topically 4 (four) times daily. 09/12/16   [name], [name], PA-C  escitalopram (LEXAPRO) 10 MG tablet Take 10 mg by mouth daily.    [provider]  HYDROcodone-acetaminophen (NORCO/VICODIN) 5-325 MG tablet Take 1-2 tablets by mouth every 6 (six) hours as needed for moderate pain or severe pain. 12/12/16   [name], [name], MD  naproxen (NAPROSYN) 375 MG tablet Take 1 tablet (375 mg total) by mouth 2 (two) times daily. 01/03/17   Rise MuLeaphart, Kenneth T, PA-C  omeprazole (PRILOSEC) 20 MG capsule Take 1 capsule (20 mg total) by mouth daily. 10/24/12   [name], [name], PA-C  ondansetron (ZOFRAN ODT) 4 MG disintegrating tablet Take 1 tablet (4 mg total) by mouth every 8 (eight) hours as needed for nausea or vomiting. 11/17/16   [name], [name], PA-C  permethrin (ELIMITE) 5 % cream Apply to affected area once 05/03/17   Marlon PelGreene, Obelia Bonello, PA-C  predniSONE (STERAPRED UNI-PAK 21 TAB) 10 MG (21) TBPK tablet Take by mouth daily. Take 6 tabs by mouth daily  for 1 days, then 5 tabs for 1 days, then 4 tabs for 1 days, then 3 tabs for 1 days, 2 tabs for 1 days, then 1 tab by mouth daily for 1  days 05/03/17   Marlon Pel, PA-C  traMADol (ULTRAM) 50 MG tablet Take 1 tablet (50 mg total) by mouth every 6 (six) hours as needed. 04/08/17   Kirichenko, Tatyana, PA-C  triamterene-hydrochlorothiazide (MAXZIDE-25) 37.5-25 MG per tablet Take 1 each (1 tablet total) by mouth daily. 10/24/12   Roxy Horseman, PA-C    Family History No family history on file.  Social History Social History  Substance Use Topics  . Smoking status: Never Smoker  . Smokeless tobacco: Never Used  . Alcohol use No     Allergies   Aspirin and Ibuprofen   Review of Systems Review of  Systems Negative ROS aside from pertinent positives and negatives as listed in HPI   Physical Exam Updated Vital Signs BP (!) 160/75 (BP Location: Left Arm)   Pulse 88   Temp 98.5 F (36.9 C) (Oral)   Resp 17   Ht 4\' 11"  (1.499 m)   Wt 63.5 kg (140 lb)   SpO2 99%   BMI 28.28 kg/m   Physical Exam  Constitutional: She appears well-developed and well-nourished.  HENT:  Head: Normocephalic and atraumatic.  Eyes: Pupils are equal, round, and reactive to light. Conjunctivae are normal.  Neck: Trachea normal, normal range of motion and full passive range of motion without pain. Neck supple.  Cardiovascular: Normal rate, regular rhythm and normal pulses.   Pulmonary/Chest: Effort normal and breath sounds normal. Chest wall is not dull to percussion. She exhibits no tenderness, no crepitus, no edema, no deformity and no retraction.  Abdominal: Soft. Normal appearance and bowel sounds are normal.  Musculoskeletal: Normal range of motion.  Neurological: She is alert. She has normal strength.  Skin: Skin is warm, dry and intact. Rash noted.  Excoriated rash. Diffuse to body with no signs of abscess or cellulitis.  Psychiatric: She has a normal mood and affect. Her speech is normal and behavior is normal. Judgment and thought content normal. Cognition and memory are normal.    ED Treatments / Results  Labs (all labs ordered are listed, but only abnormal results are displayed) Labs Reviewed - No data to display  EKG  EKG Interpretation None       Radiology No results found.  Procedures Procedures (including critical care time)  Medications Ordered in ED Medications  hydrOXYzine (ATARAX/VISTARIL) tablet 25 mg (not administered)    Initial Impression / Assessment and Plan / ED Course  I have reviewed the triage vital signs and the nursing notes.  Pertinent labs & imaging results that were available during my care of the patient were reviewed by me and considered in my  medical decision making (see chart for details).     I had Dr. Denton Lank see the patient for recommendations. He recommends prednisone taper and Permethrin cream. She has been given referral back to PCP and dermatology. No red flag s/sx.  Blood pressure (!) 160/75, pulse 88, temperature 98.5 F (36.9 C), temperature source Oral, resp. rate 17, height 4\' 11"  (1.499 m), weight 63.5 kg (140 lb), SpO2 99 %.  Rilley Poulter has been evaluated today in the emergency department. The appropriate screening and testing was been performed and I believe the patient to be medically stable for discharge.   Return signs and symptoms have been discussed with the patient and/or caregivers and they have voiced their understanding. The patient has agreed to follow-up with their primary care provider or the referred specialist.   Final Clinical Impressions(s) / ED Diagnoses   Final diagnoses:  Scabies  Rash    New Prescriptions New Prescriptions   PERMETHRIN (ELIMITE) 5 % CREAM    Apply to affected area once   PREDNISONE (STERAPRED UNI-PAK 21 TAB) 10 MG (21) TBPK TABLET    Take by mouth daily. Take 6 tabs by mouth daily  for 1 days, then 5 tabs for 1 days, then 4 tabs for 1 days, then 3 tabs for 1 days, 2 tabs for 1 days, then 1 tab by mouth daily for 1 days     Marlon Pel, PA-C 05/03/17 1120    Cathren Laine, MD 05/03/17 1431

## 2017-05-03 NOTE — ED Triage Notes (Signed)
To ED with complaint of full body rash. 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 Now these areas have spread to her entire body. Some more raised than others. Pt states she has tried OTC creams and benadryl without relief.

## 2017-06-14 ENCOUNTER — Emergency Department (HOSPITAL_COMMUNITY)
Admission: EM | Admit: 2017-06-14 | Discharge: 2017-06-14 | Disposition: A | Discharge status: Home / Self Care | Payer: Self-pay | Attending: Emergency Medicine | Admitting: Emergency Medicine

## 2017-06-14 ENCOUNTER — Encounter (HOSPITAL_COMMUNITY): Payer: Self-pay | Admitting: *Deleted

## 2017-06-14 DIAGNOSIS — I10 Essential (primary) hypertension: Secondary | ICD-10-CM | POA: Insufficient documentation

## 2017-06-14 DIAGNOSIS — M25562 Pain in left knee: Secondary | ICD-10-CM | POA: Insufficient documentation

## 2017-06-14 DIAGNOSIS — Z79899 Other long term (current) drug therapy: Secondary | ICD-10-CM | POA: Insufficient documentation

## 2017-06-14 DIAGNOSIS — F419 Anxiety disorder, unspecified: Secondary | ICD-10-CM | POA: Insufficient documentation

## 2017-06-14 DIAGNOSIS — M25561 Pain in right knee: Secondary | ICD-10-CM | POA: Insufficient documentation

## 2017-06-14 DIAGNOSIS — G8929 Other chronic pain: Secondary | ICD-10-CM | POA: Insufficient documentation

## 2017-06-14 MED ORDER — NAPROXEN 250 MG PO TABS: 250.0000 mg | ORAL_TABLET | Freq: Two times a day (BID) | ORAL | 0 refills | Status: DC

## 2017-06-14 MED ORDER — DICLOFENAC SODIUM 3 % TD GEL: 1.0000 "application " | Freq: Two times a day (BID) | TRANSDERMAL | 0 refills | Status: DC

## 2017-06-14 NOTE — ED Notes (Signed)
Pt ambulated without assistance to the room 

## 2017-06-14 NOTE — ED Provider Notes (Signed)
[facility] Provider Note   CSN: [id number] Arrival date & time: 06/14/17  1510     History   Chief Complaint Chief Complaint  Patient presents with  . Knee Pain    HPI Chryl Heckaula Cen is a 61 y.o. female.  Chryl Heckaula Fahrney is a 61 y.o. Female presents to the emergency department complaining of ongoing bilateral knee pain for several years.  She reports she was diagnosed with arthritis several years ago and has been having gradually worsening bilateral knee pain.  She reports her pain is worse when she is at work and is standing and walking all day.  She does not have any medications at home for treatment of her symptoms.  No treatments attempted prior to arrival today.  She denies any injury or trauma to her knees.  She denies any joint swelling, redness or rashes.  She has been ambulatory.  She denies fevers, numbness, tingling, weakness or rashes.   The history is provided by the patient and medical records. No language interpreter was used.  Knee Pain   Pertinent negatives include no numbness.    Past Medical History:  Diagnosis Date  . Chronic knee pain    L & R  . GERD (gastroesophageal reflux disease)   . Hypertension     Patient Active Problem List   Diagnosis Date Noted  . TRIGGER FINGER, LEFT THUMB 08/12/2009  . ANXIETY 05/20/2009  . CANDIDIASIS, SKIN 03/12/2009  . TINGLING 12/24/2008  . PHARYNGITIS 08/31/2008  . UPPER RESPIRATORY INFECTION 08/31/2008  . CHEST PAIN 02/03/2008  . HYPOKALEMIA 11/07/2007  . Other specified disorders of bladder 09/26/2007  . GASTRITIS 08/07/2007  . HEADACHE 08/07/2007  . CONSTIPATION 07/23/2007  . MICROSCOPIC HEMATURIA 07/23/2007  . DENTAL CARIES 05/27/2007  . TINEA PEDIS 05/23/2007  . ALLERGIC RHINITIS 05/23/2007  . GERD 05/23/2007  . HIATAL HERNIA 05/23/2007  . DEGENERATIVE JOINT DISEASE, LEFT KNEE 05/23/2007  . ESSENTIAL HYPERTENSION, BENIGN 05/20/2007    Past Surgical History:    Procedure Laterality Date  . TOE SURGERY    . TUBAL LIGATION      OB History    No data available       Home Medications    Prior to Admission medications   Medication Sig Start Date End Date Taking? Authorizing Provider  acetaminophen (TYLENOL) 500 MG tablet Take 1 tablet (500 mg total) by mouth every 6 (six) hours as needed. 07/07/14   [name], Michal Strzelecki, PA-C  dextromethorphan-guaiFENesin (MUCINEX DM) 30-600 MG per 12 hr tablet Take 1 tablet by mouth 2 (two) times daily. 07/07/14   [name], Sevastian Witczak, PA-C  Diclofenac Sodium 3 % GEL Place 1 application onto the skin 2 (two) times daily. To affected area. 06/14/17   [name], Itzel Lowrimore, PA-C  escitalopram (LEXAPRO) 10 MG tablet Take 10 mg by mouth daily.    [provider]  HYDROcodone-acetaminophen (NORCO/VICODIN) 5-325 MG tablet Take 1-2 tablets by mouth every 6 (six) hours as needed for moderate pain or severe pain. 12/12/16   [name], [name], MD  naproxen (NAPROSYN) 250 MG tablet Take 1 tablet (250 mg total) by mouth 2 (two) times daily with a meal. 06/14/17   [name], Syriah Delisi, PA-C  omeprazole (PRILOSEC) 20 MG capsule Take 1 capsule (20 mg total) by mouth daily. 10/24/12   [name], [name], PA-C  ondansetron (ZOFRAN ODT) 4 MG disintegrating tablet Take 1 tablet (4 mg total) by mouth every 8 (eight) hours as needed for nausea or vomiting. 11/17/16   [name], [name],  PA-C  permethrin (ELIMITE) 5 % cream Apply to affected area once 05/03/17   Marlon PelGreene, Tiffany, PA-C  predniSONE (STERAPRED UNI-PAK 21 TAB) 10 MG (21) TBPK tablet Take by mouth daily. Take 6 tabs by mouth daily  for 1 days, then 5 tabs for 1 days, then 4 tabs for 1 days, then 3 tabs for 1 days, 2 tabs for 1 days, then 1 tab by mouth daily for 1 days 05/03/17   Marlon PelGreene, Tiffany, PA-C  triamterene-hydrochlorothiazide (MAXZIDE-25) 37.5-25 MG per tablet Take 1 each (1 tablet total) by mouth daily. 10/24/12   [name], [name], PA-C    Family History No family history on file.  Social  History Social History   Tobacco Use  . Smoking status: Never Smoker  . Smokeless tobacco: Never Used  Substance Use Topics  . Alcohol use: No  . Drug use: No     Allergies   Aspirin and Ibuprofen   Review of Systems Review of Systems  Constitutional: Negative for fever.  Musculoskeletal: Positive for arthralgias. Negative for joint swelling.  Skin: Negative for rash and wound.  Neurological: Negative for weakness and numbness.     Physical Exam Updated Vital Signs BP (!) 150/69   Pulse 73   Temp 98.3 F (36.8 C) (Oral)   Resp 16   SpO2 98%   Physical Exam  Constitutional: She appears well-developed and well-nourished. No distress.  HENT:  Head: Normocephalic and atraumatic.  Eyes: Right eye exhibits no discharge. Left eye exhibits no discharge.  Cardiovascular: Normal rate, regular rhythm and intact distal pulses.  Bilateral dorsalis pedis and posterior tibialis pulses are intact.   Pulmonary/Chest: Effort normal. No respiratory distress.  Musculoskeletal: Normal range of motion. She exhibits no edema or deformity.  Patient has some pain with range of motion of her bilateral knees.  No overlying skin changes.  No joint erythema, edema or ecchymosis.  No deformity noted.  Good range of motion to her bilateral knees.  No calf edema or tenderness.  No knee instability noted bilaterally.  Neurological: She is alert. No sensory deficit. She exhibits normal muscle tone. Coordination normal.  Skin: Skin is warm and dry. Capillary refill takes less than 2 seconds. No rash noted. She is not diaphoretic. No erythema. No pallor.  Psychiatric: She has a normal mood and affect. Her behavior is normal.  Nursing note and vitals reviewed.    ED Treatments / Results  Labs (all labs ordered are listed, but only abnormal results are displayed) Labs Reviewed - No data to display  EKG  EKG Interpretation None       Radiology No results found.  Procedures Procedures  (including critical care time)  Medications Ordered in ED Medications - No data to display   Initial Impression / Assessment and Plan / ED Course  I have reviewed the triage vital signs and the nursing notes.  Pertinent labs & imaging results that were available during my care of the patient were reviewed by me and considered in my medical decision making (see chart for details).     This  is a 61 y.o. Female presents to the emergency department complaining of ongoing bilateral knee pain for several years.  She reports she was diagnosed with arthritis several years ago and has been having gradually worsening bilateral knee pain.  She reports her pain is worse when she is at work and is standing and walking all day.  She does not have any medications at home for treatment of her  symptoms.  No treatments attempted prior to arrival today.  She denies any injury or trauma to her knees.  She denies any joint swelling, redness or rashes.  She has been ambulatory. On exam the patient is afebrile nontoxic-appearing.  She has good range of motion of her bilateral knees, albeit with some pain.  No overlying skin changes.  She is neurovascularly intact.  She has ongoing problems with arthritis.  She is out of any medications to help with her pain.  She is taken naproxen previously and tolerated this well.  Will discharge with prescription for Voltaren gel as well as naproxen and have her follow-up closely with primary care for possible referral to orthopedic surgery. I advised the patient to follow-up with their primary care provider this week. I advised the patient to return to the emergency department with new or worsening symptoms or new concerns. The patient verbalized understanding and agreement with plan.     Final Clinical Impressions(s) / ED Diagnoses   Final diagnoses:  Chronic pain of both knees    ED Discharge Orders        Ordered    Diclofenac Sodium 3 % GEL  2 times daily     06/14/17  1831    naproxen (NAPROSYN) 250 MG tablet  2 times daily with meals     06/14/17 1831       Everlene Farrier, PA-C 06/14/17 1839    Linwood Dibbles, MD 06/14/17 2349

## 2017-06-14 NOTE — ED Triage Notes (Signed)
To ED for eval of bilateral knee pain. States she was told she has arthritis and pain is getting worse. No injury. ambulatory

## 2017-11-21 ENCOUNTER — Encounter (HOSPITAL_COMMUNITY): Payer: Self-pay

## 2017-11-21 ENCOUNTER — Emergency Department (HOSPITAL_COMMUNITY): Payer: Commercial Managed Care - PPO

## 2017-11-21 ENCOUNTER — Emergency Department (HOSPITAL_COMMUNITY)
Admission: EM | Admit: 2017-11-21 | Discharge: 2017-11-21 | Disposition: A | Discharge status: Home / Self Care | Payer: Commercial Managed Care - PPO | Attending: Emergency Medicine | Admitting: Emergency Medicine

## 2017-11-21 DIAGNOSIS — S6721XA Crushing injury of right hand, initial encounter: Secondary | ICD-10-CM

## 2017-11-21 DIAGNOSIS — W231XXA Caught, crushed, jammed, or pinched between stationary objects, initial encounter: Secondary | ICD-10-CM | POA: Diagnosis not present

## 2017-11-21 DIAGNOSIS — Y999 Unspecified external cause status: Secondary | ICD-10-CM | POA: Insufficient documentation

## 2017-11-21 DIAGNOSIS — I10 Essential (primary) hypertension: Secondary | ICD-10-CM | POA: Insufficient documentation

## 2017-11-21 DIAGNOSIS — Y929 Unspecified place or not applicable: Secondary | ICD-10-CM | POA: Diagnosis not present

## 2017-11-21 DIAGNOSIS — Y939 Activity, unspecified: Secondary | ICD-10-CM | POA: Diagnosis not present

## 2017-11-21 DIAGNOSIS — Z79899 Other long term (current) drug therapy: Secondary | ICD-10-CM | POA: Diagnosis not present

## 2017-11-21 MED ORDER — FENTANYL CITRATE (PF) 100 MCG/2ML IJ SOLN
50.0000 ug | Freq: Once | INTRAMUSCULAR | Status: AC
  Administered 2017-11-21: 50 ug via INTRAVENOUS
  Filled 2017-11-21: qty 2

## 2017-11-21 NOTE — ED Notes (Signed)
Bed: ZO10 Expected date:  Expected time:  Means of arrival:  Comments: EMS/Hand injury

## 2017-11-21 NOTE — Discharge Instructions (Signed)
Your x-rays today showed no evidence of fractures however it is possible to have an occult fracture that was missed on imaging today.  Because of the tenderness near your thumb, we are placing you in a splint.  Please follow-up with the hand doctor in the next 2 weeks for reevaluation and further management.  Please use over-the-counter anti-inflammatories and pain medicines to help with your symptoms.  Please keep it elevated and you may use ice.  If any symptoms change or worsen, please return to the nearest emergency department.

## 2017-11-21 NOTE — ED Provider Notes (Signed)
[name] Provider Note   CSN: [id number] Arrival date & time: 11/21/17  7829     History   Chief Complaint No chief complaint on file.   HPI [name] is a 62 y.o. female.  The history is provided by the patient and the EMS personnel.  Hand Injury   The incident occurred less than 1 hour ago. Incident location: at [employer]. The injury mechanism was a direct blow (door crush). The pain is present in the right hand and right fingers. The quality of the pain is described as aching. The pain is at a severity of 8/10. The pain is severe. The pain has been constant since the incident. Pertinent negatives include no fever and no malaise/fatigue. It is unknown if a foreign body is present. The symptoms are aggravated by movement, palpation and use. She has tried nothing for the symptoms. The treatment provided no relief.    Past Medical History:  Diagnosis Date  . Chronic knee pain    L & R  . GERD (gastroesophageal reflux disease)   . Hypertension     Patient Active Problem List   Diagnosis Date Noted  . TRIGGER FINGER, LEFT THUMB 08/12/2009  . ANXIETY 05/20/2009  . CANDIDIASIS, SKIN 03/12/2009  . TINGLING 12/24/2008  . PHARYNGITIS 08/31/2008  . UPPER RESPIRATORY INFECTION 08/31/2008  . CHEST PAIN 02/03/2008  . HYPOKALEMIA 11/07/2007  . Other specified disorders of bladder 09/26/2007  . GASTRITIS 08/07/2007  . HEADACHE 08/07/2007  . CONSTIPATION 07/23/2007  . MICROSCOPIC HEMATURIA 07/23/2007  . DENTAL CARIES 05/27/2007  . TINEA PEDIS 05/23/2007  . ALLERGIC RHINITIS 05/23/2007  . GERD 05/23/2007  . HIATAL HERNIA 05/23/2007  . DEGENERATIVE JOINT DISEASE, LEFT KNEE 05/23/2007  . ESSENTIAL HYPERTENSION, BENIGN 05/20/2007    Past Surgical History:  Procedure Laterality Date  . TOE SURGERY    . TUBAL LIGATION       OB History   None      Home Medications    Prior to Admission medications   Medication Sig Start Date End  Date Taking? Authorizing Provider  acetaminophen (TYLENOL) 500 MG tablet Take 1 tablet (500 mg total) by mouth every 6 (six) hours as needed. 07/07/14   Everlene Farrier, PA-C  dextromethorphan-guaiFENesin (MUCINEX DM) 30-600 MG per 12 hr tablet Take 1 tablet by mouth 2 (two) times daily. 07/07/14   Everlene Farrier, PA-C  Diclofenac Sodium 3 % GEL Place 1 application onto the skin 2 (two) times daily. To affected area. 06/14/17   Everlene Farrier, PA-C  escitalopram (LEXAPRO) 10 MG tablet Take 10 mg by mouth daily.    [provider]  HYDROcodone-acetaminophen (NORCO/VICODIN) 5-325 MG tablet Take 1-2 tablets by mouth every 6 (six) hours as needed for moderate pain or severe pain. 12/12/16   Shaune Pollack, MD  naproxen (NAPROSYN) 250 MG tablet Take 1 tablet (250 mg total) by mouth 2 (two) times daily with a meal. 06/14/17   Everlene Farrier, PA-C  omeprazole (PRILOSEC) 20 MG capsule Take 1 capsule (20 mg total) by mouth daily. 10/24/12   Roxy Horseman, PA-C  ondansetron (ZOFRAN ODT) 4 MG disintegrating tablet Take 1 tablet (4 mg total) by mouth every 8 (eight) hours as needed for nausea or vomiting. 11/17/16   Renne Crigler, PA-C  permethrin (ELIMITE) 5 % cream Apply to affected area once 05/03/17   Marlon Pel, PA-C  predniSONE (STERAPRED UNI-PAK 21 TAB) 10 MG (21) TBPK tablet Take by mouth daily. Take 6 tabs by mouth  daily  for 1 days, then 5 tabs for 1 days, then 4 tabs for 1 days, then 3 tabs for 1 days, 2 tabs for 1 days, then 1 tab by mouth daily for 1 days 05/03/17   Marlon Pel, PA-C  triamterene-hydrochlorothiazide (MAXZIDE-25) 37.5-25 MG per tablet Take 1 each (1 tablet total) by mouth daily. 10/24/12   Roxy Horseman, PA-C    Family History History reviewed. No pertinent family history.  Social History Social History   Tobacco Use  . Smoking status: Never Smoker  . Smokeless tobacco: Never Used  Substance Use Topics  . Alcohol use: No  . Drug use: No      Allergies   Aspirin and Ibuprofen   Review of Systems Review of Systems  Constitutional: Negative for fever and malaise/fatigue.  Eyes: Negative for visual disturbance.  Respiratory: Negative for shortness of breath.   Cardiovascular: Negative for chest pain and palpitations.  Gastrointestinal: Negative for abdominal pain, diarrhea, nausea and vomiting.  Genitourinary: Negative for dysuria and hematuria.  Musculoskeletal: Negative for back pain.  Skin: Positive for wound. Negative for rash.  Neurological: Negative for headaches.  Psychiatric/Behavioral: Negative for agitation.  All other systems reviewed and are negative.    Physical Exam Updated Vital Signs BP (!) 179/92 (BP Location: Left Arm)   Pulse 60   Temp 97.8 F (36.6 C) (Oral)   Resp 18   SpO2 100%   Physical Exam  Constitutional: She appears well-developed and well-nourished. No distress.  HENT:  Head: Normocephalic.  Nose: Nose normal.  Mouth/Throat: Oropharynx is clear and moist. No oropharyngeal exudate.  Eyes: Pupils are equal, round, and reactive to light. Conjunctivae and EOM are normal.  Neck: Normal range of motion.  Cardiovascular: Normal rate and intact distal pulses.  No murmur heard. Pulmonary/Chest: Effort normal. No respiratory distress. She has no wheezes. She exhibits no tenderness.  Abdominal: Soft. Bowel sounds are normal. She exhibits no distension. There is no tenderness.  Musculoskeletal: She exhibits edema and tenderness.       Right hand: She exhibits tenderness, laceration (1mm) and swelling. She exhibits normal capillary refill and no deformity. Normal sensation noted. Normal strength noted.       Hands: Skin: Capillary refill takes less than 2 seconds. She is not diaphoretic. No erythema.  Psychiatric: She has a normal mood and affect.  Nursing note and vitals reviewed.           ED Treatments / Results  Labs (all labs ordered are listed, but only abnormal results  are displayed) Labs Reviewed - No data to display  EKG None  Radiology Dg Wrist Complete Right  Result Date: 11/21/2017 CLINICAL DATA:  Shut hand in car door EXAM: RIGHT WRIST - COMPLETE 3+ VIEW COMPARISON:  None. FINDINGS: Frontal, oblique, lateral, and ulnar deviation scaphoid images were obtained. There is no evident fracture or dislocation. Joint spaces appear normal. No erosive change. No radiopaque foreign body. A small calcification is noted medial to the hamate bone, likely of arthropathic etiology. IMPRESSION: No acute fracture or dislocation. No radiopaque foreign body. No appreciable joint space narrowing or erosion. Electronically Signed   By: Bretta Bang III M.D.   On: 11/21/2017 10:49   Dg Hand Complete Right  Result Date: 11/21/2017 CLINICAL DATA:  Acute RIGHT hand injury in car door today. Initial encounter. EXAM: RIGHT HAND - COMPLETE 3+ VIEW COMPARISON:  None. FINDINGS: There is no evidence of fracture or dislocation. There is no evidence of arthropathy or other  focal bone abnormality. Soft tissues are unremarkable. IMPRESSION: Negative. Electronically Signed   By: Harmon Pier M.D.   On: 11/21/2017 10:48    Procedures Procedures (including critical care time)  Medications Ordered in ED Medications  fentaNYL (SUBLIMAZE) injection 50 mcg (50 mcg Intravenous Given 11/21/17 1008)     Initial Impression / Assessment and Plan / ED Course  I have reviewed the triage vital signs and the nursing notes.  Pertinent labs & imaging results that were available during my care of the patient were reviewed by me and considered in my medical decision making (see chart for details).     [name] is a 62 y.o. female with a past medical history significant for hypertension GERD who is right-handed who presents with right hand injury.  Patient reports that she was at [employer] getting help with a using her vehicle when the car door was accidentally slammed into her right hand.  She  reports pain in the mid hand and her fingers.  She reports pain throughout the hand.  She reports it is severe.  She received fentanyl in route with some improvement in discomfort.  She is currently 8 out of 10 in severity.  She also reports   Mild pain in the right wrist.  No significant pain in the elbow or rest of the body.  Lungs clear and chest nontender.  Patient appears well otherwise.  On exam, patient has a 1 mm laceration to the pad of the right middle finger.  Currently hemostatic however there is dried blood surrounding.  Patient reports her tetanus is up-to-date.  Normal sensation in all fingers.  Patient can wiggle all fingers.  Normal radial and ulnar pulse.  Tenderness to palpation present in entire hand.  Minimal tenderness in the right snuffbox.   Patient will have x-rays of the hand and wrist.  Patient was given more fentanyl.  Anticipate reassessment after imaging.   11:02 AM Surprisingly, x-ray showed no evidence of fractures or dislocations.  Patient was reassessed and she had begun to wiggle her fingers better.  Suspect soft tissue injuries.  Given the small amount of tenderness in the anatomical snuffbox, patient will be placed in a thumb spica removable splint and be given instructions to follow-up with the hand team in the next week for reassessment and further management.  Patient's wound was again reexamined and does not appear to need any type of intervention or sutures at this is a superficial laceration/abrasion with no further bleeding.  Hand will be cleaned and patient will be placed in the splint.  Band-Aid placed on the small skin injury.  Patient will be discharged home with hand follow-up and return precautions.   Patient discharged in good condition with improvement in symptoms.   Final Clinical Impressions(s) / ED Diagnoses   Final diagnoses:  Hand crush injury, right, initial encounter    ED Discharge Orders    None      Clinical Impression: 1. Hand  crush injury, right, initial encounter     Disposition: Discharge  Condition: Good  I have discussed the results, Dx and Tx plan with the pt(& family if present). He/she/they expressed understanding and agree(s) with the plan. Discharge instructions discussed at great length. Strict return precautions discussed and pt &/or family have verbalized understanding of the instructions. No further questions at time of discharge.    New Prescriptions   No medications on file    Follow Up: Dairl Ponder, MD [address] [place]  [address] [phone]     Medicine, [facility] [address] [place] [address] [phone]     [facility] [address] [id number] mc [address] [place] [address] [phone]       Tegeler, Canary Brim, MD 11/21/17 [phone]

## 2017-11-21 NOTE — ED Notes (Signed)
Cleaned incision/wound on hand. Placed Band-Aid on cut. Will call ortho-tech for Thumb spica.

## 2017-11-21 NOTE — ED Triage Notes (Addendum)
Patient [facility]. Patient slammed her right hand in the door. Significant swelling to right knuckles. No LOC. No other injuries. Remembers everything leading up to it. 22 Left ac. of fentanyl. Pain went from a 10 to a 6.

## 2017-12-04 ENCOUNTER — Other Ambulatory Visit: Payer: Self-pay | Admitting: Family Medicine

## 2017-12-04 DIAGNOSIS — Z1231 Encounter for screening mammogram for malignant neoplasm of breast: Secondary | ICD-10-CM

## 2017-12-27 ENCOUNTER — Ambulatory Visit: Payer: Commercial Managed Care - PPO

## 2018-03-15 LAB — HEPATIC FUNCTION PANEL
ALT: 15 (ref 7–35)
AST: 16 (ref 13–35)
Alkaline Phosphatase: 77 (ref 25–125)
Bilirubin, Total: 0.5

## 2018-03-15 LAB — BASIC METABOLIC PANEL
BUN: 15 (ref 4–21)
CO2: 29 — AB (ref 13–22)
Chloride: 100 (ref 99–108)
Creatinine: 0.8 (ref 0.5–1.1)
Glucose: 112
Potassium: 4.3 (ref 3.4–5.3)
Sodium: 143 (ref 137–147)

## 2018-03-15 LAB — LIPID PANEL
Cholesterol: 175 (ref 0–200)
HDL: 51 (ref 35–70)
LDL Cholesterol: 105
Triglycerides: 93 (ref 40–160)

## 2018-03-15 LAB — COMPREHENSIVE METABOLIC PANEL
Albumin: 4.4 (ref 3.5–5.0)
Calcium: 10.3 (ref 8.7–10.7)
Globulin: 3.3

## 2018-04-08 LAB — TSH: TSH: 0.67 (ref 0.41–5.90)

## 2018-04-08 LAB — HEMOGLOBIN A1C: Hemoglobin A1C: 6.4

## 2018-07-11 HISTORY — PX: JOINT REPLACEMENT: SHX530

## 2019-02-05 LAB — COMPREHENSIVE METABOLIC PANEL
Albumin: 4.1 (ref 3.5–5.0)
Calcium: 9.4 (ref 8.7–10.7)
Globulin: 2.7

## 2019-02-05 LAB — HEPATIC FUNCTION PANEL
ALT: 11 (ref 7–35)
AST: 12 — AB (ref 13–35)
Alkaline Phosphatase: 74 (ref 25–125)
Bilirubin, Total: 0.3

## 2019-02-05 LAB — LIPID PANEL
Cholesterol: 177 (ref 0–200)
HDL: 46 (ref 35–70)
LDL Cholesterol: 107
Triglycerides: 118 (ref 40–160)

## 2019-02-05 LAB — BASIC METABOLIC PANEL
BUN: 15 (ref 4–21)
CO2: 26 — AB (ref 13–22)
Chloride: 101 (ref 99–108)
Creatinine: 0.8 (ref 0.5–1.1)
Glucose: 100
Potassium: 3.9 (ref 3.4–5.3)
Sodium: 142 (ref 137–147)

## 2019-02-20 LAB — MICROALBUMIN, URINE: Microalb, Ur: 6.3

## 2019-08-12 LAB — BASIC METABOLIC PANEL
BUN: 17 (ref 4–21)
CO2: 20 (ref 13–22)
Chloride: 104 (ref 99–108)
Creatinine: 0.8 (ref 0.5–1.1)
Glucose: 89
Potassium: 4.3 (ref 3.4–5.3)
Sodium: 145 (ref 137–147)

## 2019-08-12 LAB — COMPREHENSIVE METABOLIC PANEL: Calcium: 10.3 (ref 8.7–10.7)

## 2019-08-12 LAB — TSH: TSH: 0.71 (ref 0.41–5.90)

## 2020-07-10 DIAGNOSIS — M79651 Pain in right thigh: Secondary | ICD-10-CM

## 2020-07-10 HISTORY — DX: Pain in right thigh: M79.651

## 2021-01-14 LAB — LIPID PANEL
Cholesterol: 167 (ref 0–200)
HDL: 52 (ref 35–70)
LDL Cholesterol: 88
Triglycerides: 173 — AB (ref 40–160)

## 2021-01-14 LAB — BASIC METABOLIC PANEL
BUN: 19 (ref 4–21)
CO2: 29 — AB (ref 13–22)
Chloride: 100 (ref 99–108)
Creatinine: 0.8 (ref 0.5–1.1)
Glucose: 87
Potassium: 4.1 (ref 3.4–5.3)
Sodium: 140 (ref 137–147)

## 2021-01-14 LAB — TSH: TSH: 0.65 (ref 0.41–5.90)

## 2021-01-14 LAB — HEPATIC FUNCTION PANEL
ALT: 21 (ref 7–35)
AST: 19 (ref 13–35)
Alkaline Phosphatase: 60 (ref 25–125)
Bilirubin, Total: 0.4

## 2021-01-14 LAB — COMPREHENSIVE METABOLIC PANEL
Albumin: 4.2 (ref 3.5–5.0)
Calcium: 10 (ref 8.7–10.7)
Globulin: 3.1

## 2021-01-14 LAB — VITAMIN D 25 HYDROXY (VIT D DEFICIENCY, FRACTURES): Vit D, 25-Hydroxy: 24

## 2021-01-14 LAB — HM HEPATITIS C SCREENING LAB: HM Hepatitis Screen: NEGATIVE

## 2021-01-14 LAB — CBC AND DIFFERENTIAL
HCT: 40 (ref 36–46)
Hemoglobin: 13.4 (ref 12.0–16.0)
Neutrophils Absolute: 3929
Platelets: 304 (ref 150–399)
WBC: 7.6

## 2021-01-14 LAB — MICROALBUMIN, URINE: Microalb, Ur: 0.5

## 2021-01-14 LAB — HEMOGLOBIN A1C: Hemoglobin A1C: 6.4

## 2021-01-14 LAB — CBC: RBC: 4.28 (ref 3.87–5.11)

## 2021-03-09 ENCOUNTER — Encounter: Payer: Self-pay | Admitting: Nurse Practitioner

## 2021-03-09 ENCOUNTER — Ambulatory Visit (INDEPENDENT_AMBULATORY_CARE_PROVIDER_SITE_OTHER): Payer: Medicare HMO | Admitting: Nurse Practitioner

## 2021-03-09 ENCOUNTER — Other Ambulatory Visit: Payer: Self-pay

## 2021-03-09 VITALS — BP 122/78 | HR 60 | Temp 97.9°F | Ht 60.0 in | Wt 175.0 lb

## 2021-03-09 DIAGNOSIS — R739 Hyperglycemia, unspecified: Secondary | ICD-10-CM | POA: Diagnosis not present

## 2021-03-09 DIAGNOSIS — Z23 Encounter for immunization: Secondary | ICD-10-CM | POA: Diagnosis not present

## 2021-03-09 DIAGNOSIS — Z1211 Encounter for screening for malignant neoplasm of colon: Secondary | ICD-10-CM

## 2021-03-09 DIAGNOSIS — M8949 Other hypertrophic osteoarthropathy, multiple sites: Secondary | ICD-10-CM

## 2021-03-09 DIAGNOSIS — Z800 Family history of malignant neoplasm of digestive organs: Secondary | ICD-10-CM

## 2021-03-09 DIAGNOSIS — I10 Essential (primary) hypertension: Secondary | ICD-10-CM | POA: Diagnosis not present

## 2021-03-09 DIAGNOSIS — Z1212 Encounter for screening for malignant neoplasm of rectum: Secondary | ICD-10-CM

## 2021-03-09 DIAGNOSIS — M150 Primary generalized (osteo)arthritis: Secondary | ICD-10-CM

## 2021-03-09 DIAGNOSIS — E6609 Other obesity due to excess calories: Secondary | ICD-10-CM | POA: Diagnosis not present

## 2021-03-09 DIAGNOSIS — Z6832 Body mass index (BMI) 32.0-32.9, adult: Secondary | ICD-10-CM

## 2021-03-09 DIAGNOSIS — M159 Polyosteoarthritis, unspecified: Secondary | ICD-10-CM

## 2021-03-09 DIAGNOSIS — Z1231 Encounter for screening mammogram for malignant neoplasm of breast: Secondary | ICD-10-CM

## 2021-03-09 DIAGNOSIS — E782 Mixed hyperlipidemia: Secondary | ICD-10-CM

## 2021-03-09 DIAGNOSIS — E559 Vitamin D deficiency, unspecified: Secondary | ICD-10-CM | POA: Diagnosis not present

## 2021-03-09 DIAGNOSIS — E2839 Other primary ovarian failure: Secondary | ICD-10-CM

## 2021-03-09 NOTE — Progress Notes (Signed)
Careteam: Patient Care Team: [name], NP as PCP - General (Geriatric Medicine)  PLACE OF SERVICE:  [facility]  Advanced Directive information Does Patient Have a Medical Advance Directive?: No, Would patient like information on creating a medical advance directive?: No - Patient declined  Allergies  Allergen Reactions   Aspirin Other (See Comments)    Acid reflux   Ibuprofen Other (See Comments)    Acid reflux     Chief Complaint  Patient presents with   Establish Care    New patient establish care. Discuss celebrex, patient took in the past for arthritis. Fasting if labs due. Flu vaccine today      HPI: Patient is a 65 y.o. female to establish care.  She moved here from [place]. Looking for PCP.  Insurance recommended our office.   Reports hyperglycemia- borderline diabetic. Was checking blood sugar.   Osteoarthritis- taking tylenol arthritis daily. Knee replacement on the right. She had orthopedic in [place]. Also has right hip pain. Has had pain for 15-20 years.  Htn- on lisinopril-HCTZ  Hyperlipidemia- taking Crestor.    Mother died of colon cancer, diagnosised in 39s. She reports she had colonoscopy at 41 in [place]  Review of Systems:  Review of Systems  Constitutional:  Negative for chills, fever and weight loss.  HENT:  Negative for tinnitus.   Respiratory:  Negative for cough, sputum production and shortness of breath.   Cardiovascular:  Negative for chest pain, palpitations and leg swelling.  Gastrointestinal:  Negative for abdominal pain, constipation, diarrhea and heartburn.  Genitourinary:  Negative for dysuria, frequency and urgency.  Musculoskeletal:  Positive for joint pain. Negative for back pain, falls and myalgias.  Skin: Negative.   Neurological:  Negative for dizziness and headaches.  Psychiatric/Behavioral:  Negative for depression and memory loss. The patient does not have insomnia.    Past Medical History:  Diagnosis Date    Arthritis    Knee and hip   Chronic knee pain    L & R   GERD (gastroesophageal reflux disease)    Hyperlipidemia    Hypertension    Past Surgical History:  Procedure Laterality Date   TOE SURGERY     TUBAL LIGATION     Social History:   reports that she has never smoked. She has never used smokeless tobacco. She reports that she does not drink alcohol and does not use drugs.  Family History  Problem Relation Age of Onset   Cancer Mother    Cancer Father    Seizures Brother    Epilepsy Brother    Other Daughter        Murder in 2014    Medications: Patient's Medications  New Prescriptions   No medications on file  Previous Medications   ACETAMINOPHEN (TYLENOL ARTHRITIS PAIN PO)    Take 650 mg by mouth. Take 1-2 tablets daily   LISINOPRIL-HYDROCHLOROTHIAZIDE (ZESTORETIC) 20-12.5 MG TABLET    Take 1 tablet by mouth daily.   ROSUVASTATIN (CRESTOR) 5 MG TABLET    Take 5 mg by mouth at bedtime.  Modified Medications   No medications on file  Discontinued Medications   ACETAMINOPHEN (TYLENOL) 500 MG TABLET    Take 1 tablet (500 mg total) by mouth every 6 (six) hours as needed.   DEXTROMETHORPHAN-GUAIFENESIN (MUCINEX DM) 30-600 MG PER 12 HR TABLET    Take 1 tablet by mouth 2 (two) times daily.   DICLOFENAC SODIUM 3 % GEL    Place 1 application  onto the skin 2 (two) times daily. To affected area.   ESCITALOPRAM (LEXAPRO) 10 MG TABLET    Take 10 mg by mouth daily.   HYDROCODONE-ACETAMINOPHEN (NORCO/VICODIN) 5-325 MG TABLET    Take 1-2 tablets by mouth every 6 (six) hours as needed for moderate pain or severe pain.   NAPROXEN (NAPROSYN) 250 MG TABLET    Take 1 tablet (250 mg total) by mouth 2 (two) times daily with a meal.   OMEPRAZOLE (PRILOSEC) 20 MG CAPSULE    Take 1 capsule (20 mg total) by mouth daily.   ONDANSETRON (ZOFRAN ODT) 4 MG DISINTEGRATING TABLET    Take 1 tablet (4 mg total) by mouth every 8 (eight) hours as needed for nausea or vomiting.   PERMETHRIN (ELIMITE) 5  % CREAM    Apply to affected area once   PREDNISONE (STERAPRED UNI-PAK 21 TAB) 10 MG (21) TBPK TABLET    Take by mouth daily. Take 6 tabs by mouth daily  for 1 days, then 5 tabs for 1 days, then 4 tabs for 1 days, then 3 tabs for 1 days, 2 tabs for 1 days, then 1 tab by mouth daily for 1 days   TIZANIDINE HCL PO    Take 2 mg by mouth. Take 1-2 tablets every 8 hours.   TRIAMTERENE-HYDROCHLOROTHIAZIDE (MAXZIDE-25) 37.5-25 MG PER TABLET    Take 1 each (1 tablet total) by mouth daily.   TURMERIC CURCUMIN 500 MG CAPS    Take 500 mg by mouth daily.    Physical Exam:  Vitals:   03/09/21 1020  BP: 122/78  Pulse: 60  Temp: 97.9 F (36.6 C)  TempSrc: Temporal  SpO2: 98%  Weight: 175 lb (79.4 kg)  Height: 5' (1.524 m)   Body mass index is 34.18 kg/m. Wt Readings from Last 3 Encounters:  03/09/21 175 lb (79.4 kg)  05/03/17 140 lb (63.5 kg)  04/07/17 145 lb (65.8 kg)    Physical Exam Constitutional:      General: She is not in acute distress.    Appearance: She is well-developed. She is not diaphoretic.  HENT:     Head: Normocephalic and atraumatic.     Mouth/Throat:     Pharynx: No oropharyngeal exudate.  Eyes:     Conjunctiva/sclera: Conjunctivae normal.     Pupils: Pupils are equal, round, and reactive to light.  Cardiovascular:     Rate and Rhythm: Normal rate and regular rhythm.     Heart sounds: Normal heart sounds.  Pulmonary:     Effort: Pulmonary effort is normal.     Breath sounds: Normal breath sounds.  Abdominal:     General: Bowel sounds are normal.     Palpations: Abdomen is soft.  Musculoskeletal:     Cervical back: Normal range of motion and neck supple.     Right lower leg: No edema.     Left lower leg: No edema.  Skin:    General: Skin is warm and dry.  Neurological:     Mental Status: She is alert.  Psychiatric:        Mood and Affect: Mood normal.    Labs reviewed: Basic Metabolic Panel: No results for input(s): NA, K, CL, CO2, GLUCOSE, BUN,  CREATININE, CALCIUM, MG, PHOS, TSH in the last 8760 hours. Liver Function Tests: No results for input(s): AST, ALT, ALKPHOS, BILITOT, PROT, ALBUMIN in the last 8760 hours. No results for input(s): LIPASE, AMYLASE in the last 8760 hours. No results for input(s): AMMONIA in  the last 8760 hours. CBC: No results for input(s): WBC, NEUTROABS, HGB, HCT, MCV, PLT in the last 8760 hours. Lipid Panel: No results for input(s): CHOL, HDL, LDLCALC, TRIG, CHOLHDL, LDLDIRECT in the last 8760 hours. TSH: No results for input(s): TSH in the last 8760 hours. A1C: No results found for: HGBA1C   Assessment/Plan 1. Need for influenza vaccination - Flu Vaccine QUAD High Dose(Fluad)  2. Family history of colon cancer - Ambulatory referral to Gastroenterology  3. Encounter for colorectal cancer screening - Ambulatory referral to Gastroenterology  4. Primary osteoarthritis involving multiple joints -ongoing uses tylenol with some relief. Can take tylenol 500 mg 1-2 tablets every 8 hours as needed for pain.  - AMB referral to orthopedics  5. Class 1 obesity due to excess calories with serious comorbidity and body mass index (BMI) of 32.0 to 32.9 in adult --education provided on healthy weight loss through increase in physical activity and proper nutrition   6. Mixed hyperlipidemia -continues on crestor. LDL 88 in July  7. Primary hypertension --stable. Goal bp <140/90. Continue on current regimen with low sodium diet.   8. Hyperglycemia - Hemoglobin A1c 6.4 In June. Continue dietary modifications with increase in physical activity.  9. Vitamin D deficiency -to start vit d 2000 units daily  10. Estrogen deficiency - DG Bone Density; Future  11. Screening mammogram for breast cancer - MM Digital Screening; Future    Next appt: 3 months, AWV in 6 weeks.  Janene Harvey. Biagio Borg  [facility] [phone]

## 2021-03-09 NOTE — Patient Instructions (Addendum)
Tylenol (acetaminophen) 500 mg 2 tablets every 8 hours as needed   Vit d 2000 units daily - Low vit D level  AWV in 6 weeks. In office   Increase physical activity, 30 mins 5 days a week

## 2021-03-16 ENCOUNTER — Ambulatory Visit: Payer: Commercial Managed Care - PPO | Admitting: Nurse Practitioner

## 2021-03-24 ENCOUNTER — Other Ambulatory Visit: Payer: Self-pay

## 2021-03-24 ENCOUNTER — Ambulatory Visit (INDEPENDENT_AMBULATORY_CARE_PROVIDER_SITE_OTHER): Payer: Medicare HMO

## 2021-03-24 ENCOUNTER — Ambulatory Visit (INDEPENDENT_AMBULATORY_CARE_PROVIDER_SITE_OTHER): Payer: Medicare HMO | Admitting: Orthopaedic Surgery

## 2021-03-24 VITALS — Ht 59.5 in | Wt 172.0 lb

## 2021-03-24 DIAGNOSIS — M1611 Unilateral primary osteoarthritis, right hip: Secondary | ICD-10-CM

## 2021-03-24 NOTE — Progress Notes (Signed)
Office Visit Note   Patient: [name]           Date of Birth: 1955-10-22           MRN: [id number] Visit Date: 03/24/2021              Requested by: [name], NP [address] [place]. [place],  [place] [address] PCP: [name], NP   Assessment & Plan: Visit Diagnoses:  1. Primary osteoarthritis of right hip     Plan: Impression is end-stage right hip DJD.  I reviewed the x-rays with [name] in detail today.  Based on treatment options she is elected to move forward with a right total hip replacement in the near future.  Anticipate out of work 2 to 3 months.  She works third shift at the [employer].  She will continue to use the cane for ambulation.  Over-the-counter medications as needed.  We had a discussion on risk benefits rehab recovery as well.  All questions answered.  Follow-Up Instructions: No follow-ups on file.   Orders:  Orders Placed This Encounter  Procedures   XR HIP UNILAT W OR W/O PELVIS 2-3 VIEWS RIGHT   No orders of the defined types were placed in this encounter.     Procedures: No procedures performed   Clinical Data: No additional findings.   Subjective: Chief Complaint  Patient presents with   Right Hip - Pain    [name] is a 65 year old female here for evaluation of chronic right hip and groin pain that has been severe in the last year.  She has had prior cortisone injection that was done in [place] about 7 to 8 months ago which only lasted about a week.  Denies any back or radicular symptoms.  She has been ambulating with a cane during this time.  Over-the-counter medications do not help.  Physician directed home exercise program did not help either.   Review of Systems  Constitutional: Negative.   HENT: Negative.    Eyes: Negative.   Respiratory: Negative.    Cardiovascular: Negative.   Endocrine: Negative.   Musculoskeletal: Negative.   Neurological: Negative.   Hematological: Negative.    Psychiatric/Behavioral: Negative.    All other systems reviewed and are negative.   Objective: Vital Signs: Ht 4' 11.5" (1.511 m)   Wt 172 lb (78 kg)   BMI 34.16 kg/m   Physical Exam Vitals and nursing note reviewed.  Constitutional:      Appearance: She is well-developed.  Pulmonary:     Effort: Pulmonary effort is normal.  Skin:    General: Skin is warm.     Capillary Refill: Capillary refill takes less than 2 seconds.  Neurological:     Mental Status: She is alert and oriented to person, place, and time.  Psychiatric:        Behavior: Behavior normal.        Thought Content: Thought content normal.        Judgment: Judgment normal.    Ortho Exam  Right hip shows very limited range of motion with severe catching pain.  Antalgic gait.  No sciatic tension signs.  Positive Stinchfield sign.  Positive logroll.  Specialty Comments:  No specialty comments available.  Imaging: XR HIP UNILAT W OR W/O PELVIS 2-3 VIEWS RIGHT  Result Date: 03/24/2021 Advanced right hip DJD.  Bone-on-bone joint space narrowing    PMFS History: Patient Active Problem List   Diagnosis Date Noted   Primary osteoarthritis  of right hip 03/24/2021   TRIGGER FINGER, LEFT THUMB 08/12/2009   ANXIETY 05/20/2009   CANDIDIASIS, SKIN 03/12/2009   TINGLING 12/24/2008   PHARYNGITIS 08/31/2008   UPPER RESPIRATORY INFECTION 08/31/2008   CHEST PAIN 02/03/2008   HYPOKALEMIA 11/07/2007   Other specified disorders of bladder 09/26/2007   GASTRITIS 08/07/2007   HEADACHE 08/07/2007   CONSTIPATION 07/23/2007   MICROSCOPIC HEMATURIA 07/23/2007   DENTAL CARIES 05/27/2007   TINEA PEDIS 05/23/2007   ALLERGIC RHINITIS 05/23/2007   GERD 05/23/2007   HIATAL HERNIA 05/23/2007   DEGENERATIVE JOINT DISEASE, LEFT KNEE 05/23/2007   ESSENTIAL HYPERTENSION, BENIGN 05/20/2007   Past Medical History:  Diagnosis Date   Acid reflux    [facility]   Alcohol use    Per  Records received from [facility]   Arthritis    Knee and hip   BMI 36.0-36.9,adult    Per Records received from [facility]   Chronic knee pain    L & R   GERD (gastroesophageal reflux disease)    Heart murmur, systolic    Per Records received from [facility]   Hyperlipidemia    Hypertension    Morbid obesity (HCC)    Per Records received from [facility]   Other osteoarthritis involving multiple joints    Per Records received from [employer]   Prediabetes    Per Records received from [facility]   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[facility])    Per records from [facility] [place]   Right hip pain    Per records from [facility]   Right thigh pain 2022   Per records from [facility]   Vitamin D deficiency    Per Records received from [facility]    Family History  Problem Relation Age of Onset   Hypertension Mother        Per records from [facility] [place]   Cancer Mother 44       colon   Colon cancer Mother        Per records from [facility] [place]   Cancer Father 72       lung   Hypertension Father        Per records from [facility] [place]   Lung disease Father        Per records from [facility]   Seizures Brother    Epilepsy Brother    Drug abuse Daughter    Other Daughter        Murder in 2014    Past Surgical History:  Procedure Laterality Date   JOINT REPLACEMENT Left 07/11/2018   knee replacement   TOE SURGERY     TUBAL LIGATION  1978   [facility]   Social History   Occupational History   Not on file  Tobacco Use   Smoking status: Never   Smokeless tobacco: Never  Vaping Use   Vaping Use: Never used  Substance and Sexual Activity   Alcohol use: No   Drug use: No   Sexual activity: Not on file

## 2021-03-28 ENCOUNTER — Telehealth: Payer: Self-pay | Admitting: Orthopaedic Surgery

## 2021-03-28 DIAGNOSIS — M1611 Unilateral primary osteoarthritis, right hip: Secondary | ICD-10-CM

## 2021-03-28 NOTE — Telephone Encounter (Signed)
Is she interested in getting a cortisone shot in her hip?

## 2021-03-28 NOTE — Telephone Encounter (Signed)
Patient called. She would like some pain medication called in to [facility] - [place] ([place]), [place] - [address] her call back number is [phone]

## 2021-03-28 NOTE — Telephone Encounter (Signed)
Pt called and states she seen Dr.Xu and she did not want to get surgery done. She said Roda Shutters told her to go see Dr.Duda but she is in a lot of pain and she's trying to figure out what she needs to do? And also wondering if she could get some type of medication for the pain.   CB [phone]

## 2021-03-29 ENCOUNTER — Other Ambulatory Visit: Payer: Self-pay | Admitting: Physician Assistant

## 2021-03-29 MED ORDER — TRAMADOL HCL 50 MG PO TABS: 50.0000 mg | ORAL_TABLET | Freq: Three times a day (TID) | ORAL | 2 refills | Status: DC | PRN

## 2021-03-29 NOTE — Telephone Encounter (Signed)
Sent in

## 2021-03-29 NOTE — Telephone Encounter (Signed)
Yes would like hip inj.  States Tramadol does not help at all([name] sent into pharm today)

## 2021-03-29 NOTE — Telephone Encounter (Signed)
Ok let's get her to see Alvester Morin for the injection

## 2021-03-30 NOTE — Addendum Note (Signed)
Addended by: Rogers Seeds on: 03/30/2021 02:49 PM   Modules accepted: Orders

## 2021-03-30 NOTE — Telephone Encounter (Signed)
Referral entered for hip injection with Dr. Alvester Morin. I left voicemail for patient advising.

## 2021-04-11 ENCOUNTER — Other Ambulatory Visit: Payer: Self-pay

## 2021-04-11 ENCOUNTER — Ambulatory Visit: Payer: Self-pay

## 2021-04-11 ENCOUNTER — Telehealth: Payer: Self-pay | Admitting: Orthopaedic Surgery

## 2021-04-11 ENCOUNTER — Encounter: Payer: Self-pay | Admitting: Physical Medicine and Rehabilitation

## 2021-04-11 ENCOUNTER — Ambulatory Visit (INDEPENDENT_AMBULATORY_CARE_PROVIDER_SITE_OTHER): Payer: Medicare HMO | Admitting: Physical Medicine and Rehabilitation

## 2021-04-11 DIAGNOSIS — M25551 Pain in right hip: Secondary | ICD-10-CM | POA: Diagnosis not present

## 2021-04-11 NOTE — Progress Notes (Signed)
Pt state right hip pain. Pt state sitting and trying to get up makes the pain worse. Pt state she takes over the counter pain meds to help ease her pain.   Numeric Pain Rating Scale and Functional Assessment Average Pain 7   In the last MONTH (on 0-10 scale) has pain interfered with the following?  1. General activity like being  able to carry out your everyday physical activities such as walking, climbing stairs, carrying groceries, or moving a chair?  Rating(10)   -BT, -Dye Allergies.

## 2021-04-11 NOTE — Progress Notes (Signed)
   Clarisse Rodriges - 65 y.o. female MRN [id number]  Date of birth: 03/15/1956  Office Visit Note: Visit Date: 04/11/2021 PCP: [name], NP Referred by: [name], NP  Subjective: Chief Complaint  Patient presents with   Right Hip - Pain   HPI:  Karlina Suares is a 65 y.o. female who comes in today at the request of Dr. Glee Arvin for planned Right anesthetic hip arthrogram with fluoroscopic guidance.  The patient has failed conservative care including home exercise, medications, time and activity modification.  This injection will be diagnostic and hopefully therapeutic.  Please see requesting physician notes for further details and justification.   ROS Otherwise per HPI.  Assessment & Plan: Visit Diagnoses:    ICD-10-CM   1. Pain in right hip  M25.551 XR C-ARM NO REPORT    Large Joint Inj: R hip joint      Plan: No additional findings.   Meds & Orders: No orders of the defined types were placed in this encounter.   Orders Placed This Encounter  Procedures   Large Joint Inj: R hip joint   XR C-ARM NO REPORT    Follow-up: Return for visit to requesting physician as needed.   Procedures: Large Joint Inj: R hip joint on 04/11/2021 2:51 PM Indications: diagnostic evaluation and pain Details: 22 G 3.5 in needle, fluoroscopy-guided anterior approach  Arthrogram: No  Medications: 4 mL bupivacaine 0.25 %; 60 mg triamcinolone acetonide 40 MG/ML Outcome: tolerated well, no immediate complications  There was excellent flow of contrast producing a partial arthrogram of the hip. The patient did have relief of symptoms during the anesthetic phase of the injection. Procedure, treatment alternatives, risks and benefits explained, specific risks discussed. Consent was given by the patient. Immediately prior to procedure a time out was called to verify the correct patient, procedure, equipment, support staff and site/side marked as required. Patient was prepped and draped in the  usual sterile fashion.         Clinical History: No specialty comments available.     Objective:  VS:  HT:    WT:   BMI:     BP:   HR: bpm  TEMP: ( )  RESP:  Physical Exam   Imaging: No results found.

## 2021-04-11 NOTE — Telephone Encounter (Signed)
Pt called stating she has arthritis in her hip and she would like to have something other than tramadol called in. Pt states the tylenol she takes over the counter doesn't help. Pt would like a CB to be updated on what is sent in please.   [phone]

## 2021-04-12 NOTE — Telephone Encounter (Signed)
Happy to try tylenol 3 if she would like, but if still needs something stronger, she will need to get from pcp or we can refer to pain management

## 2021-04-14 ENCOUNTER — Other Ambulatory Visit: Payer: Self-pay | Admitting: Physician Assistant

## 2021-04-14 MED ORDER — ACETAMINOPHEN-CODEINE #3 300-30 MG PO TABS: 1.0000 | ORAL_TABLET | Freq: Three times a day (TID) | ORAL | 1 refills | Status: DC | PRN

## 2021-04-14 NOTE — Telephone Encounter (Signed)
Would like Tylenol #3. Uses [facility].

## 2021-04-14 NOTE — Telephone Encounter (Signed)
Sent in

## 2021-04-17 MED ORDER — TRIAMCINOLONE ACETONIDE 40 MG/ML IJ SUSP
60.0000 mg | INTRAMUSCULAR | Status: AC | PRN
  Administered 2021-04-11: 60 mg via INTRA_ARTICULAR

## 2021-04-17 MED ORDER — BUPIVACAINE HCL 0.25 % IJ SOLN
4.0000 mL | INTRAMUSCULAR | Status: AC | PRN
  Administered 2021-04-11: 4 mL via INTRA_ARTICULAR

## 2021-04-22 DIAGNOSIS — M7060 Trochanteric bursitis, unspecified hip: Secondary | ICD-10-CM | POA: Diagnosis not present

## 2021-04-22 DIAGNOSIS — R030 Elevated blood-pressure reading, without diagnosis of hypertension: Secondary | ICD-10-CM | POA: Diagnosis not present

## 2021-04-22 DIAGNOSIS — I10 Essential (primary) hypertension: Secondary | ICD-10-CM | POA: Diagnosis not present

## 2021-04-22 DIAGNOSIS — M25559 Pain in unspecified hip: Secondary | ICD-10-CM | POA: Diagnosis not present

## 2021-04-25 ENCOUNTER — Encounter: Payer: Self-pay | Admitting: Nurse Practitioner

## 2021-04-25 ENCOUNTER — Other Ambulatory Visit: Payer: Self-pay

## 2021-04-25 ENCOUNTER — Ambulatory Visit (INDEPENDENT_AMBULATORY_CARE_PROVIDER_SITE_OTHER): Payer: Medicare HMO | Admitting: Nurse Practitioner

## 2021-04-25 VITALS — BP 128/80 | HR 68 | Temp 97.1°F | Ht 59.5 in | Wt 173.0 lb

## 2021-04-25 DIAGNOSIS — Z23 Encounter for immunization: Secondary | ICD-10-CM | POA: Diagnosis not present

## 2021-04-25 DIAGNOSIS — Z0000 Encounter for general adult medical examination without abnormal findings: Secondary | ICD-10-CM

## 2021-04-25 NOTE — Progress Notes (Signed)
Subjective:    [name] is a 65 y.o. female who presents for a Welcome to Medicare exam.   Review of Systems  Cardiac Risk Factors include: advanced age (>35men, >62 women);hypertension;obesity (BMI >30kg/m2);dyslipidemia      Objective:    Today's Vitals   04/25/21 0902  BP: 128/80  Pulse: 68  Temp: (!) 97.1 F (36.2 C)  TempSrc: Temporal  SpO2: 98%  Weight: 173 lb (78.5 kg)  Height: 4' 11.5" (1.511 m)  Body mass index is 34.36 kg/m.  Medications Outpatient Encounter Medications as of 04/25/2021  Medication Sig   acetaminophen-codeine (TYLENOL #3) 300-30 MG tablet Take 1 tablet by mouth every 8 (eight) hours as needed for moderate pain.   lisinopril-hydrochlorothiazide (ZESTORETIC) 20-12.5 MG tablet Take 1 tablet by mouth daily.   rosuvastatin (CRESTOR) 5 MG tablet Take 5 mg by mouth at bedtime.   Acetaminophen (TYLENOL ARTHRITIS PAIN PO) Take 650 mg by mouth. Take 1-2 tablets daily (Patient not taking: Reported on 04/25/2021)   [DISCONTINUED] traMADol (ULTRAM) 50 MG tablet Take 1 tablet (50 mg total) by mouth 3 (three) times daily as needed. (Patient not taking: Reported on 04/25/2021)   No facility-administered encounter medications on file as of 04/25/2021.     History: Past Medical History:  Diagnosis Date   Acid reflux    [facility]   Alcohol use    Per Records received from [facility]   Arthritis    Knee and hip   BMI 36.0-36.9,adult    Per Records received from [facility]   Chronic knee pain    L & R   GERD (gastroesophageal reflux disease)    Heart murmur, systolic    Per Records received from [facility]   Hyperlipidemia    Hypertension    Morbid obesity (HCC)    Per Records received from [facility]   Other osteoarthritis involving multiple joints    Per Records received from [employer]   Prediabetes    Per Records received from [facility]   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[facility])    Per records from [employer] [facility] the [facility]   Right hip pain    Per records from [employer] [facility]   Right thigh pain 2022   Per records from [employer] [facility] the [facility]   Vitamin D deficiency    Per Records received from [facility]   Past Surgical History:  Procedure Laterality Date   JOINT REPLACEMENT Left 07/11/2018   knee replacement   TOE SURGERY     TUBAL LIGATION  1978   [facility]    Family History  Problem Relation Age of Onset   Hypertension Mother        [facility] records [facility] [place]   Cancer Mother 74       colon   Colon cancer Mother        [facility] [place]   Cancer Father 72       lung   Hypertension Father        Per records from [employer] [facility] the [place]   Lung disease Father        Per records from [employer] [facility] the [facility]   Seizures Brother    Epilepsy Brother    Drug abuse Daughter    Other Daughter  Murder in 2014   Social History   Occupational History   Not on file  Tobacco Use   Smoking status: Never   Smokeless tobacco: Never  Vaping Use   Vaping Use: Never used  Substance and Sexual Activity   Alcohol use: No   Drug use: No   Sexual activity: Not on file    Tobacco Counseling Counseling given: Not Answered   Immunizations and Health Maintenance Immunization History  Administered Date(s) Administered   Fluad Quad(high Dose 65+) 03/09/2021   Influenza Whole 04/19/2009   Influenza,inj,Quad PF,6+ Mos 05/17/2018   Moderna SARS-COV2 Booster Vaccination 01/14/2021   Moderna Sars-Covid-2 Vaccination 04/16/2020, 05/14/2020   PNEUMOCOCCAL CONJUGATE-20 04/25/2021   Td 08/10/2006   Health Maintenance Due  Topic Date Due   Zoster Vaccines- Shingrix (1 of 2) Never done   MAMMOGRAM  08/02/2012   TETANUS/TDAP  08/10/2016   DEXA SCAN  Never done    Activities of Daily  Living In your present state of health, do you have any difficulty performing the following activities: 04/25/2021  Hearing? N  Vision? N  Difficulty concentrating or making decisions? N  Walking or climbing stairs? N  Dressing or bathing? N  Doing errands, shopping? N  Preparing Food and eating ? N  Using the Toilet? N  In the past six months, have you accidently leaked urine? N  Do you have problems with loss of bowel control? N  Managing your Medications? N  Managing your Finances? N  Housekeeping or managing your Housekeeping? N  Some recent data might be hidden     Advanced Directives: Does Patient Have a Medical Advance Directive?: No Does patient want to make changes to medical advance directive?: No - Patient declined    Assessment:    This is a routine wellness examination for this patient .   Vision/Hearing screen Hearing Screening - Comments:: No hearing issues Vision Screening - Comments:: Last eye exam, less 12 months ago. Need referral to eye doctor in [place]   Dietary issues and exercise activities discussed:  Current Exercise Habits: The patient does not participate in regular exercise at present   Goals   None    Depression Screen PHQ 2/9 Scores 04/25/2021  PHQ - 2 Score 0     Fall Risk Fall Risk  04/25/2021  Falls in the past year? 0  Number falls in past yr: 0  Injury with Fall? 0  Risk for fall due to : No Fall Risks  Follow up Falls evaluation completed    Cognitive Function: MMSE - Mini Mental State Exam 04/25/2021  Orientation to time 5  Orientation to Place 5  Registration 3  Attention/ Calculation 5  Recall 2  Recall-comments missed penny  Language- name 2 objects 2  Language- repeat 1  Language- follow 3 step command 2  Language- follow 3 step command-comments took paper with left hand  Language- read & follow direction 1  Write a sentence 1  Copy design 1  Total score 28        Patient Care Team: [name], NP as PCP - General (Geriatric Medicine)     Plan:     I have personally reviewed and noted the following in the patient's chart:   Medical and social history Use of alcohol, tobacco or illicit drugs  Current medications and supplements Functional ability and status Nutritional status Physical activity Advanced directives List of other physicians Hospitalizations, surgeries, and ER visits in previous 12 months Vitals  Screenings to include cognitive, depression, and falls Referrals and appointments  In addition, I have reviewed and discussed with patient certain preventive protocols, quality metrics, and best practice recommendations. A written personalized care plan for preventive services as well as general preventive health recommendations were provided to patient.     [name], NP 04/25/2021

## 2021-04-25 NOTE — Patient Instructions (Signed)
[name] , Thank you for taking time to come for your Medicare Wellness Visit. I appreciate your ongoing commitment to your health goals. Please review the following plan we discussed and let me know if I can assist you in the future.   Screening recommendations/referrals: Colonoscopy -referral was sent- call [phone] to schedule appt  Mammogram scheduled in Feb Bone Density scheduled in feb Recommended yearly ophthalmology/optometry visit for glaucoma screening and checkup Recommended yearly dental visit for hygiene and checkup  Vaccinations: Influenza vaccine up to date Pneumococcal vaccine given today  Tdap vaccine RECOMMENDED- to get at your local pharmacy Shingles vaccine RECOMMENDED- to get at your local pharmacy COVID booster- RECOMMENDED to get at your local pharmacy    Advanced directives: recommend to complete and bring back to office.   Conditions/risks identified: obesity, advanced age, hyperlipidemia, hypertension  Next appointment: yearly for AWV   Preventive Care 52 Years and Older, Female Preventive care refers to lifestyle choices and visits with your health care provider that can promote health and wellness. What does preventive care include? A yearly physical exam. This is also called an annual well check. Dental exams once or twice a year. Routine eye exams. Ask your health care provider how often you should have your eyes checked. Personal lifestyle choices, including: Daily care of your teeth and gums. Regular physical activity. Eating a healthy diet. Avoiding tobacco and drug use. Limiting alcohol use. Practicing safe sex. Taking low-dose aspirin every day. Taking vitamin and mineral supplements as recommended by your health care provider. What happens during an annual well check? The services and screenings done by your health care provider during your annual well check will depend on your age, overall health, lifestyle risk factors, and family  history of disease. Counseling  Your health care provider may ask you questions about your: Alcohol use. Tobacco use. Drug use. Emotional well-being. Home and relationship well-being. Sexual activity. Eating habits. History of falls. Memory and ability to understand (cognition). Work and work Astronomer. Reproductive health. Screening  You may have the following tests or measurements: Height, weight, and BMI. Blood pressure. Lipid and cholesterol levels. These may be checked every 5 years, or more frequently if you are over 60 years old. Skin check. Lung cancer screening. You may have this screening every year starting at age 11 if you have a 30-pack-year history of smoking and currently smoke or have quit within the past 15 years. Fecal occult blood test (FOBT) of the stool. You may have this test every year starting at age 64. Flexible sigmoidoscopy or colonoscopy. You may have a sigmoidoscopy every 5 years or a colonoscopy every 10 years starting at age 64. Hepatitis C blood test. Hepatitis B blood test. Sexually transmitted disease (STD) testing. Diabetes screening. This is done by checking your blood sugar (glucose) after you have not eaten for a while (fasting). You may have this done every 1-3 years. Bone density scan. This is done to screen for osteoporosis. You may have this done starting at age 63. Mammogram. This may be done every 1-2 years. Talk to your health care provider about how often you should have regular mammograms. Talk with your health care provider about your test results, treatment options, and if necessary, the need for more tests. Vaccines  Your health care provider may recommend certain vaccines, such as: Influenza vaccine. This is recommended every year. Tetanus, diphtheria, and acellular pertussis (Tdap, Td) vaccine. You may need a Td booster every 10 years. Zoster vaccine. You may  need this after age 26. Pneumococcal 13-valent conjugate (PCV13)  vaccine. One dose is recommended after age 81. Pneumococcal polysaccharide (PPSV23) vaccine. One dose is recommended after age 63. Talk to your health care provider about which screenings and vaccines you need and how often you need them. This information is not intended to replace advice given to you by your health care provider. Make sure you discuss any questions you have with your health care provider. Document Released: 07/23/2015 Document Revised: 03/15/2016 Document Reviewed: 04/27/2015 Elsevier Interactive Patient Education  2017 [place] Prevention in the Home Falls can cause injuries. They can happen to people of all ages. There are many things you can do to make your home safe and to help prevent falls. What can I do on the outside of my home? Regularly fix the edges of walkways and driveways and fix any cracks. Remove anything that might make you trip as you walk through a door, such as a raised step or threshold. Trim any bushes or trees on the path to your home. Use bright outdoor lighting. Clear any walking paths of anything that might make someone trip, such as rocks or tools. Regularly check to see if handrails are loose or broken. Make sure that both sides of any steps have handrails. Any raised decks and porches should have guardrails on the edges. Have any leaves, snow, or ice cleared regularly. Use sand or salt on walking paths during winter. Clean up any spills in your garage right away. This includes oil or grease spills. What can I do in the bathroom? Use night lights. Install grab bars by the toilet and in the tub and shower. Do not use towel bars as grab bars. Use non-skid mats or decals in the tub or shower. If you need to sit down in the shower, use a plastic, non-slip stool. Keep the floor dry. Clean up any water that spills on the floor as soon as it happens. Remove soap buildup in the tub or shower regularly. Attach bath mats securely with  double-sided non-slip rug tape. Do not have throw rugs and other things on the floor that can make you trip. What can I do in the bedroom? Use night lights. Make sure that you have a light by your bed that is easy to reach. Do not use any sheets or blankets that are too big for your bed. They should not hang down onto the floor. Have a firm chair that has side arms. You can use this for support while you get dressed. Do not have throw rugs and other things on the floor that can make you trip. What can I do in the kitchen? Clean up any spills right away. Avoid walking on wet floors. Keep items that you use a lot in easy-to-reach places. If you need to reach something above you, use a strong step stool that has a grab bar. Keep electrical cords out of the way. Do not use floor polish or wax that makes floors slippery. If you must use wax, use non-skid floor wax. Do not have throw rugs and other things on the floor that can make you trip. What can I do with my stairs? Do not leave any items on the stairs. Make sure that there are handrails on both sides of the stairs and use them. Fix handrails that are broken or loose. Make sure that handrails are as long as the stairways. Check any carpeting to make sure that it is firmly attached  to the stairs. Fix any carpet that is loose or worn. Avoid having throw rugs at the top or bottom of the stairs. If you do have throw rugs, attach them to the floor with carpet tape. Make sure that you have a light switch at the top of the stairs and the bottom of the stairs. If you do not have them, ask someone to add them for you. What else can I do to help prevent falls? Wear shoes that: Do not have high heels. Have rubber bottoms. Are comfortable and fit you well. Are closed at the toe. Do not wear sandals. If you use a stepladder: Make sure that it is fully opened. Do not climb a closed stepladder. Make sure that both sides of the stepladder are locked  into place. Ask someone to hold it for you, if possible. Clearly mark and make sure that you can see: Any grab bars or handrails. First and last steps. Where the edge of each step is. Use tools that help you move around (mobility aids) if they are needed. These include: Canes. Walkers. Scooters. Crutches. Turn on the lights when you go into a dark area. Replace any light bulbs as soon as they burn out. Set up your furniture so you have a clear path. Avoid moving your furniture around. If any of your floors are uneven, fix them. If there are any pets around you, be aware of where they are. Review your medicines with your doctor. Some medicines can make you feel dizzy. This can increase your chance of falling. Ask your doctor what other things that you can do to help prevent falls. This information is not intended to replace advice given to you by your health care provider. Make sure you discuss any questions you have with your health care provider. Document Released: 04/22/2009 Document Revised: 12/02/2015 Document Reviewed: 07/31/2014 Elsevier Interactive Patient Education  2017 [employer].

## 2021-05-11 ENCOUNTER — Telehealth: Payer: Self-pay | Admitting: *Deleted

## 2021-05-11 NOTE — Telephone Encounter (Signed)
Patient called and left message on Clinical intake wanting to know if Shanda Bumps would sign her Handicap placard.   Patient called back and spoke with someone and they told her to drop it off for [facility] for Friday.

## 2021-05-27 ENCOUNTER — Ambulatory Visit (INDEPENDENT_AMBULATORY_CARE_PROVIDER_SITE_OTHER): Payer: Medicare HMO | Admitting: Nurse Practitioner

## 2021-05-27 ENCOUNTER — Other Ambulatory Visit: Payer: Self-pay

## 2021-05-27 ENCOUNTER — Encounter: Payer: Self-pay | Admitting: Nurse Practitioner

## 2021-05-27 VITALS — BP 140/80 | HR 65 | Temp 97.0°F | Ht 59.5 in | Wt 170.4 lb

## 2021-05-27 DIAGNOSIS — Z6832 Body mass index (BMI) 32.0-32.9, adult: Secondary | ICD-10-CM | POA: Diagnosis not present

## 2021-05-27 DIAGNOSIS — M1611 Unilateral primary osteoarthritis, right hip: Secondary | ICD-10-CM

## 2021-05-27 DIAGNOSIS — E782 Mixed hyperlipidemia: Secondary | ICD-10-CM

## 2021-05-27 DIAGNOSIS — R739 Hyperglycemia, unspecified: Secondary | ICD-10-CM

## 2021-05-27 DIAGNOSIS — E6609 Other obesity due to excess calories: Secondary | ICD-10-CM

## 2021-05-27 DIAGNOSIS — I10 Essential (primary) hypertension: Secondary | ICD-10-CM

## 2021-05-27 DIAGNOSIS — Z1231 Encounter for screening mammogram for malignant neoplasm of breast: Secondary | ICD-10-CM

## 2021-05-27 NOTE — Progress Notes (Signed)
Careteam: Patient Care Team: Lauree Chandler, NP as PCP - General (Geriatric Medicine)  PLACE OF SERVICE:  [place] Directive information Does Patient Have a Medical Advance Directive?: No  Allergies  Allergen Reactions   Aspirin Other (See Comments)    Acid reflux   Ibuprofen Other (See Comments)    Acid reflux     Chief Complaint  Patient presents with   Medical Management of Chronic Issues    3 month follow up.   Health Maintenance    Zoster vaccine, Tetanus/tdap, COVID booster     HPI: Patient is a 65 y.o. female here for routine follow up.   Last A1c 6/4. Does not check blood sugar at home.    Osteoarthritis- knee replacement on the left completed in [place] 2 years ago. Reports Right sided hip pain takes OTC tylenol and has tried OTC voltaren gel with minimal relief. Has prescription for tylenol #3. Last seen by orthopedics in October, received a steroid shot and had some relief. Requesting a cane to help with ambulation and completion of  handicap form. She reports that pain worsens with ambulation. The plan is for possible surgery in January.   HTN- BP 140/80 today. Reports that she did not take BP medication this morning.  Continue current medication   HLD- continues Crestor daily  Mammogram/ Dexa - Has not completed bit has scheduled.   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 Also walks dog daily.    Review of Systems:  Review of Systems  Constitutional:  Negative for diaphoresis, fever, malaise/fatigue and weight loss.  HENT:  Negative for hearing loss and tinnitus.   Eyes:  Negative for pain and discharge.  Respiratory:  Negative for cough, shortness of breath and wheezing.   Cardiovascular:  Negative for chest pain, palpitations and leg swelling.  Gastrointestinal:  Negative for abdominal pain, blood in stool, constipation, heartburn and nausea.  Genitourinary:  Negative for dysuria and hematuria.  Musculoskeletal:  Positive  for joint pain. Negative for falls.  Skin:  Negative for rash.  Neurological:  Negative for dizziness, weakness and headaches.  Psychiatric/Behavioral:  The patient is not nervous/anxious.    Past Medical History:  Diagnosis Date   Acid reflux    Per records from [place]   Alcohol use    Per Records received from [place] [facility]   BMI 36.0-36.9,adult    Per Records received from [facility]   Chronic knee pain    L & R   GERD (gastroesophageal reflux disease)    Heart murmur, systolic    Per Records received from [facility]   Hyperlipidemia    Hypertension    Morbid obesity ([place])    Per Records received from [facility]   Other osteoarthritis involving multiple joints    Per Records received from [employer]   Prediabetes    Per Records received from [facility]   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 She has never used smokeless tobacco. She reports that she does not drink alcohol and does not use drugs.  Family History  Problem Relation Age of Onset   Hypertension Mother        Per records from [place] Mother 63       colon   Colon cancer Mother        Per records from [place] Father 72       lung   Hypertension Father        Per records from [place] disease Father         Per records from [facility]   Seizures Brother    Epilepsy Brother    Drug abuse Daughter    Other Daughter        Murder in 2014    Medications: Patient's Medications  New Prescriptions   No medications on file  Previous Medications   ACETAMINOPHEN (TYLENOL ARTHRITIS PAIN PO)    Take 650 mg by mouth. Take 1-2 tablets daily   ACETAMINOPHEN-CODEINE (TYLENOL #3) 300-30 MG TABLET    Take 1 tablet by mouth every 8 (eight) hours as needed for moderate pain.   LISINOPRIL-HYDROCHLOROTHIAZIDE (ZESTORETIC) 20-12.5 MG TABLET    Take 1 tablet by mouth daily.   ROSUVASTATIN (CRESTOR) 5 MG TABLET    Take 5 mg by mouth at bedtime.   VITAMIN D PO    Take by mouth.  Modified Medications   No medications on file  Discontinued Medications   No medications on file    Physical Exam:  Vitals:   05/27/21 0947  BP: 140/80  Pulse: 65  Temp: (!) 97 F (36.1 C)  SpO2: 98%  Weight: 170 lb 6.4 oz (77.3 kg)  Height: 4' 11.5" (1.511 m)   Body mass index is 33.84 kg/m. Wt Readings from Last 3 Encounters:  05/27/21 170 lb 6.4 oz (77.3 kg)  04/25/21 173 lb (78.5 kg)  03/24/21 172 lb (78 kg)    Physical Exam Constitutional:      Appearance: Normal appearance.  HENT:     Head: Normocephalic and atraumatic.     Mouth/Throat:     Mouth: Mucous membranes are moist.  Eyes:     Pupils: Pupils are equal, round, and reactive to light.  Cardiovascular:     Rate and Rhythm: Normal rate and regular rhythm.  Pulmonary:     Effort: Pulmonary effort is normal.  Musculoskeletal:     Cervical back: Normal range of motion and neck supple.     Right lower leg: No edema.     Left lower leg: Edema present.  Skin:    General: Skin is warm and dry.  Neurological:     General: No focal deficit present.     Mental Status: She is alert and oriented to person, place, and time.  Psychiatric:        Mood and Affect: Mood normal.        Behavior: Behavior normal.    Labs  reviewed: Basic Metabolic Panel: Recent Labs    01/14/21 0000  NA 140  K 4.1  CL 100  CO2 29*  BUN  19  CREATININE 0.8  CALCIUM 10.0  TSH 0.65   Liver Function Tests: Recent Labs    01/14/21 0000  AST 19  ALT 21  ALKPHOS 60  ALBUMIN 4.2   No results for input(s): LIPASE, AMYLASE in the last 8760 hours. No results for input(s): AMMONIA in the last 8760 hours. CBC: Recent Labs    01/14/21 0000  WBC 7.6  NEUTROABS 3,929.00  HGB 13.4  HCT 40  PLT 304   Lipid Panel: Recent Labs    01/14/21 0000  CHOL 167  HDL 52  LDLCALC 88  TRIG 173*   TSH: Recent Labs    01/14/21 0000  TSH 0.65   A1C: Lab Results  Component Value Date   HGBA1C 6.4 01/14/2021     Assessment/Plan 1. Class 1 obesity due to excess calories with serious comorbidity and body mass index (BMI) of 32.0 to 32.9 in adult Has lost weight, congratulated on this! Continue to remain active and dietary modifications.    2. Mixed hyperlipidemia Continue crestor. Education provided on heart healthy diet - CMP with eGFR([facility])  3. Hyperglycemia Last Hgb A1c 6.4 in July. Continue to increase physical activity and diet modifications. Education given on diabetic diet.  - Hemoglobin A1c - CMP with eGFR([facility])  4. Primary hypertension Stable. Did not take blood pressure medication today BP goal < 140/90. Continue Lisinopril-HCTZ and low sodium diet  5. Primary osteoarthritis of right hip Ongoing, Plan for possible surgery in January. Orthopedic following. Handicap form completed for 6 months. Discussed the option to purchase cane from drug store   6. Screening mammogram for breast cancer Mammogram scheduled    Next appt: 6 months.  I personally was present during the history, physical exam and medical decision-making activities of this service and have verified that the service and findings are accurately documented in the student's note Adalaide Jaskolski K. [place], [facility] [phone]

## 2021-05-27 NOTE — Patient Instructions (Signed)
To get the bivalent COVID vaccine

## 2021-05-28 LAB — COMPLETE METABOLIC PANEL WITH GFR
AG Ratio: 1.4 (calc) (ref 1.0–2.5)
ALT: 14 U/L (ref 6–29)
AST: 19 U/L (ref 10–35)
Albumin: 4 g/dL (ref 3.6–5.1)
Alkaline phosphatase (APISO): 57 U/L (ref 37–153)
BUN: 15 mg/dL (ref 7–25)
CO2: 26 mmol/L (ref 20–32)
Calcium: 9.5 mg/dL (ref 8.6–10.4)
Chloride: 108 mmol/L (ref 98–110)
Creat: 0.88 mg/dL (ref 0.50–1.05)
Globulin: 2.9 g/dL (calc) (ref 1.9–3.7)
Glucose, Bld: 80 mg/dL (ref 65–99)
Potassium: 4.2 mmol/L (ref 3.5–5.3)
Sodium: 144 mmol/L (ref 135–146)
Total Bilirubin: 0.3 mg/dL (ref 0.2–1.2)
Total Protein: 6.9 g/dL (ref 6.1–8.1)
eGFR: 72 mL/min/{1.73_m2} (ref >=60)

## 2021-05-28 LAB — HEMOGLOBIN A1C
Hgb A1c MFr Bld: 6.3 % of total Hgb — ABNORMAL HIGH (ref <5.7)
Mean Plasma Glucose: 134 mg/dL
eAG (mmol/L): 7.4 mmol/L

## 2021-05-30 ENCOUNTER — Telehealth: Payer: Self-pay | Admitting: *Deleted

## 2021-05-30 NOTE — Telephone Encounter (Signed)
Patient called and stated that she is out of town in [place] and has lost her Social Security Card and her Information systems manager. Stated that the [facility] told her that in order to replace she would need her Medical Records from her Doctor's office.   I asked her if she went to the Social Security office and she stated that she had and they said the same thing. Stated that she had her Birth Certificate but they would not accept that.   I told her that if she needed her Medical Records she would need to come by the office to sign a Release. She stated that she would do that on Monday.

## 2021-06-07 ENCOUNTER — Telehealth: Payer: Self-pay | Admitting: Orthopaedic Surgery

## 2021-06-07 NOTE — Telephone Encounter (Signed)
Pt called requesting a letter for her employer with verifying her surgery date and estimated time out. Please call pt when letter is ready for pick up. Pt phone number is [phone]. Please call pt it this can be done today.

## 2021-06-08 NOTE — Telephone Encounter (Signed)
Do we have su date?

## 2021-06-09 NOTE — Telephone Encounter (Signed)
6-12 weeks 

## 2021-07-05 ENCOUNTER — Other Ambulatory Visit: Payer: Self-pay

## 2021-07-05 DIAGNOSIS — Z111 Encounter for screening for respiratory tuberculosis: Secondary | ICD-10-CM

## 2021-07-06 ENCOUNTER — Other Ambulatory Visit: Payer: Self-pay

## 2021-07-06 ENCOUNTER — Other Ambulatory Visit: Payer: Medicare HMO

## 2021-07-06 DIAGNOSIS — Z111 Encounter for screening for respiratory tuberculosis: Secondary | ICD-10-CM

## 2021-07-08 LAB — QUANTIFERON-TB GOLD PLUS
Mitogen-NIL: >10 IU/mL
NIL: 0.04 IU/mL
QuantiFERON-TB Gold Plus: NEGATIVE
TB1-NIL: 0.01 IU/mL
TB2-NIL: 0 IU/mL

## 2021-07-25 ENCOUNTER — Ambulatory Visit: Admit: 2021-07-25 | Payer: Medicare HMO | Admitting: Orthopaedic Surgery

## 2021-07-25 SURGERY — ARTHROPLASTY, HIP, TOTAL, ANTERIOR APPROACH
Anesthesia: Spinal | Site: Hip | Laterality: Right

## 2021-07-27 ENCOUNTER — Encounter: Payer: Self-pay | Admitting: Orthopaedic Surgery

## 2021-07-27 ENCOUNTER — Ambulatory Visit (INDEPENDENT_AMBULATORY_CARE_PROVIDER_SITE_OTHER): Payer: Medicare HMO | Admitting: Orthopaedic Surgery

## 2021-07-27 ENCOUNTER — Other Ambulatory Visit: Payer: Self-pay

## 2021-07-27 ENCOUNTER — Telehealth: Payer: Self-pay | Admitting: Physician Assistant

## 2021-07-27 DIAGNOSIS — M1611 Unilateral primary osteoarthritis, right hip: Secondary | ICD-10-CM

## 2021-07-27 MED ORDER — HYDROCODONE-ACETAMINOPHEN 5-325 MG PO TABS: 1.0000 | ORAL_TABLET | Freq: Two times a day (BID) | ORAL | 0 refills | Status: DC | PRN

## 2021-07-27 NOTE — Telephone Encounter (Signed)
This patient had to cancel her THA scheduled for this past Monday as she did not have help for the post-op period.  She came in today wanting to reschedule and possibly have an injection in the meantime if it will be awhile before getting back on the schedule.  I told her that I needed to ask you.  How long will it be before you can get her back on the books?  Our plan is to keep her in the hospital for a few days to work with PT so that she can safely return home alone.

## 2021-07-27 NOTE — Progress Notes (Signed)
Office Visit Note   Patient: [name]           Date of Birth: [birth year]           MRN: [id number] Visit Date: 07/27/2021              Requested by: [name], NP [address] [place] [place],  [place] [address] PCP: [name], NP   Assessment & Plan: Visit Diagnoses:  1. Primary osteoarthritis of right hip     Plan: Impression is advanced degenerative joint disease right hip.  Today we had a discussion about rescheduling her surgery and having her stay a few extra nights to work with therapy so that she is able to return home safely without assistance.  She does like this plan and would like to reschedule.  I will reach out to Debbie to do this.  If for some reason we are unable to get her on the schedule greater than 2 months from now, she would like to undergo cortisone injection.  We will make referral to Dr. Alvester Morin if this is the case.  Follow-Up Instructions: Return if symptoms worsen or fail to improve.   Orders:  No orders of the defined types were placed in this encounter.  Meds ordered this encounter  Medications   HYDROcodone-acetaminophen (NORCO) 5-325 MG tablet    Sig: Take 1 tablet by mouth 2 (two) times daily as needed.    Dispense:  30 tablet    Refill:  0      Procedures: No procedures performed   Clinical Data: No additional findings.   Subjective: Chief Complaint  Patient presents with   Right Hip - Pain    HPI patient is a pleasant 66 year old female who comes in today with chronic right hip pain.  She was scheduled to undergo right total hip replacement by [place] earlier in the week but canceled due to not having anyone to take care of her after the surgery.  She is here today inquiring about cortisone injection.     Objective: Vital Signs: There were no vitals taken for this visit.    Ortho Exam right hip exam shows significantly positive logroll and FADIR.  Specialty Comments:  No specialty comments  available.  Imaging: No new imaging   PMFS History: Patient Active Problem List   Diagnosis Date Noted   Primary osteoarthritis of right hip 03/24/2021   TRIGGER FINGER, LEFT THUMB 08/12/2009   ANXIETY 05/20/2009   CANDIDIASIS, SKIN 03/12/2009   TINGLING 12/24/2008   PHARYNGITIS 08/31/2008   UPPER RESPIRATORY INFECTION 08/31/2008   CHEST PAIN 02/03/2008   HYPOKALEMIA 11/07/2007   Other specified disorders of bladder 09/26/2007   GASTRITIS 08/07/2007   HEADACHE 08/07/2007   CONSTIPATION 07/23/2007   MICROSCOPIC HEMATURIA 07/23/2007   DENTAL CARIES 05/27/2007   TINEA PEDIS 05/23/2007   ALLERGIC RHINITIS 05/23/2007   GERD 05/23/2007   HIATAL HERNIA 05/23/2007   DEGENERATIVE JOINT DISEASE, LEFT KNEE 05/23/2007   ESSENTIAL HYPERTENSION, BENIGN 05/20/2007   Past Medical History:  Diagnosis Date   Acid reflux    [facility]   Alcohol use    [facility]   Arthritis    Knee and hip   BMI 36.0-36.9,adult    Per Records received from [facility]   Chronic knee pain    L & R   GERD (gastroesophageal reflux disease)    Heart murmur, systolic  Per Records received from [facility]   Hyperlipidemia    Hypertension    Morbid obesity [facility])    Per Records received from [facility]   Other osteoarthritis involving multiple joints    Per Records received from [facility]   Prediabetes    Per Records received from [facility]   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[facility])    Per records from [place]   Right hip pain    Per records from [place]   Right thigh pain 2022   Per records from [place]   Vitamin D deficiency    Per Records received from [place] History  Problem Relation Age of Onset   Hypertension Mother        Per records from [place] Mother 31       colon   Colon  cancer Mother        Per records from [place] Father 72       lung   Hypertension Father        Per records from [place] disease Father        Per records from [place]   Seizures Brother    Epilepsy Brother    Drug abuse Daughter    Other Daughter        [place] in 2014    Past Surgical History:  Procedure Laterality Date   JOINT REPLACEMENT Left 07/11/2018   knee replacement   TOE [place]   Per records from [place] [facility] [place] History   Occupational History   Not on file  Tobacco Use   Smoking status: Never   Smokeless tobacco: Never  Vaping Use   Vaping Use: Never used  Substance and Sexual Activity   Alcohol use: No   Drug use: No   Sexual activity: Not on file

## 2021-08-05 ENCOUNTER — Other Ambulatory Visit: Payer: Self-pay

## 2021-08-09 ENCOUNTER — Encounter: Payer: Medicare HMO | Admitting: Orthopaedic Surgery

## 2021-08-18 DIAGNOSIS — E785 Hyperlipidemia, unspecified: Secondary | ICD-10-CM | POA: Insufficient documentation

## 2021-08-18 DIAGNOSIS — E669 Obesity, unspecified: Secondary | ICD-10-CM | POA: Diagnosis not present

## 2021-08-18 DIAGNOSIS — M1990 Unspecified osteoarthritis, unspecified site: Secondary | ICD-10-CM | POA: Diagnosis not present

## 2021-08-18 DIAGNOSIS — I10 Essential (primary) hypertension: Secondary | ICD-10-CM | POA: Diagnosis not present

## 2021-08-22 ENCOUNTER — Telehealth: Payer: Self-pay

## 2021-08-22 NOTE — Telephone Encounter (Signed)
Ok to do letter

## 2021-08-22 NOTE — Telephone Encounter (Signed)
yes

## 2021-08-22 NOTE — Telephone Encounter (Signed)
Pt called into the office and would like to know if she can have a letter stating that she is having surgery on the 15th so she can turn It in to her work

## 2021-08-24 ENCOUNTER — Telehealth: Payer: Self-pay | Admitting: Orthopaedic Surgery

## 2021-08-24 NOTE — Telephone Encounter (Signed)
Ready for pick up. Called patient no answer. LMOM.  

## 2021-08-24 NOTE — Telephone Encounter (Signed)
Pt called asking for Marisue Ivan to call when letter for work that she is having surgery September 21, 2021 is ready for pick up. Please call pt at [phone].

## 2021-08-26 DIAGNOSIS — Z6832 Body mass index (BMI) 32.0-32.9, adult: Secondary | ICD-10-CM | POA: Diagnosis not present

## 2021-08-26 DIAGNOSIS — E669 Obesity, unspecified: Secondary | ICD-10-CM | POA: Diagnosis not present

## 2021-08-26 DIAGNOSIS — M1990 Unspecified osteoarthritis, unspecified site: Secondary | ICD-10-CM | POA: Diagnosis not present

## 2021-08-26 DIAGNOSIS — I160 Hypertensive urgency: Secondary | ICD-10-CM | POA: Diagnosis not present

## 2021-08-26 DIAGNOSIS — L03114 Cellulitis of left upper limb: Secondary | ICD-10-CM | POA: Diagnosis not present

## 2021-08-26 DIAGNOSIS — I10 Essential (primary) hypertension: Secondary | ICD-10-CM | POA: Diagnosis not present

## 2021-08-26 DIAGNOSIS — E876 Hypokalemia: Secondary | ICD-10-CM | POA: Diagnosis not present

## 2021-08-26 DIAGNOSIS — E785 Hyperlipidemia, unspecified: Secondary | ICD-10-CM | POA: Diagnosis not present

## 2021-08-26 DIAGNOSIS — A419 Sepsis, unspecified organism: Secondary | ICD-10-CM | POA: Diagnosis not present

## 2021-08-27 DIAGNOSIS — A419 Sepsis, unspecified organism: Secondary | ICD-10-CM | POA: Diagnosis not present

## 2021-08-28 DIAGNOSIS — A419 Sepsis, unspecified organism: Secondary | ICD-10-CM | POA: Diagnosis not present

## 2021-08-29 ENCOUNTER — Ambulatory Visit
Admission: RE | Admit: 2021-08-29 | Discharge: 2021-08-29 | Disposition: A | Discharge status: Home / Self Care | Payer: Medicare HMO | Source: Ambulatory Visit | Attending: Nurse Practitioner | Admitting: Nurse Practitioner

## 2021-08-29 ENCOUNTER — Telehealth: Payer: Self-pay | Admitting: *Deleted

## 2021-08-29 ENCOUNTER — Telehealth: Payer: Self-pay

## 2021-08-29 DIAGNOSIS — Z1231 Encounter for screening mammogram for malignant neoplasm of breast: Secondary | ICD-10-CM

## 2021-08-29 DIAGNOSIS — Z780 Asymptomatic menopausal state: Secondary | ICD-10-CM | POA: Diagnosis not present

## 2021-08-29 DIAGNOSIS — E2839 Other primary ovarian failure: Secondary | ICD-10-CM

## 2021-08-29 DIAGNOSIS — M8588 Other specified disorders of bone density and structure, other site: Secondary | ICD-10-CM | POA: Diagnosis not present

## 2021-08-29 NOTE — Telephone Encounter (Signed)
-----   Message from [name], NP sent at 08/29/2021  4:11 PM EST ----- Osteopenia--Recommended to take calcium 600 mg twice daily with Vitamin D 2000 units daily and weight bearing activity 30 mins/5 days a week

## 2021-08-29 NOTE — Telephone Encounter (Signed)
Transition Care Management Follow-up Telephone Call Date of discharge and from where: 08/28/2021 [facility] How have you been since you were released from the hospital? good Any questions or concerns? No  Items Reviewed: Did the pt receive and understand the discharge instructions provided? Yes  Medications obtained and verified? Yes  Other? No  Any new allergies since your discharge? No  Dietary orders reviewed? Yes Do you have support at home? Yes   Home Care and Equipment/Supplies: Were home health services ordered? no If so, what is the name of the agency? na  Has the agency set up a time to come to the patient's home? not applicable Were any new equipment or medical supplies ordered?  No What is the name of the medical supply agency? na Were you able to get the supplies/equipment? not applicable Do you have any questions related to the use of the equipment or supplies? No  Functional Questionnaire: (I = Independent and D = Dependent) ADLs: I  Bathing/Dressing- I  Meal Prep- I  Eating- I  Maintaining continence- I  Transferring/Ambulation- I  Managing Meds- I  Follow up appointments reviewed:  PCP Hospital f/u appt confirmed? Yes  Scheduled to see Dinah on 09/05/2021 @ 9. [facility] f/u appt confirmed? No   Are transportation arrangements needed? No  If their condition worsens, is the pt aware to call PCP or go to the Emergency Dept.? Yes Was the patient provided with contact information for the PCP's office or ED? Yes Was to pt encouraged to call back with questions or concerns? Yes

## 2021-08-29 NOTE — Telephone Encounter (Signed)
Discussed results with patient, patient verbalized understanding of results  Medication list updated to reflect recommendations.

## 2021-08-31 ENCOUNTER — Other Ambulatory Visit: Payer: Self-pay | Admitting: Nurse Practitioner

## 2021-08-31 DIAGNOSIS — R928 Other abnormal and inconclusive findings on diagnostic imaging of breast: Secondary | ICD-10-CM

## 2021-09-05 ENCOUNTER — Other Ambulatory Visit: Payer: Self-pay

## 2021-09-05 ENCOUNTER — Encounter: Payer: Self-pay | Admitting: Family

## 2021-09-05 ENCOUNTER — Ambulatory Visit (INDEPENDENT_AMBULATORY_CARE_PROVIDER_SITE_OTHER): Payer: Medicare HMO | Admitting: Family

## 2021-09-05 VITALS — BP 130/84 | HR 63 | Temp 96.6°F | Ht 59.5 in | Wt 152.8 lb

## 2021-09-05 DIAGNOSIS — M1611 Unilateral primary osteoarthritis, right hip: Secondary | ICD-10-CM | POA: Diagnosis not present

## 2021-09-05 DIAGNOSIS — S41102D Unspecified open wound of left upper arm, subsequent encounter: Secondary | ICD-10-CM | POA: Diagnosis not present

## 2021-09-05 NOTE — Progress Notes (Signed)
Provider: Shadman Tozzi FNP-C  Stephanie Seller, NP  Patient Care Team: Stephanie Seller, NP as PCP - General (Geriatric Medicine)  Extended Emergency Contact Information Primary Emergency Contact: Hauswirth,[name] Address: [address]          [address], [place] [address] [place] Home Phone: [phone] Mobile Phone: [phone] Relation: Son Secondary Emergency Contact: Davis,Bessie Home Phone: [phone] Mobile Phone: [phone] Relation: Other Preferred language: English  Code Status: Full Code  Goals of care: Advanced Directive information Advanced Directives 05/27/2021  Does Patient Have a Medical Advance Directive? No  Does patient want to make changes to medical advance directive? -  Would patient like information on creating a medical advance directive? -     Chief Complaint  Patient presents with   Hospitalization Follow-up    Patient presents today for a hospital follow-up. She was admitted into [facility], [place], [place] on 09/02/21-09/04/21 for a left lower arm infection.    HPI:  Pt is a 66 y.o. female seen today for hospital follow up. She status post hospital admission 09/02/2021 - 09/04/2021 at [facility] in [place] for left forearm infection.No records from the Hospital for evaluation. States feels much better since she was discharged.wound has healed just has scab areas.states completed antibiotics as prescribed from the hospital.does not recall name of antibiotics. Has upcoming right hip replacement planned 09/21/2021  with Dr.Xu Naiping.  She denies any new acute issues today.   Past Medical History:  Diagnosis Date   Acid reflux    [facility]   Alcohol use    Per [facility] received from [facility]   Arthritis    Knee and hip   BMI 36.0-36.9,adult    Per [facility] received from [facility]   Chronic knee pain    L & R   GERD (gastroesophageal reflux  disease)    Heart murmur, systolic    Per Records received from [facility]   Hyperlipidemia    Hypertension    Morbid obesity (HCC)    Per Records received from [facility]   Other osteoarthritis involving multiple joints    Per Records received from [employer]   Prediabetes    Per Records received [facility]   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[facility])    Per records from [employer] [facility]   Right hip pain    Per records from [employer] [facility] the [facility]   Right thigh pain 2022   Per records from [employer] [facility]   Vitamin D deficiency    Per Records received from [facility]   Past Surgical History:  Procedure Laterality Date   JOINT REPLACEMENT Left 07/11/2018   knee replacement   TOE SURGERY     TUBAL LIGATION  1978   [facility]    Allergies  Allergen Reactions   Aspirin Other (See Comments)    Acid reflux   Ibuprofen Other (See Comments)    Acid reflux     Outpatient Encounter Medications as of 09/05/2021  Medication Sig   Acetaminophen (TYLENOL ARTHRITIS PAIN PO) Take 650 mg by mouth. Take 1-2 tablets daily   acetaminophen-codeine (TYLENOL #3) 300-30 MG tablet Take 1 tablet by mouth every 8 (eight) hours as needed for moderate pain.   Calcium Carbonate (CALCIUM 600 PO) Take 1 tablet by  mouth in the morning and at bedtime.   HYDROcodone-acetaminophen (NORCO) 5-325 MG tablet Take 1 tablet by mouth 2 (two) times daily as needed.   lisinopril-hydrochlorothiazide (ZESTORETIC) 20-12.5 MG tablet Take 1 tablet by mouth daily.   rosuvastatin (CRESTOR) 5 MG tablet Take 5 mg by mouth at bedtime.   VITAMIN D PO Take 2,000 Units by mouth daily.   No facility-administered encounter medications on file as of 09/05/2021.    Review of Systems  Constitutional:  Negative for appetite change, chills, fatigue and fever.  Respiratory:  Negative for cough, chest tightness, shortness of breath and  wheezing.   Cardiovascular:  Negative for chest pain, palpitations and leg swelling.  Skin:  Negative for color change, pallor and rash.       Left forearm wound site   Neurological:  Negative for dizziness, light-headedness, numbness and headaches.   Immunization History  Administered Date(s) Administered   Fluad Quad(high Dose 65+) 03/09/2021   Influenza Whole 04/19/2009   Influenza,inj,Quad PF,6+ Mos 05/17/2018   Moderna SARS-COV2 Booster Vaccination 01/14/2021   Moderna Sars-Covid-2 Vaccination 04/16/2020, 05/14/2020   PNEUMOCOCCAL CONJUGATE-20 04/25/2021   Td 08/10/2006   Pertinent  Health Maintenance Due  Topic Date Due   COLONOSCOPY (Pts 45-2772yrs Insurance coverage will need to be confirmed)  Never done   MAMMOGRAM  08/30/2023   INFLUENZA VACCINE  Completed   DEXA SCAN  Completed   Fall Risk 04/25/2021 09/05/2021  Falls in the past year? 0 0  Was there an injury with Fall? 0 0  Fall Risk Category Calculator 0 0  Fall Risk Category Low Low  Patient Fall Risk Level Low fall risk Low fall risk  Patient at Risk for Falls Due to No Fall Risks No Fall Risks  Fall risk Follow up Falls evaluation completed Falls evaluation completed;Education provided;Falls prevention discussed   Functional Status Survey:    Vitals:   09/05/21 0901  BP: 130/84  Pulse: 63  Temp: (!) 96.6 F (35.9 C)  SpO2: 97%  Weight: 152 lb 12.8 oz (69.3 kg)  Height: 4' 11.5" (1.511 m)   Body mass index is 30.35 kg/m. Physical Exam Vitals reviewed.  Constitutional:      General: She is not in acute distress.    Appearance: She is not ill-appearing.  Cardiovascular:     Rate and Rhythm: Normal rate and regular rhythm.     Pulses: Normal pulses.     Heart sounds: Normal heart sounds. No murmur heard.   No friction rub. No gallop.  Pulmonary:     Effort: Pulmonary effort is normal. No respiratory distress.     Breath sounds: Normal breath sounds. No stridor. No wheezing or rales.  Chest:      Chest wall: No tenderness.  Abdominal:     General: Bowel sounds are normal. There is no distension.     Palpations: Abdomen is soft. There is no mass.     Tenderness: There is no abdominal tenderness. There is no right CVA tenderness, left CVA tenderness, guarding or rebound.  Musculoskeletal:        General: No swelling or tenderness. Normal range of motion.     Right lower leg: No edema.     Left lower leg: No edema.  Skin:    General: Skin is warm and dry.     Coloration: Skin is not pale.     Findings: No bruising, erythema or rash.     Comments: Left forearm wound site pink with scab without  any erythema or drainage.No tenderness to palpation.wound site cleansed with saline,pat dry and Triple antibiotic ointment applied.  Neurological:     Mental Status: She is alert and oriented to person, place, and time.     Sensory: No sensory deficit.     Motor: No weakness.     Coordination: Coordination normal.     Gait: Gait normal.  Psychiatric:        Mood and Affect: Mood normal.        Behavior: Behavior normal.    Labs reviewed: Recent Labs    01/14/21 0000 05/27/21 1027  NA 140 144  K 4.1 4.2  CL 100 108  CO2 29* 26  GLUCOSE  --  80  BUN 19 15  CREATININE 0.8 0.88  CALCIUM 10.0 9.5   Recent Labs    01/14/21 0000 05/27/21 1027  AST 19 19  ALT 21 14  ALKPHOS 60  --   BILITOT  --  0.3  PROT  --  6.9  ALBUMIN 4.2  --    Recent Labs    01/14/21 0000  WBC 7.6  NEUTROABS 3,929.00  HGB 13.4  HCT 40  PLT 304   Lab Results  Component Value Date   TSH 0.65 01/14/2021   Lab Results  Component Value Date   HGBA1C 6.3 (H) 05/27/2021   Lab Results  Component Value Date   CHOL 167 01/14/2021   HDL 52 01/14/2021   LDLCALC 88 01/14/2021   TRIG 173 (A) 01/14/2021   CHOLHDL 4.6 Ratio 06/30/2010    Significant Diagnostic Results in last 30 days:  DG Bone Density  Result Date: 08/29/2021 EXAM: DUAL X-RAY ABSORPTIOMETRY (DXA) FOR BONE MINERAL DENSITY  IMPRESSION: Referring Physician:  Sharon Valdez Your patient completed a bone mineral density test using GE Lunar iDXA system (analysis version: 16). Technologist: SEC PATIENT: Name: [name], [name] Patient ID: [id number] Birth Date: [birth year] Height: 58.5 in. Sex: Female Measured: 08/29/2021 Weight: 156.0 lbs. Indications: Estrogen Deficient, Postmenopausal Fractures: NONE Treatments: Vitamin D (E933.5) ASSESSMENT: The BMD measured at AP Spine L1-L4 is 1.046 g/cm2 with a T-score of -1.1. This patient is considered osteopenic/low bone mass according to World Health Organization [facility]) criteria. The quality of the exam is good. Site Region Measured Date Measured Age YA BMD Significant CHANGE T-score AP Spine L1-L4 08/29/2021 66.6 -1.1 1.046 g/cm2 DualFemur Neck Right 08/29/2021 66.6 -0.7 0.941 g/cm2 DualFemur Total Mean 08/29/2021 66.6 0.1 1.024 g/cm2 Left Forearm Radius 33% 08/29/2021 66.6 -0.1 0.868 g/cm2 World Health Organization [facility]) criteria for post-menopausal, Caucasian Women: Normal       T-score at or above -1 SD Osteopenia   T-score between -1 and -2.5 SD Osteoporosis T-score at or below -2.5 SD RECOMMENDATION: 1. All patients should optimize calcium and vitamin D intake. 2. Consider FDA-approved medical therapies in postmenopausal women and men aged 73 years and older, based on the following: a. A hip or vertebral (clinical or morphometric) fracture. b. T-score = -2.5 at the femoral neck or spine after appropriate evaluation to exclude secondary causes. c. Low bone mass (T-score between -1.0 and -2.5 at the femoral neck or spine) and a 10-year probability of a hip fracture = 3% or a 10-year probability of a major osteoporosis-related fracture = 20% based on the [place]-adapted WHO algorithm. d. Clinician judgment and/or patient preferences may indicate treatment for people with 10-year fracture probabilities above or below these levels. FOLLOW-UP: Patients with diagnosis of osteoporosis or at high risk for  fracture should have  regular bone mineral density tests.? Patients eligible for Medicare are allowed routine testing every 2 years.? The testing frequency can be increased to one year for patients who have rapidly progressing disease, are receiving or discontinuing medical therapy to restore bone mass, or have additional risk factors. I have reviewed this study and agree with the findings. [facility] FRAX* 10-year Probability of Fracture Based on femoral neck BMD: DualFemur (Right) Major Osteoporotic Fracture: 3.3% Hip Fracture:                0.2% Population:                  [place] (Black) Risk Factors:                None *FRAX is a Armed forces logistics/support/administrative officer of the [employer] of [employer] for Metabolic Bone Disease, a World Science writer (WHO) [employer]. ASSESSMENT: The probability of a major osteoporotic fracture is 3.3% within the next ten years. The probability of a hip fracture is 0.2% within the next ten years. Electronically Signed   By: Ernie Avena M.D.   On: 08/29/2021 13:53   MM 3D SCREEN BREAST BILATERAL  Result Date: 08/30/2021 CLINICAL DATA:  Screening. EXAM: DIGITAL SCREENING BILATERAL MAMMOGRAM WITH TOMOSYNTHESIS AND CAD TECHNIQUE: Bilateral screening digital craniocaudal and mediolateral oblique mammograms were obtained. Bilateral screening digital breast tomosynthesis was performed. The images were evaluated with computer-aided detection. COMPARISON:  Previous exam(s). ACR Breast Density Category b: There are scattered areas of fibroglandular density. FINDINGS: In the left axilla, possibly enlarged lymph nodes further evaluation. In the right breast, no findings suspicious for malignancy. IMPRESSION: Further evaluation is suggested for possibly enlarged lymph nodes in the left axilla. RECOMMENDATION: Ultrasound of the left axilla. (Code:US-L-30M) The patient will be contacted regarding the findings, and additional imaging will be scheduled. BI-RADS  CATEGORY  0: Incomplete. Need additional imaging evaluation and/or prior mammograms for comparison. Electronically Signed   By: Bary Richard M.D.   On: 08/30/2021 08:33    Assessment/Plan  1. Primary osteoarthritis of right hip Has upcoming appointment on 09/21/2021 for hip replacement.   2. Open wound of left upper extremity, subsequent encounter Afebrile. Left forearm wound site pink with scab without any erythema or drainage.No tenderness to palpation.wound site cleansed with saline,pat dry and Triple antibiotic ointment applied. Advised to cleanse forearm with dial soap and warm water then pat dry and apply TAO once daily.   Family/ staff Communication: Reviewed plan of care with patient verbalized understanding.   Labs/tests ordered: None   Next Appointment: As needed if symptoms worsen or fail to improve    Caesar Bookman, NP

## 2021-09-05 NOTE — Patient Instructions (Signed)
-   cleanse left forearm wound site with dial soap and water,pat dry then apply topical antibiotic ointment once daily.Notify provider for any redness,swelling or drainage.

## 2021-09-14 ENCOUNTER — Ambulatory Visit
Admission: RE | Admit: 2021-09-14 | Discharge: 2021-09-14 | Disposition: A | Discharge status: Home / Self Care | Payer: Medicare HMO | Source: Ambulatory Visit | Attending: Nurse Practitioner | Admitting: Nurse Practitioner

## 2021-09-14 ENCOUNTER — Other Ambulatory Visit: Payer: Self-pay

## 2021-09-14 DIAGNOSIS — N6489 Other specified disorders of breast: Secondary | ICD-10-CM | POA: Diagnosis not present

## 2021-09-14 DIAGNOSIS — R928 Other abnormal and inconclusive findings on diagnostic imaging of breast: Secondary | ICD-10-CM

## 2021-09-15 ENCOUNTER — Encounter: Payer: Self-pay | Admitting: Orthopaedic Surgery

## 2021-09-15 ENCOUNTER — Ambulatory Visit (INDEPENDENT_AMBULATORY_CARE_PROVIDER_SITE_OTHER): Payer: Medicare HMO | Admitting: Orthopaedic Surgery

## 2021-09-15 ENCOUNTER — Ambulatory Visit (INDEPENDENT_AMBULATORY_CARE_PROVIDER_SITE_OTHER): Payer: Medicare HMO

## 2021-09-15 DIAGNOSIS — M1611 Unilateral primary osteoarthritis, right hip: Secondary | ICD-10-CM

## 2021-09-15 DIAGNOSIS — L03114 Cellulitis of left upper limb: Secondary | ICD-10-CM | POA: Diagnosis not present

## 2021-09-15 NOTE — Progress Notes (Addendum)
? ?Office Visit Note ?  ?Patient: [name]           ?Date of Birth: 1956-06-28           ?MRN: [id number] ?Visit Date: 09/15/2021 ?             ?Requested by: Lauree Chandler, NP ?[address] ?[place],   [id number] ?PCP: Lauree Chandler, NP ? ? ?Assessment & Plan: ?Visit Diagnoses:  ?1. Primary osteoarthritis of right hip   ?2. Cellulitis of left wrist   ? ? ?Plan: Tashira is here today to be checked out for couple of abrasions to the left arm that developed cellulitis.  She has total hip replacement scheduled for next week.  She had 2 pruritic areas on her left forearm that she scratched and unfortunately this developed cellulitis.  This has resolved with oral antibiotics which she has finished. ? ?Examination of the left wrist and forearm shows healed .  There is no evidence of infection. ? ?From my standpoint she is fine to have surgery next week.  Burt Knack met with her today about postsurgical arrangements.  We did get new x-rays of her hips today for presurgical planning. ? ?Follow-Up Instructions: No follow-ups on file.  ? ?Orders:  ?Orders Placed This Encounter  ?Procedures  ? XR HIP UNILAT W OR W/O PELVIS 2-3 VIEWS RIGHT  ? ?No orders of the defined types were placed in this encounter. ? ? ? ? Procedures: ?No procedures performed ? ? ?Clinical Data: ?No additional findings. ? ? ?Subjective: ?Chief Complaint  ?Patient presents with  ? Left Forearm - Wound Check  ? ? ?HPI ? ?Review of Systems  ?Constitutional: Negative.   ?HENT: Negative.    ?Eyes: Negative.   ?Respiratory: Negative.    ?Cardiovascular: Negative.   ?Endocrine: Negative.   ?Musculoskeletal: Negative.   ?Neurological: Negative.   ?Hematological: Negative.   ?Psychiatric/Behavioral: Negative.    ?All other systems reviewed and are negative. ? ? ?Objective: ?Vital Signs: There were no vitals taken for this visit. ? ?Physical Exam ?Vitals and nursing note reviewed.  ?Constitutional:   ?   Appearance: She is well-developed.   ?Pulmonary:  ?   Effort: Pulmonary effort is normal.  ?Skin: ?   General: Skin is warm.  ?   Capillary Refill: Capillary refill takes less than 2 seconds.  ?Neurological:  ?   Mental Status: She is alert and oriented to person, place, and time.  ?Psychiatric:     ?   Behavior: Behavior normal.     ?   Thought Content: Thought content normal.     ?   Judgment: Judgment normal.  ? ? ?Ortho Exam ? ?Specialty Comments:  ?No specialty comments available. ? ?Imaging: ?[place] AXILLA LEFT ? ?Result Date: 09/14/2021 ?CLINICAL DATA:  66 year old female recalled from screening mammogram dated 08/29/2021 for a possible left axillary lymphadenopathy. Additional history obtained from the patient; the patient developed a skin infection of the left arm the week of her screening mammogram. She was subsequently hospitalized and completed a course of antibiotics with improvement of her symptoms. EXAM: ULTRASOUND OF THE LEFT AXILLA COMPARISON:  Previous exam(s). FINDINGS: Ultrasound is performed, showing multiple morphologically normal lymph nodes within the low lying in deep left axilla. None of these demonstrate focal or diffuse cortical thickening beyond the upper limits of normal. IMPRESSION: No left axillary lymphadenopathy. Previous mammographic findings may have been related to the patient's left arm skin infection. RECOMMENDATION: Routine annual screening  in 1 year. I have discussed the findings and recommendations with the patient. If applicable, a reminder letter will be sent to the patient regarding the next appointment. BI-RADS CATEGORY  1: Negative. Electronically Signed   By: Kristopher Oppenheim M.D.   On: 09/14/2021 13:41 ? ?XR HIP UNILAT W OR W/O PELVIS 2-3 VIEWS RIGHT ? ?Result Date: 09/15/2021 ?Advanced degenerative joint disease with bone-on-bone joint space narrowing.  ? ? ?PMFS History: ?Patient Active Problem List  ? Diagnosis Date Noted  ? Primary osteoarthritis of right hip 03/24/2021  ? TRIGGER FINGER, LEFT THUMB  08/12/2009  ? ANXIETY 05/20/2009  ? CANDIDIASIS, SKIN 03/12/2009  ? TINGLING 12/24/2008  ? PHARYNGITIS 08/31/2008  ? UPPER RESPIRATORY INFECTION 08/31/2008  ? CHEST PAIN 02/03/2008  ? HYPOKALEMIA 11/07/2007  ? Other specified disorders of bladder 09/26/2007  ? GASTRITIS 08/07/2007  ? HEADACHE 08/07/2007  ? CONSTIPATION 07/23/2007  ? MICROSCOPIC HEMATURIA 07/23/2007  ? DENTAL CARIES 05/27/2007  ? TINEA PEDIS 05/23/2007  ? ALLERGIC RHINITIS 05/23/2007  ? GERD 05/23/2007  ? HIATAL HERNIA 05/23/2007  ? DEGENERATIVE JOINT DISEASE, LEFT KNEE 05/23/2007  ? ESSENTIAL HYPERTENSION, BENIGN 05/20/2007  ? ?Past Medical History:  ?Diagnosis Date  ? Acid reflux   ? Per records from [place]  ? Alcohol use   ? Per Records received from [facility]  ? Arthritis   ? Knee and hip  ? BMI 36.0-36.9,adult   ? Per Records received from [facility]  ? Chronic knee pain   ? L & R  ? GERD (gastroesophageal reflux disease)   ? Heart murmur, systolic   ? Per Records received from [facility]  ? Hyperlipidemia   ? Hypertension   ? Morbid obesity ([place])   ? Per Records received from [facility]  ? Other osteoarthritis involving multiple joints   ? Per Records received from [facility]  ? Prediabetes   ? Per Records received from [facility]  ? 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([place])   ? Per records from [place]  ? Right hip pain   ? Per records from [place]  ? Right thigh pain 2022  ? Per records from [place]  ? Vitamin D deficiency   ? Per Records received from [facility]  ?  ?Family History  ?Problem Relation Age of Onset  ? Hypertension Mother   ?     Per records from [place]  ? Cancer Mother 17  ?     colon  ? Colon cancer Mother   ?     Per records from [place]  ? Cancer Father 84  ?     lung  ? Hypertension Father   ?     Per records from [place]   ? Lung disease Father   ?     Per records from [place]  ? Seizures Brother   ? Epilepsy Brother   ? Drug abuse Daughter   ? Other Daughter   ?     Murder in 2014  ?  ?Past Surgical History:  ?Procedure Laterality Date  ? JOINT REPLACEMENT Left 07/11/2018  ? knee replacement  ? TOE SURGERY    ? TUBAL LIGATION  1978  ? Per records from [place]  ? ?Social History  ? ?Occupational  History  ? Not on file  ?Tobacco Use  ? Smoking status: Never  ? Smokeless tobacco: Never  ?Vaping Use  ? Vaping Use: Never used  ?Substance and Sexual Activity  ? Alcohol use: No  ? Drug use: No  ? Sexual activity: Not on file  ? ? ? ? ? ? ?

## 2021-09-15 NOTE — Addendum Note (Signed)
Addended by: Mayra Reel on: 09/15/2021 11:14 AM ? ? Modules accepted: Level of Service ? ?

## 2021-09-16 NOTE — Pre-Procedure Instructions (Signed)
Surgical Instructions ? ? ? Your procedure is scheduled on Wednesday 09/21/21. ? ? Report to [facility] Main Entrance "A" at 10:15 A.M., then check in with the Admitting office. ? Call this number if you have problems the morning of surgery: ? [phone] ? ? If you have any questions prior to your surgery date call [phone]: Open Monday-Friday 8am-4pm ? ? ? Remember: ? Do not eat after midnight the night before your surgery ? ?You may drink clear liquids until 09:15 A.M. the morning of your surgery.   ?Clear liquids allowed are: Water, Non-Citrus Juices (without pulp), Carbonated Beverages, Clear Tea, Black Coffee ONLY (NO MILK, CREAM OR POWDERED CREAMER of any kind), and Gatorade ? ? ?Enhanced Recovery after Surgery for Orthopedics ?Enhanced Recovery after Surgery is a protocol used to improve the stress on your body and your recovery after surgery. ? ?Patient Instructions ? ?The day of surgery (if you do NOT have diabetes):  ?Drink ONE (1) Pre-Surgery Clear Ensure by 09:15 am the morning of surgery   ?This drink was given to you during your hospital  ?pre-op appointment visit. ?Nothing else to drink after completing the  ?Pre-Surgery Clear Ensure. ? ?       If you have questions, please contact your surgeon?s office.  ?  ? Take these medicines the morning of surgery with A SIP OF WATER:  ? ? Take these medicines if needed:  ? acetaminophen (TYLENOL)  ? HYDROcodone-acetaminophen (NORCO) ? methocarbamol (ROBAXIN) ? ? ?As of today, STOP taking any Aspirin (unless otherwise instructed by your surgeon) Aleve, Naproxen, Ibuprofen, Motrin, Advil, Goody's, BC's, all herbal medications, fish oil, and all vitamins. ? ?         ?Do not wear jewelry or makeup ?Do not wear lotions, powders, perfumes/colognes, or deodorant. ?Do not shave 48 hours prior to surgery.  Men may shave face and neck. ?Do not bring valuables to the hospital. ?Do not wear nail polish, gel polish, artificial nails, or any other type of covering on  natural nails (fingers and toes) ?If you have artificial nails or gel coating that need to be removed by a nail salon, please have this removed prior to surgery. Artificial nails or gel coating may interfere with anesthesia's ability to adequately monitor your vital signs. ? ?[facility] is not responsible for any belongings or valuables. .  ? ?Do NOT Smoke (Tobacco/Vaping)  24 hours prior to your procedure ? ?If you use a CPAP at night, you may bring your mask for your overnight stay. ?  ?Contacts, glasses, hearing aids, dentures or partials may not be worn into surgery, please bring cases for these belongings ?  ?For patients admitted to the hospital, discharge time will be determined by your treatment team. ?  ?Patients discharged the day of surgery will not be allowed to drive home, and someone needs to stay with them for 24 hours. ? ?NO VISITORS WILL BE ALLOWED IN PRE-OP WHERE PATIENTS ARE PREPPED FOR SURGERY.  ONLY 1 SUPPORT PERSON MAY BE PRESENT IN THE WAITING ROOM WHILE YOU ARE IN SURGERY.  IF YOU ARE TO BE ADMITTED, ONCE YOU ARE IN YOUR ROOM YOU WILL BE ALLOWED TWO (2) VISITORS. 1 (ONE) VISITOR MAY STAY OVERNIGHT BUT MUST ARRIVE TO THE ROOM BY 8pm.  Minor children may have two parents present. Special consideration for safety and communication needs will be reviewed on a case by case basis. ? ?Special instructions:   ? ?Oral Hygiene is also important to reduce your  risk of infection.  Remember - BRUSH YOUR TEETH THE MORNING OF SURGERY WITH YOUR REGULAR TOOTHPASTE ? ? ?[facility]- Preparing For Surgery ? ?Before surgery, you can play an important role. Because skin is not sterile, your skin needs to be as free of germs as possible. You can reduce the number of germs on your skin by washing with CHG (chlorahexidine gluconate) Soap before surgery.  CHG is an antiseptic cleaner which kills germs and bonds with the skin to continue killing germs even after washing.   ? ? ?Please do not use if you have an  allergy to CHG or antibacterial soaps. If your skin becomes reddened/irritated stop using the CHG.  ?Do not shave (including legs and underarms) for at least 48 hours prior to first CHG shower. It is OK to shave your face. ? ?Please follow these instructions carefully. ?  ? ? Shower the NIGHT BEFORE SURGERY and the MORNING OF SURGERY with CHG Soap.  ? If you chose to wash your hair, wash your hair first as usual with your normal shampoo. After you shampoo, rinse your hair and body thoroughly to remove the shampoo.  Then [employer] and genitals (private parts) with your normal soap and rinse thoroughly to remove soap. ? ?After that Use CHG Soap as you would any other liquid soap. You can apply CHG directly to the skin and wash gently with a scrungie or a clean washcloth.  ? ?Apply the CHG Soap to your body ONLY FROM THE NECK DOWN.  Do not use on open wounds or open sores. Avoid contact with your eyes, ears, mouth and genitals (private parts). Wash Face and genitals (private parts)  with your normal soap.  ? ?Wash thoroughly, paying special attention to the area where your surgery will be performed. ? ?Thoroughly rinse your body with warm water from the neck down. ? ?DO NOT shower/wash with your normal soap after using and rinsing off the CHG Soap. ? ?Pat yourself dry with a CLEAN TOWEL. ? ?Wear CLEAN PAJAMAS to bed the night before surgery ? ?Place CLEAN SHEETS on your bed the night before your surgery ? ?DO NOT SLEEP WITH PETS. ? ? ?Day of Surgery: ? ?Take a shower with CHG soap. ?Wear Clean/Comfortable clothing the morning of surgery ?Do not apply any deodorants/lotions.   ?Remember to brush your teeth WITH YOUR REGULAR TOOTHPASTE. ? ? ? ?COVID testing ? ?If you are going to stay overnight or be admitted after your procedure/surgery and require a pre-op COVID test, please follow these instructions after your COVID test  ? ?You are not required to quarantine however you are required to wear a well-fitting mask when  you are out and around people not in your household.  If your mask becomes wet or soiled, replace with a new one. ? ?Wash your hands often with soap and water for 20 seconds or clean your hands with an alcohol-based hand sanitizer that contains at least 60% alcohol. ? ?Do not share personal items. ? ?Notify your provider: ?if you are in close contact with someone who has COVID  ?or if you develop a fever of 100.4 or greater, sneezing, cough, sore throat, shortness of breath or body aches. ? ?  ?Please read over the following fact sheets that you were given.  ? ?

## 2021-09-19 ENCOUNTER — Other Ambulatory Visit: Payer: Self-pay | Admitting: Physician Assistant

## 2021-09-19 ENCOUNTER — Encounter (HOSPITAL_COMMUNITY): Payer: Self-pay

## 2021-09-19 ENCOUNTER — Encounter (HOSPITAL_COMMUNITY)
Admission: RE | Admit: 2021-09-19 | Discharge: 2021-09-19 | Disposition: A | Discharge status: Home / Self Care | Payer: Medicare HMO | Source: Ambulatory Visit | Attending: Orthopaedic Surgery | Admitting: Orthopaedic Surgery

## 2021-09-19 ENCOUNTER — Other Ambulatory Visit: Payer: Self-pay

## 2021-09-19 VITALS — BP 153/75 | HR 64 | Temp 97.5°F | Resp 17 | Ht 59.0 in | Wt 159.0 lb

## 2021-09-19 DIAGNOSIS — Z01818 Encounter for other preprocedural examination: Secondary | ICD-10-CM | POA: Insufficient documentation

## 2021-09-19 DIAGNOSIS — R011 Cardiac murmur, unspecified: Secondary | ICD-10-CM

## 2021-09-19 DIAGNOSIS — Z20822 Contact with and (suspected) exposure to covid-19: Secondary | ICD-10-CM | POA: Diagnosis not present

## 2021-09-19 DIAGNOSIS — M1611 Unilateral primary osteoarthritis, right hip: Secondary | ICD-10-CM | POA: Diagnosis not present

## 2021-09-19 HISTORY — DX: Cardiac murmur, unspecified: R01.1

## 2021-09-19 LAB — CBC WITH DIFFERENTIAL/PLATELET
Abs Immature Granulocytes: 0.02 10*3/uL (ref 0.00–0.07)
Basophils Absolute: 0 10*3/uL (ref 0.0–0.1)
Basophils Relative: 0 %
Eosinophils Absolute: 0.3 10*3/uL (ref 0.0–0.5)
Eosinophils Relative: 3 %
HCT: 38.1 % (ref 36.0–46.0)
Hemoglobin: 12.3 g/dL (ref 12.0–15.0)
Immature Granulocytes: 0 %
Lymphocytes Relative: 42 %
Lymphs Abs: 3.4 10*3/uL (ref 0.7–4.0)
MCH: 30.4 pg (ref 26.0–34.0)
MCHC: 32.3 g/dL (ref 30.0–36.0)
MCV: 94.1 fL (ref 80.0–100.0)
Monocytes Absolute: 0.4 10*3/uL (ref 0.1–1.0)
Monocytes Relative: 5 %
Neutro Abs: 4 10*3/uL (ref 1.7–7.7)
Neutrophils Relative %: 50 %
Platelets: 274 10*3/uL (ref 150–400)
RBC: 4.05 MIL/uL (ref 3.87–5.11)
RDW: 12.2 % (ref 11.5–15.5)
WBC: 8.1 10*3/uL (ref 4.0–10.5)
nRBC: 0 % (ref 0.0–0.2)

## 2021-09-19 LAB — COMPREHENSIVE METABOLIC PANEL
ALT: 16 U/L (ref 0–44)
AST: 22 U/L (ref 15–41)
Albumin: 3.8 g/dL (ref 3.5–5.0)
Alkaline Phosphatase: 56 U/L (ref 38–126)
Anion gap: 8 (ref 5–15)
BUN: 18 mg/dL (ref 8–23)
CO2: 29 mmol/L (ref 22–32)
Calcium: 9.3 mg/dL (ref 8.9–10.3)
Chloride: 101 mmol/L (ref 98–111)
Creatinine, Ser: 0.86 mg/dL (ref 0.44–1.00)
GFR, Estimated: >60 mL/min (ref >60)
Glucose, Bld: 89 mg/dL (ref 70–99)
Potassium: 3.3 mmol/L — ABNORMAL LOW (ref 3.5–5.1)
Sodium: 138 mmol/L (ref 135–145)
Total Bilirubin: 0.7 mg/dL (ref 0.3–1.2)
Total Protein: 7.3 g/dL (ref 6.5–8.1)

## 2021-09-19 LAB — SURGICAL PCR SCREEN
MRSA, PCR: NEGATIVE
Staphylococcus aureus: NEGATIVE

## 2021-09-19 LAB — SARS CORONAVIRUS 2 (TAT 6-24 HRS): SARS Coronavirus 2: NEGATIVE

## 2021-09-19 MED ORDER — RIVAROXABAN 10 MG PO TABS: 10.0000 mg | ORAL_TABLET | Freq: Every day | ORAL | 0 refills | Status: DC

## 2021-09-19 MED ORDER — DOCUSATE SODIUM 100 MG PO CAPS: 100.0000 mg | ORAL_CAPSULE | Freq: Every day | ORAL | 2 refills | Status: DC | PRN

## 2021-09-19 MED ORDER — OXYCODONE-ACETAMINOPHEN 5-325 MG PO TABS: 1.0000 | ORAL_TABLET | Freq: Four times a day (QID) | ORAL | 0 refills | Status: DC | PRN

## 2021-09-19 MED ORDER — ONDANSETRON HCL 4 MG PO TABS: 4.0000 mg | ORAL_TABLET | Freq: Three times a day (TID) | ORAL | 0 refills | Status: DC | PRN

## 2021-09-19 MED ORDER — METHOCARBAMOL 500 MG PO TABS: 500.0000 mg | ORAL_TABLET | Freq: Two times a day (BID) | ORAL | 0 refills | Status: DC | PRN

## 2021-09-19 NOTE — Progress Notes (Signed)
PCP - Sherrie Mustache, NP ?Cardiologist - denies ? ?PPM/ICD - denies ? ? ?Chest x-ray - 07/07/2014 ?EKG - 09/19/21 ?Stress Test - denies ?ECHO - denies ?Cardiac Cath - denies ? ?Sleep Study - denies ? ?DM- denies ? ? ?ASA/Blood Thinner Instructions: n/a ? ? ?ERAS Protcol - yes ?PRE-SURGERY Ensure given at PAT ? ?COVID TEST- 09/19/21 ? ? ?Anesthesia review: yes, Jeneen Rinks evaluated heart murmur on this pt and his aware of her. ? ?Patient denies shortness of breath, fever, cough and chest pain at PAT appointment ? ? ?All instructions explained to the patient, with a verbal understanding of the material. Patient agrees to go over the instructions while at home for a better understanding. Patient also instructed to wear a mask in public after being tested for COVID-19. The opportunity to ask questions was provided. ?  ?

## 2021-09-20 ENCOUNTER — Other Ambulatory Visit: Payer: Self-pay | Admitting: Physician Assistant

## 2021-09-20 ENCOUNTER — Encounter (HOSPITAL_COMMUNITY): Payer: Self-pay

## 2021-09-20 MED ORDER — TRANEXAMIC ACID 1000 MG/10ML IV SOLN
2000.0000 mg | INTRAVENOUS | Status: AC
  Filled 2021-09-20: qty 20

## 2021-09-20 MED ORDER — ASPIRIN EC 81 MG PO TBEC: 81.0000 mg | DELAYED_RELEASE_TABLET | Freq: Two times a day (BID) | ORAL | 0 refills | Status: DC

## 2021-09-20 NOTE — Anesthesia Preprocedure Evaluation (Addendum)
Anesthesia Evaluation  ?Patient identified by MRN, date of birth, ID band ?Patient awake ? ? ? ?Reviewed: ?Allergy & Precautions, NPO status , Patient's Chart, lab work & pertinent test results ? ?Airway ?Mallampati: II ? ?TM Distance: >3 FB ?Neck ROM: Full ? ? ? Dental ? ?(+) Missing, Dental Advisory Given,  ?  ?Pulmonary ?neg pulmonary ROS,  ?  ?Pulmonary exam normal ?breath sounds clear to auscultation ? ? ? ? ? ? Cardiovascular ?hypertension, Pt. on medications ?Normal cardiovascular exam ?Rhythm:Regular Rate:Normal ? ? ?  ?Neuro/Psych ? Headaches, PSYCHIATRIC DISORDERS Anxiety   ? GI/Hepatic ?Neg liver ROS, GERD  ,  ?Endo/Other  ?negative endocrine ROS ? Renal/GU ?negative Renal ROS  ?negative genitourinary ?  ?Musculoskeletal ? ?(+) Arthritis , Rheumatoid disorders,   ? Abdominal ?  ?Peds ? Hematology ?negative hematology ROS ?(+)   ?Anesthesia Other Findings ? ? Reproductive/Obstetrics ? ?  ? ? ? ? ? ? ? ? ? ? ? ? ? ?  ?  ? ? ? ? ? ? ? ?Anesthesia Physical ?Anesthesia Plan ? ?ASA: 2 ? ?Anesthesia Plan: Spinal  ? ?Post-op Pain Management: Tylenol PO (pre-op)*  ? ?Induction:  ? ?PONV Risk Score and Plan: 2 and Treatment may vary due to age or medical condition, Midazolam, Propofol infusion, Ondansetron and Dexamethasone ? ?Airway Management Planned: Natural Airway ? ?Additional Equipment:  ? ?Intra-op Plan:  ? ?Post-operative Plan:  ? ?Informed Consent: I have reviewed the patients History and Physical, chart, labs and discussed the procedure including the risks, benefits and alternatives for the proposed anesthesia with the patient or authorized representative who has indicated his/her understanding and acceptance.  ? ? ? ?Dental advisory given ? ?Plan Discussed with: CRNA ? ?Anesthesia Plan Comments:   ? ? ? ? ? ? ?Anesthesia Quick Evaluation ? ?

## 2021-09-21 ENCOUNTER — Telehealth: Payer: Self-pay | Admitting: *Deleted

## 2021-09-21 ENCOUNTER — Ambulatory Visit (HOSPITAL_COMMUNITY): Payer: Medicare HMO

## 2021-09-21 ENCOUNTER — Ambulatory Visit (HOSPITAL_BASED_OUTPATIENT_CLINIC_OR_DEPARTMENT_OTHER): Payer: Medicare HMO | Admitting: Anesthesiology

## 2021-09-21 ENCOUNTER — Encounter (HOSPITAL_COMMUNITY)
Admission: RE | Disposition: A | Discharge status: Home Health | Payer: Self-pay | Source: Ambulatory Visit | Attending: Orthopaedic Surgery

## 2021-09-21 ENCOUNTER — Other Ambulatory Visit: Payer: Self-pay

## 2021-09-21 ENCOUNTER — Encounter (HOSPITAL_COMMUNITY): Payer: Self-pay | Admitting: Orthopaedic Surgery

## 2021-09-21 ENCOUNTER — Observation Stay (HOSPITAL_COMMUNITY): Payer: Medicare HMO

## 2021-09-21 ENCOUNTER — Ambulatory Visit (HOSPITAL_COMMUNITY): Payer: Medicare HMO | Admitting: Physician Assistant

## 2021-09-21 ENCOUNTER — Observation Stay (HOSPITAL_COMMUNITY)
Admission: RE | Admit: 2021-09-21 | Discharge: 2021-09-22 | Disposition: A | Discharge status: Home Health | Payer: Medicare HMO | Source: Ambulatory Visit | Attending: Orthopaedic Surgery | Admitting: Orthopaedic Surgery

## 2021-09-21 DIAGNOSIS — I10 Essential (primary) hypertension: Secondary | ICD-10-CM | POA: Insufficient documentation

## 2021-09-21 DIAGNOSIS — Z7982 Long term (current) use of aspirin: Secondary | ICD-10-CM | POA: Diagnosis not present

## 2021-09-21 DIAGNOSIS — Z471 Aftercare following joint replacement surgery: Secondary | ICD-10-CM | POA: Diagnosis not present

## 2021-09-21 DIAGNOSIS — Z96652 Presence of left artificial knee joint: Secondary | ICD-10-CM | POA: Diagnosis not present

## 2021-09-21 DIAGNOSIS — M1611 Unilateral primary osteoarthritis, right hip: Principal | ICD-10-CM | POA: Diagnosis present

## 2021-09-21 DIAGNOSIS — Z79899 Other long term (current) drug therapy: Secondary | ICD-10-CM | POA: Diagnosis not present

## 2021-09-21 DIAGNOSIS — Z419 Encounter for procedure for purposes other than remedying health state, unspecified: Secondary | ICD-10-CM

## 2021-09-21 DIAGNOSIS — Z96641 Presence of right artificial hip joint: Secondary | ICD-10-CM | POA: Diagnosis not present

## 2021-09-21 DIAGNOSIS — T797XXA Traumatic subcutaneous emphysema, initial encounter: Secondary | ICD-10-CM | POA: Diagnosis not present

## 2021-09-21 DIAGNOSIS — Z96649 Presence of unspecified artificial hip joint: Secondary | ICD-10-CM

## 2021-09-21 LAB — ABO/RH: ABO/RH(D): O POS

## 2021-09-21 LAB — TYPE AND SCREEN
ABO/RH(D): O POS
Antibody Screen: NEGATIVE

## 2021-09-21 SURGERY — ARTHROPLASTY, HIP, TOTAL, ANTERIOR APPROACH
Anesthesia: Spinal | Site: Hip | Laterality: Right

## 2021-09-21 MED ORDER — OXYCODONE HCL ER 10 MG PO T12A
10.0000 mg | EXTENDED_RELEASE_TABLET | Freq: Two times a day (BID) | ORAL | Status: DC
  Administered 2021-09-21 – 2021-09-22 (×2): 10 mg via ORAL
  Filled 2021-09-21 (×2): qty 1

## 2021-09-21 MED ORDER — OXYCODONE HCL 5 MG PO TABS
10.0000 mg | ORAL_TABLET | ORAL | Status: DC | PRN
  Administered 2021-09-21: 10 mg via ORAL

## 2021-09-21 MED ORDER — POLYETHYLENE GLYCOL 3350 17 G PO PACK
17.0000 g | PACK | Freq: Every day | ORAL | Status: DC
  Administered 2021-09-21 – 2021-09-22 (×2): 17 g via ORAL
  Filled 2021-09-21 (×2): qty 1

## 2021-09-21 MED ORDER — CHLORHEXIDINE GLUCONATE 0.12 % MT SOLN
15.0000 mL | Freq: Once | OROMUCOSAL | Status: AC
  Administered 2021-09-21: 15 mL via OROMUCOSAL
  Filled 2021-09-21: qty 15

## 2021-09-21 MED ORDER — DEXAMETHASONE SODIUM PHOSPHATE 10 MG/ML IJ SOLN
INTRAMUSCULAR | Status: AC
  Filled 2021-09-21: qty 1

## 2021-09-21 MED ORDER — VANCOMYCIN HCL 1000 MG IV SOLR
INTRAVENOUS | Status: AC
  Filled 2021-09-21: qty 20

## 2021-09-21 MED ORDER — ASPIRIN 81 MG PO CHEW
81.0000 mg | CHEWABLE_TABLET | Freq: Two times a day (BID) | ORAL | Status: DC
Start: 2021-09-21 — End: 2021-09-22
  Administered 2021-09-21 – 2021-09-22 (×2): 81 mg via ORAL
  Filled 2021-09-21 (×2): qty 1

## 2021-09-21 MED ORDER — LISINOPRIL-HYDROCHLOROTHIAZIDE 20-12.5 MG PO TABS: 1.0000 | ORAL_TABLET | Freq: Every day | ORAL | Status: DC

## 2021-09-21 MED ORDER — PHENYLEPHRINE 40 MCG/ML (10ML) SYRINGE FOR IV PUSH (FOR BLOOD PRESSURE SUPPORT)
PREFILLED_SYRINGE | INTRAVENOUS | Status: DC | PRN
  Administered 2021-09-21 (×3): 40 ug via INTRAVENOUS
  Administered 2021-09-21: 80 ug via INTRAVENOUS
  Administered 2021-09-21: 120 ug via INTRAVENOUS
  Administered 2021-09-21: 40 ug via INTRAVENOUS

## 2021-09-21 MED ORDER — DOCUSATE SODIUM 100 MG PO CAPS
100.0000 mg | ORAL_CAPSULE | Freq: Two times a day (BID) | ORAL | Status: DC
  Administered 2021-09-21 – 2021-09-22 (×2): 100 mg via ORAL
  Filled 2021-09-21 (×2): qty 1

## 2021-09-21 MED ORDER — 0.9 % SODIUM CHLORIDE (POUR BTL) OPTIME
TOPICAL | Status: DC | PRN
  Administered 2021-09-21: 1000 mL

## 2021-09-21 MED ORDER — METHOCARBAMOL 500 MG PO TABS
500.0000 mg | ORAL_TABLET | Freq: Four times a day (QID) | ORAL | Status: DC | PRN
  Administered 2021-09-21 – 2021-09-22 (×2): 500 mg via ORAL
  Filled 2021-09-21 (×2): qty 1

## 2021-09-21 MED ORDER — OXYCODONE HCL 5 MG PO TABS
5.0000 mg | ORAL_TABLET | ORAL | Status: DC | PRN
  Administered 2021-09-22 (×3): 5 mg via ORAL
  Filled 2021-09-21 (×2): qty 1
  Filled 2021-09-21: qty 2
  Filled 2021-09-21: qty 1

## 2021-09-21 MED ORDER — ONDANSETRON HCL 4 MG/2ML IJ SOLN
INTRAMUSCULAR | Status: AC
  Filled 2021-09-21: qty 2

## 2021-09-21 MED ORDER — PHENYLEPHRINE 40 MCG/ML (10ML) SYRINGE FOR IV PUSH (FOR BLOOD PRESSURE SUPPORT)
PREFILLED_SYRINGE | INTRAVENOUS | Status: AC
  Filled 2021-09-21: qty 10

## 2021-09-21 MED ORDER — ACETAMINOPHEN 500 MG PO TABS
1000.0000 mg | ORAL_TABLET | Freq: Once | ORAL | Status: AC
  Administered 2021-09-21: 1000 mg via ORAL
  Filled 2021-09-21: qty 2

## 2021-09-21 MED ORDER — FENTANYL CITRATE (PF) 250 MCG/5ML IJ SOLN
INTRAMUSCULAR | Status: DC | PRN
  Administered 2021-09-21: 50 ug via INTRAVENOUS

## 2021-09-21 MED ORDER — FENTANYL CITRATE (PF) 250 MCG/5ML IJ SOLN
INTRAMUSCULAR | Status: AC
  Filled 2021-09-21: qty 5

## 2021-09-21 MED ORDER — HYDROMORPHONE HCL 1 MG/ML IJ SOLN: 0.5000 mg | INTRAMUSCULAR | Status: DC | PRN

## 2021-09-21 MED ORDER — BUPIVACAINE-MELOXICAM ER 400-12 MG/14ML IJ SOLN
INTRAMUSCULAR | Status: DC | PRN
  Administered 2021-09-21: 14 mL

## 2021-09-21 MED ORDER — TRANEXAMIC ACID-NACL 1000-0.7 MG/100ML-% IV SOLN
1000.0000 mg | Freq: Once | INTRAVENOUS | Status: AC
  Administered 2021-09-21: 1000 mg via INTRAVENOUS
  Filled 2021-09-21: qty 100

## 2021-09-21 MED ORDER — LACTATED RINGERS IV SOLN: INTRAVENOUS | Status: DC

## 2021-09-21 MED ORDER — POVIDONE-IODINE 10 % EX SWAB: 2.0000 "application " | Freq: Once | CUTANEOUS | Status: DC

## 2021-09-21 MED ORDER — MENTHOL 3 MG MT LOZG: 1.0000 | LOZENGE | OROMUCOSAL | Status: DC | PRN

## 2021-09-21 MED ORDER — ROCURONIUM BROMIDE 10 MG/ML (PF) SYRINGE
PREFILLED_SYRINGE | INTRAVENOUS | Status: AC
  Filled 2021-09-21: qty 10

## 2021-09-21 MED ORDER — METOCLOPRAMIDE HCL 5 MG PO TABS: 5.0000 mg | ORAL_TABLET | Freq: Three times a day (TID) | ORAL | Status: DC | PRN

## 2021-09-21 MED ORDER — METOCLOPRAMIDE HCL 5 MG/ML IJ SOLN: 5.0000 mg | Freq: Three times a day (TID) | INTRAMUSCULAR | Status: DC | PRN

## 2021-09-21 MED ORDER — CEFAZOLIN SODIUM-DEXTROSE 2-4 GM/100ML-% IV SOLN
2.0000 g | Freq: Four times a day (QID) | INTRAVENOUS | Status: AC
  Administered 2021-09-21 (×2): 2 g via INTRAVENOUS
  Filled 2021-09-21 (×2): qty 100

## 2021-09-21 MED ORDER — BUPIVACAINE IN DEXTROSE 0.75-8.25 % IT SOLN
INTRATHECAL | Status: DC | PRN
  Administered 2021-09-21: 1.8 mL via INTRATHECAL

## 2021-09-21 MED ORDER — ACETAMINOPHEN 325 MG PO TABS: 325.0000 mg | ORAL_TABLET | Freq: Four times a day (QID) | ORAL | Status: DC | PRN

## 2021-09-21 MED ORDER — MIDAZOLAM HCL 2 MG/2ML IJ SOLN
INTRAMUSCULAR | Status: AC
  Filled 2021-09-21: qty 2

## 2021-09-21 MED ORDER — FENTANYL CITRATE (PF) 100 MCG/2ML IJ SOLN
25.0000 ug | INTRAMUSCULAR | Status: DC | PRN
  Administered 2021-09-21 (×2): 25 ug via INTRAVENOUS

## 2021-09-21 MED ORDER — MIDAZOLAM HCL 2 MG/2ML IJ SOLN
INTRAMUSCULAR | Status: DC | PRN
Start: 2021-09-21 — End: 2021-09-21
  Administered 2021-09-21: 2 mg via INTRAVENOUS

## 2021-09-21 MED ORDER — SORBITOL 70 % SOLN: 30.0000 mL | Freq: Every day | Status: DC | PRN

## 2021-09-21 MED ORDER — TRANEXAMIC ACID-NACL 1000-0.7 MG/100ML-% IV SOLN
1000.0000 mg | INTRAVENOUS | Status: AC
  Administered 2021-09-21: 1000 mg via INTRAVENOUS
  Filled 2021-09-21: qty 100

## 2021-09-21 MED ORDER — PANTOPRAZOLE SODIUM 40 MG PO TBEC
40.0000 mg | DELAYED_RELEASE_TABLET | Freq: Every day | ORAL | Status: DC
  Administered 2021-09-21 – 2021-09-22 (×2): 40 mg via ORAL
  Filled 2021-09-21 (×2): qty 1

## 2021-09-21 MED ORDER — PHENOL 1.4 % MT LIQD: 1.0000 | OROMUCOSAL | Status: DC | PRN

## 2021-09-21 MED ORDER — ORAL CARE MOUTH RINSE: 15.0000 mL | Freq: Once | OROMUCOSAL | Status: AC

## 2021-09-21 MED ORDER — ALUM & MAG HYDROXIDE-SIMETH 200-200-20 MG/5ML PO SUSP: 30.0000 mL | ORAL | Status: DC | PRN

## 2021-09-21 MED ORDER — PROPOFOL 500 MG/50ML IV EMUL
INTRAVENOUS | Status: DC | PRN
  Administered 2021-09-21: 75 ug/kg/min via INTRAVENOUS

## 2021-09-21 MED ORDER — LISINOPRIL 20 MG PO TABS
20.0000 mg | ORAL_TABLET | Freq: Every day | ORAL | Status: DC
  Administered 2021-09-22: 20 mg via ORAL
  Filled 2021-09-21: qty 1

## 2021-09-21 MED ORDER — VANCOMYCIN HCL 1 G IV SOLR
INTRAVENOUS | Status: DC | PRN
Start: 2021-09-21 — End: 2021-09-21
  Administered 2021-09-21: 1000 mg via TOPICAL

## 2021-09-21 MED ORDER — SODIUM CHLORIDE 0.9 % IR SOLN
Status: DC | PRN
  Administered 2021-09-21: 1000 mL

## 2021-09-21 MED ORDER — DIPHENHYDRAMINE HCL 12.5 MG/5ML PO ELIX
25.0000 mg | ORAL_SOLUTION | ORAL | Status: DC | PRN
  Filled 2021-09-21: qty 10

## 2021-09-21 MED ORDER — ONDANSETRON HCL 4 MG/2ML IJ SOLN
4.0000 mg | Freq: Four times a day (QID) | INTRAMUSCULAR | Status: DC | PRN
  Administered 2021-09-21: 4 mg via INTRAVENOUS
  Filled 2021-09-21: qty 2

## 2021-09-21 MED ORDER — ONDANSETRON HCL 4 MG PO TABS
4.0000 mg | ORAL_TABLET | Freq: Four times a day (QID) | ORAL | Status: DC | PRN
Start: 2021-09-21 — End: 2021-09-22

## 2021-09-21 MED ORDER — IRRISEPT - 450ML BOTTLE WITH 0.05% CHG IN STERILE WATER, USP 99.95% OPTIME
TOPICAL | Status: DC | PRN
  Administered 2021-09-21: 450 mL via TOPICAL

## 2021-09-21 MED ORDER — BUPIVACAINE-MELOXICAM ER 400-12 MG/14ML IJ SOLN
INTRAMUSCULAR | Status: AC
  Filled 2021-09-21: qty 1

## 2021-09-21 MED ORDER — FENTANYL CITRATE (PF) 100 MCG/2ML IJ SOLN
INTRAMUSCULAR | Status: AC
  Filled 2021-09-21: qty 2

## 2021-09-21 MED ORDER — ONDANSETRON HCL 4 MG/2ML IJ SOLN
INTRAMUSCULAR | Status: DC | PRN
  Administered 2021-09-21: 4 mg via INTRAVENOUS

## 2021-09-21 MED ORDER — CEFAZOLIN SODIUM-DEXTROSE 2-4 GM/100ML-% IV SOLN
2.0000 g | INTRAVENOUS | Status: AC
  Administered 2021-09-21: 2 g via INTRAVENOUS
  Filled 2021-09-21: qty 100

## 2021-09-21 MED ORDER — DEXAMETHASONE SODIUM PHOSPHATE 10 MG/ML IJ SOLN
10.0000 mg | Freq: Once | INTRAMUSCULAR | Status: AC
  Administered 2021-09-22: 10 mg via INTRAVENOUS
  Filled 2021-09-21: qty 1

## 2021-09-21 MED ORDER — HYDROCHLOROTHIAZIDE 12.5 MG PO TABS
12.5000 mg | ORAL_TABLET | Freq: Every day | ORAL | Status: DC
  Administered 2021-09-22: 12.5 mg via ORAL
  Filled 2021-09-21: qty 1

## 2021-09-21 MED ORDER — LIDOCAINE 2% (20 MG/ML) 5 ML SYRINGE
INTRAMUSCULAR | Status: AC
  Filled 2021-09-21: qty 5

## 2021-09-21 MED ORDER — METHOCARBAMOL 1000 MG/10ML IJ SOLN
500.0000 mg | Freq: Four times a day (QID) | INTRAVENOUS | Status: DC | PRN
  Filled 2021-09-21: qty 5

## 2021-09-21 MED ORDER — SODIUM CHLORIDE 0.9 % IV SOLN: INTRAVENOUS | Status: DC

## 2021-09-21 MED ORDER — PROPOFOL 10 MG/ML IV BOLUS
INTRAVENOUS | Status: AC
  Filled 2021-09-21: qty 20

## 2021-09-21 MED ORDER — ACETAMINOPHEN 500 MG PO TABS
1000.0000 mg | ORAL_TABLET | Freq: Four times a day (QID) | ORAL | Status: DC
  Administered 2021-09-21 – 2021-09-22 (×3): 1000 mg via ORAL
  Filled 2021-09-21 (×3): qty 2

## 2021-09-21 MED ORDER — PROPOFOL 10 MG/ML IV BOLUS
INTRAVENOUS | Status: DC | PRN
Start: 2021-09-21 — End: 2021-09-21
  Administered 2021-09-21: 20 mg via INTRAVENOUS
  Administered 2021-09-21: 30 mg via INTRAVENOUS

## 2021-09-21 MED ORDER — EPHEDRINE 5 MG/ML INJ
INTRAVENOUS | Status: AC
  Filled 2021-09-21: qty 5

## 2021-09-21 SURGICAL SUPPLY — 69 items
ADH SKN CLS APL DERMABOND .7 (GAUZE/BANDAGES/DRESSINGS) ×1
BAG COUNTER SPONGE SURGICOUNT (BAG) ×2 IMPLANT
BAG DECANTER FOR FLEXI CONT (MISCELLANEOUS) ×2 IMPLANT
BAG SPNG CNTER NS LX DISP (BAG) ×1
CATH FOLEY 2WAY SLVR  5CC 14FR (CATHETERS) ×2
CATH FOLEY 2WAY SLVR 5CC 14FR (CATHETERS) IMPLANT
CELLS DAT CNTRL 66122 CELL SVR (MISCELLANEOUS) IMPLANT
COVER PERINEAL POST (MISCELLANEOUS) ×2 IMPLANT
COVER SURGICAL LIGHT HANDLE (MISCELLANEOUS) ×2 IMPLANT
CUP SECTOR GRIPTON 50MM (Cup) ×1 IMPLANT
DERMABOND ADVANCED (GAUZE/BANDAGES/DRESSINGS) ×1
DERMABOND ADVANCED .7 DNX12 (GAUZE/BANDAGES/DRESSINGS) IMPLANT
DRAPE C-ARM 42X72 X-RAY (DRAPES) ×2 IMPLANT
DRAPE POUCH INSTRU U-SHP 10X18 (DRAPES) ×2 IMPLANT
DRAPE STERI IOBAN 125X83 (DRAPES) ×2 IMPLANT
DRAPE U-SHAPE 47X51 STRL (DRAPES) ×4 IMPLANT
DRESSING AQUACEL AG SP 3.5X10 (GAUZE/BANDAGES/DRESSINGS) IMPLANT
DRSG AQUACEL AG ADV 3.5X10 (GAUZE/BANDAGES/DRESSINGS) ×2 IMPLANT
DRSG AQUACEL AG SP 3.5X10 (GAUZE/BANDAGES/DRESSINGS) ×2
DURAPREP 26ML APPLICATOR (WOUND CARE) ×4 IMPLANT
ELECT BLADE 4.0 EZ CLEAN MEGAD (MISCELLANEOUS) ×2
ELECT REM PT RETURN 9FT ADLT (ELECTROSURGICAL) ×2
ELECTRODE BLDE 4.0 EZ CLN MEGD (MISCELLANEOUS) ×1 IMPLANT
ELECTRODE REM PT RTRN 9FT ADLT (ELECTROSURGICAL) ×1 IMPLANT
GLOVE BIOGEL PI IND STRL 7.5 (GLOVE) ×4 IMPLANT
GLOVE BIOGEL PI INDICATOR 7.5 (GLOVE) ×4
GLOVE SURG LTX SZ7 (GLOVE) ×4 IMPLANT
GLOVE SURG UNDER POLY LF SZ7 (GLOVE) ×4 IMPLANT
GLOVE SURG UNDER POLY LF SZ7.5 (GLOVE) ×4 IMPLANT
GOWN STRL REIN XL XLG (GOWN DISPOSABLE) ×2 IMPLANT
GOWN STRL REUS W/ TWL LRG LVL3 (GOWN DISPOSABLE) IMPLANT
GOWN STRL REUS W/ TWL XL LVL3 (GOWN DISPOSABLE) ×1 IMPLANT
GOWN STRL REUS W/TWL LRG LVL3 (GOWN DISPOSABLE)
GOWN STRL REUS W/TWL XL LVL3 (GOWN DISPOSABLE) ×2
HANDPIECE INTERPULSE COAX TIP (DISPOSABLE) ×2
HEAD FEMORAL 32 CERAMIC (Hips) ×1 IMPLANT
HOOD PEEL AWAY FLYTE STAYCOOL (MISCELLANEOUS) ×4 IMPLANT
IV NS IRRIG 3000ML ARTHROMATIC (IV SOLUTION) ×2 IMPLANT
JET LAVAGE IRRISEPT WOUND (IRRIGATION / IRRIGATOR) ×2
KIT BASIN OR (CUSTOM PROCEDURE TRAY) ×2 IMPLANT
LAVAGE JET IRRISEPT WOUND (IRRIGATION / IRRIGATOR) ×1 IMPLANT
LINER ACET PNNCL PLUS4 NEUTRAL (Hips) IMPLANT
MARKER SKIN DUAL TIP RULER LAB (MISCELLANEOUS) ×2 IMPLANT
NDL SPNL 18GX3.5 QUINCKE PK (NEEDLE) ×1 IMPLANT
NEEDLE SPNL 18GX3.5 QUINCKE PK (NEEDLE) ×2 IMPLANT
PACK TOTAL JOINT (CUSTOM PROCEDURE TRAY) ×2 IMPLANT
PACK UNIVERSAL I (CUSTOM PROCEDURE TRAY) ×2 IMPLANT
PAD COLD SHLDR WRAP-ON (PAD) ×1 IMPLANT
PINNACLE PLUS 4 NEUTRAL (Hips) ×2 IMPLANT
RETRACTOR WND ALEXIS 18 MED (MISCELLANEOUS) IMPLANT
RTRCTR WOUND ALEXIS 18CM MED (MISCELLANEOUS)
SAW OSC TIP CART 19.5X105X1.3 (SAW) ×2 IMPLANT
SET HNDPC FAN SPRY TIP SCT (DISPOSABLE) ×1 IMPLANT
STAPLER VISISTAT 35W (STAPLE) IMPLANT
STEM FEM ACTIS HIGH SZ1 (Stem) ×1 IMPLANT
SUT ETHIBOND 2 V 37 (SUTURE) ×2 IMPLANT
SUT ETHILON 2 0 FS 18 (SUTURE) ×2 IMPLANT
SUT VIC AB 0 CT1 27 (SUTURE) ×2
SUT VIC AB 0 CT1 27XBRD ANBCTR (SUTURE) ×1 IMPLANT
SUT VIC AB 1 CTX 36 (SUTURE) ×2
SUT VIC AB 1 CTX36XBRD ANBCTR (SUTURE) ×1 IMPLANT
SUT VIC AB 2-0 CT1 27 (SUTURE) ×6
SUT VIC AB 2-0 CT1 TAPERPNT 27 (SUTURE) ×2 IMPLANT
SYR 50ML LL SCALE MARK (SYRINGE) ×2 IMPLANT
TOWEL GREEN STERILE (TOWEL DISPOSABLE) ×2 IMPLANT
TRAY CATH 16FR W/PLASTIC CATH (SET/KITS/TRAYS/PACK) ×1 IMPLANT
TRAY FOLEY W/BAG SLVR 16FR (SET/KITS/TRAYS/PACK) ×2
TRAY FOLEY W/BAG SLVR 16FR ST (SET/KITS/TRAYS/PACK) ×1 IMPLANT
YANKAUER SUCT BULB TIP NO VENT (SUCTIONS) ×2 IMPLANT

## 2021-09-21 NOTE — Transfer of Care (Signed)
Immediate Anesthesia Transfer of Care Note ? ?Patient: [name] ? ?Procedure(s) Performed: RIGHT TOTAL HIP ARTHROPLASTY ANTERIOR APPROACH (Right: Hip) ? ?Patient Location: PACU ? ?Anesthesia Type:MAC combined with regional for post-op pain ? ?Level of Consciousness: drowsy, patient cooperative and responds to stimulation ? ?Airway & Oxygen Therapy: Patient Spontanous Breathing ? ?Post-op Assessment: Report given to RN and Post -op Vital signs reviewed and stable ? ?Post vital signs: Reviewed and stable ? ?Last Vitals:  ?Vitals Value Taken Time  ?BP 131/67 09/21/21 1316  ?Temp    ?Pulse 49 09/21/21 1319  ?Resp 24 09/21/21 1319  ?SpO2 98 % 09/21/21 1319  ?Vitals shown include unvalidated device data. ? ?Last Pain:  ?Vitals:  ? 09/21/21 1001  ?TempSrc:   ?PainSc: 0-No pain  ?   ? ?  ? ?Complications: No notable events documented. ?

## 2021-09-21 NOTE — Discharge Instructions (Signed)
INSTRUCTIONS AFTER JOINT REPLACEMENT  ? ?Remove items at home which could result in a fall. This includes throw rugs or furniture in walking pathways ?ICE to the affected joint every three hours while awake for 30 minutes at a time, for at least the first 3-5 days, and then as needed for pain and swelling.  Continue to use ice for pain and swelling. You may notice swelling that will progress down to the foot and ankle.  This is normal after surgery.  Elevate your leg when you are not up walking on it.   ?Continue to use the breathing machine you got in the hospital (incentive spirometer) which will help keep your temperature down.  It is common for your temperature to cycle up and down following surgery, especially at night when you are not up moving around and exerting yourself.  The breathing machine keeps your lungs expanded and your temperature down. ? ? ?DIET:  As you were doing prior to hospitalization, we recommend a well-balanced diet. ? ?DRESSING / WOUND CARE / SHOWERING ? ?Keep the surgical dressing until follow up.  The dressing is water proof, so you can shower without any extra covering.  IF THE DRESSING FALLS OFF or the wound gets wet inside, change the dressing with sterile gauze.  Please use good hand washing techniques before changing the dressing.  Do not use any lotions or creams on the incision until instructed by your surgeon.   ? ?ACTIVITY ? ?Increase activity slowly as tolerated, but follow the weight bearing instructions below.   ?No driving for 6 weeks or until further direction given by your physician.  You cannot drive while taking narcotics.  ?No lifting or carrying greater than 10 lbs. until further directed by your surgeon. ?Avoid periods of inactivity such as sitting longer than an hour when not asleep. This helps prevent blood clots.  ?You may return to work once you are authorized by your doctor.  ? ? ? ?WEIGHT BEARING  ? ?Weight bearing as tolerated with assist device (walker, cane,  etc) as directed, use it as long as suggested by your surgeon or therapist, typically at least 4-6 weeks. ? ? ?EXERCISES ? ?Results after joint replacement surgery are often greatly improved when you follow the exercise, range of motion and muscle strengthening exercises prescribed by your doctor. Safety measures are also important to protect the joint from further injury. Any time any of these exercises cause you to have increased pain or swelling, decrease what you are doing until you are comfortable again and then slowly increase them. If you have problems or questions, call your caregiver or physical therapist for advice.  ? ?Rehabilitation is important following a joint replacement. After just a few days of immobilization, the muscles of the leg can become weakened and shrink (atrophy).  These exercises are designed to build up the tone and strength of the thigh and leg muscles and to improve motion. Often times heat used for twenty to thirty minutes before working out will loosen up your tissues and help with improving the range of motion but do not use heat for the first two weeks following surgery (sometimes heat can increase post-operative swelling).  ? ?These exercises can be done on a training (exercise) mat, on the floor, on a table or on a bed. Use whatever works the best and is most comfortable for you.    Use music or television while you are exercising so that the exercises are a pleasant break in your   day. This will make your life better with the exercises acting as a break in your routine that you can look forward to.   Perform all exercises about fifteen times, three times per day or as directed.  You should exercise both the operative leg and the other leg as well. ? ?Exercises include: ?  ?Quad Sets - Tighten up the muscle on the front of the thigh (Quad) and hold for 5-10 seconds.   ?Straight Leg Raises - With your knee straight (if you were given a brace, keep it on), lift the leg to 60  degrees, hold for 3 seconds, and slowly lower the leg.  Perform this exercise against resistance later as your leg gets stronger.  ?Leg Slides: Lying on your back, slowly slide your foot toward your buttocks, bending your knee up off the floor (only go as far as is comfortable). Then slowly slide your foot back down until your leg is flat on the floor again.  ?Angel Wings: Lying on your back spread your legs to the side as far apart as you can without causing discomfort.  ?Hamstring Strength:  Lying on your back, push your heel against the floor with your leg straight by tightening up the muscles of your buttocks.  Repeat, but this time bend your knee to a comfortable angle, and push your heel against the floor.  You may put a pillow under the heel to make it more comfortable if necessary.  ? ?A rehabilitation program following joint replacement surgery can speed recovery and prevent re-injury in the future due to weakened muscles. Contact your doctor or a physical therapist for more information on knee rehabilitation.  ? ? ?CONSTIPATION ? ?Constipation is defined medically as fewer than three stools per week and severe constipation as less than one stool per week.  Even if you have a regular bowel pattern at home, your normal regimen is likely to be disrupted due to multiple reasons following surgery.  Combination of anesthesia, postoperative narcotics, change in appetite and fluid intake all can affect your bowels.  ? ?YOU MUST use at least one of the following options; they are listed in order of increasing strength to get the job done.  They are all available over the counter, and you may need to use some, POSSIBLY even all of these options:   ? ?Drink plenty of fluids (prune juice may be helpful) and high fiber foods ?Colace 100 mg by mouth twice a day  ?Senokot for constipation as directed and as needed Dulcolax (bisacodyl), take with full Kita of water  ?Miralax (polyethylene glycol) once or twice a day as  needed. ? ?If you have tried all these things and are unable to have a bowel movement in the first 3-4 days after surgery call either your surgeon or your primary doctor.   ? ?If you experience loose stools or diarrhea, hold the medications until you stool forms back up.  If your symptoms do not get better within 1 week or if they get worse, check with your doctor.  If you experience "the worst abdominal pain ever" or develop nausea or vomiting, please contact the office immediately for further recommendations for treatment. ? ? ?ITCHING:  If you experience itching with your medications, try taking only a single pain pill, or even half a pain pill at a time.  You can also use Benadryl over the counter for itching or also to help with sleep.  ? ?TED HOSE STOCKINGS:  Use stockings on both   legs until for at least 2 weeks or as directed by physician office. They may be removed at night for sleeping. ? ?MEDICATIONS:  See your medication summary on the ?After Visit Summary? that nursing will review with you.  You may have some home medications which will be placed on hold until you complete the course of blood thinner medication.  It is important for you to complete the blood thinner medication as prescribed. ? ?PRECAUTIONS:  If you experience chest pain or shortness of breath - call 911 immediately for transfer to the hospital emergency department.  ? ?If you develop a fever greater that 101 F, purulent drainage from wound, increased redness or drainage from wound, foul odor from the wound/dressing, or calf pain - CONTACT YOUR SURGEON.   ?                                                ?FOLLOW-UP APPOINTMENTS:  If you do not already have a post-op appointment, please call the office for an appointment to be seen by your surgeon.  Guidelines for how soon to be seen are listed in your ?After Visit Summary?, but are typically between 1-4 weeks after surgery. ? ?OTHER INSTRUCTIONS:  ? ?Knee Replacement:  Do not place pillow  under knee, focus on keeping the knee straight while resting. CPM instructions: 0-90 degrees, 2 hours in the morning, 2 hours in the afternoon, and 2 hours in the evening. Place foam block, curve side up un

## 2021-09-21 NOTE — Evaluation (Signed)
Physical Therapy Evaluation ?Patient Details ?Name: [name] ?MRN: [id number] ?DOB: 05/19/1956 ?Today's Date: 09/21/2021 ? ?History of Present Illness ? Pt is a 66 y.o. female s/p elective R THA on 09/21/21. PMH includes L TKA (2020), HTN, arthritis, prediabetes, rheumatoid arthritis. ?  ?Clinical Impression ? Pt presents with an overall decrease in functional mobility secondary to above. PTA, pt mod indep with SPC, works, lives alone; plans to d/c to daughter's home for increased assist. Initiated educ re: precautions, positioning, therex, and importance of mobility. Today, pt able to initiate transfer and gait training with RW and min guard. Pt would benefit from continued acute PT services to maximize functional mobility and independence prior to d/c home.     ? ?Recommendations for follow up therapy are one component of a multi-disciplinary discharge planning process, led by the attending physician.  Recommendations may be updated based on patient status, additional functional criteria and insurance authorization. ? ?Follow Up Recommendations Follow physician's recommendations for discharge plan and follow up therapies ? ?  ?Assistance Recommended at Discharge Intermittent Supervision/Assistance  ?Patient can return home with the following ? A little help with bathing/dressing/bathroom;Assistance with cooking/housework;Assist for transportation;Help with stairs or ramp for entrance ? ?  ?Equipment Recommendations Youth-sized rolling walker (2 wheels); BSC/3in1  ?Recommendations for Other Services ?    ?  ?Functional Status Assessment Patient has had a recent decline in their functional status and demonstrates the ability to make significant improvements in function in a reasonable and predictable amount of time.  ? ?  ?Precautions / Restrictions Precautions ?Precautions: Fall ?Restrictions ?Weight Bearing Restrictions: Yes ?RLE Weight Bearing: Weight bearing as tolerated  ? ?  ? ?Mobility ? Bed Mobility ?Overal  bed mobility: Needs Assistance ?Bed Mobility: Supine to Sit ?  ?  ?Supine to sit: Min assist ?  ?  ?General bed mobility comments: MinA for RLE management ?  ? ?Transfers ?Overall transfer level: Needs assistance ?Equipment used: Rolling walker (2 wheels) ?Transfers: Sit to/from Stand ?Sit to Stand: Min guard ?  ?  ?  ?  ?  ?General transfer comment: Multiple sit<>stands to RW from EOB and recliner with min guard for balance; cues for hand placement and sequencing ?  ? ?Ambulation/Gait ?Ambulation/Gait assistance: Min guard ?Gait Distance (Feet): 12 Feet ?Assistive device: Rolling walker (2 wheels) ?Gait Pattern/deviations: Step-through pattern, Decreased stride length, Trunk flexed, Antalgic, Decreased weight shift to right ?Gait velocity: Decreased ?  ?Pre-gait activities: initial marching in place at EOB with RW and min guard ?General Gait Details: Initial pivotal steps to recliner with RW and min guard, seated rest, then additional 8' forwards and 8' backwards; slow, antalgic gait but demonstrates good foot clearance and step length for amount of pain ? ?Stairs ?  ?  ?  ?  ?  ? ?Wheelchair Mobility ?  ? ?Modified Rankin (Stroke Patients Only) ?  ? ?  ? ?Balance Overall balance assessment: Needs assistance ?  ?Sitting balance-Leahy Scale: Fair ?  ?  ?Standing balance support: Reliant on assistive device for balance ?Standing balance-Leahy Scale: Poor ?  ?  ?  ?  ?  ?  ?  ?  ?  ?  ?  ?  ?   ? ? ? ?Pertinent Vitals/Pain Pain Assessment ?Pain Assessment: Faces ?Faces Pain Scale: Hurts even more ?Pain Location: R hip ?Pain Descriptors / Indicators: Grimacing, Guarding ?Pain Intervention(s): Monitored during session, Premedicated before session, Ice applied  ? ? ?Home Living Family/patient expects to be discharged to::  Private residence ?Living Arrangements: Alone ?Available Help at Discharge: Family;Available 24 hours/day ?Type of Home: House ?Home Access: Stairs to enter ?Entrance Stairs-Rails: Right ?Entrance  Stairs-Number of Steps: 2-3 ?  ?Home Layout: One level ?Home Equipment: Gilmer Mor - single point ?Additional Comments: Lives alone in [place]; will d/c to daughter's home in [place], [place] - two daughters available to assist  ?  ?Prior Function Prior Level of Function : Independent/Modified Independent ?  ?  ?  ?  ?  ?  ?Mobility Comments: Mod indep with intermittent use of SPC recently due to worsening pain. Works housekeeping at hotel ?  ?  ? ? ?Hand Dominance  ?   ? ?  ?Extremity/Trunk Assessment  ? Upper Extremity Assessment ?Upper Extremity Assessment: Overall WFL for tasks assessed ?  ? ?Lower Extremity Assessment ?Lower Extremity Assessment: RLE deficits/detail ?RLE Deficits / Details: s/p R THA with expected post-op pain and weakness; able to perform full knee ext against gravity, unable to perform full SLR ?RLE: Unable to fully assess due to pain ?RLE Coordination: decreased gross motor ?  ? ?Cervical / Trunk Assessment ?Cervical / Trunk Assessment: Normal  ?Communication  ? Communication: No difficulties  ?Cognition Arousal/Alertness: Awake/alert ?Behavior During Therapy: [facility] for tasks assessed/performed ?Overall Cognitive Status: Within Functional Limits for tasks assessed ?  ?  ?  ?  ?  ?  ?  ?  ?  ?  ?  ?  ?  ?  ?  ?  ?  ?  ?  ? ?  ?General Comments General comments (skin integrity, edema, etc.): C/o nausea with standing activity, denies dizziness. Daughter present at end of session ? ?  ?Exercises Total Joint Exercises ?Ankle Circles/Pumps: AROM, Both, Seated ?Long Arc Quad: AROM, Right, Seated ?Other Exercises ?Other Exercises: Hip therex handout provided  ? ?Assessment/Plan  ?  ?PT Assessment Patient needs continued PT services  ?PT Problem List Decreased strength;Decreased range of motion;Decreased activity tolerance;Decreased balance;Decreased mobility;Decreased knowledge of use of DME;Decreased knowledge of precautions;Pain ? ?   ?  ?PT Treatment Interventions DME instruction;Gait training;Stair  training;Functional mobility training;Therapeutic activities;Therapeutic exercise;Balance training;Patient/family education   ? ?PT Goals (Current goals can be found in the Care Plan section)  ?Acute Rehab PT Goals ?Patient Stated Goal: Hopeful for d/c tomorrow if able ?PT Goal Formulation: With patient ?Time For Goal Achievement: 10/05/21 ?Potential to Achieve Goals: Good ? ?  ?Frequency 7X/week ?  ? ? ?Co-evaluation   ?  ?  ?  ?  ? ? ?  ?AM-PAC PT "6 Clicks" Mobility  ?Outcome Measure Help needed turning from your back to your side while in a flat bed without using bedrails?: A Little ?Help needed moving from lying on your back to sitting on the side of a flat bed without using bedrails?: A Little ?Help needed moving to and from a bed to a chair (including a wheelchair)?: A Little ?Help needed standing up from a chair using your arms (e.g., wheelchair or bedside chair)?: A Little ?Help needed to walk in hospital room?: A Little ?Help needed climbing 3-5 steps with a railing? : A Little ?6 Click Score: 18 ? ?  ?End of Session Equipment Utilized During Treatment: Gait belt ?Activity Tolerance: Patient tolerated treatment well ?Patient left: in chair;with call bell/phone within reach;with family/visitor present ?Nurse Communication: Mobility status ?PT Visit Diagnosis: Other abnormalities of gait and mobility (R26.89);Pain ?Pain - Right/Left: Right ?Pain - part of body: Hip ?  ? ?Time: 8938-1017 ?PT Time Calculation (min) (  ACUTE ONLY): 23 min ? ? ?Charges:   PT Evaluation ?$PT Eval Low Complexity: 1 Low ?  ?  ?   ?Ina Homes, PT, DPT ?Acute Rehabilitation Services  ?Pager [phone] ?Office [phone] ? ?Malachy Chamber ?09/21/2021, 5:22 PM ? ?

## 2021-09-21 NOTE — H&P (Signed)
? ? ?PREOPERATIVE H&P ? ?Chief Complaint: RIGHT HIP DJD ? ?HPI: ?[name] is a 66 y.o. female who presents for surgical treatment of RIGHT HIP DJD.  She denies any changes in medical history. ? ?Past Medical History:  ?Diagnosis Date  ? Acid reflux   ? Per records from [place]  ? Alcohol use   ? Per Records received from [facility]- pt states she only has a Jaster of wine occasionally  ? Arthritis   ? Knee and hip  ? BMI 36.0-36.9,adult   ? Per [facility] received from [facility]  ? Chronic knee pain   ? L & R  ? GERD (gastroesophageal reflux disease)   ? Heart murmur, systolic [id number]  ? 1/6 systolic  ? Hyperlipidemia   ? Hypertension   ? Morbid obesity ([place])   ? Per Records received from [facility]  ? Other osteoarthritis involving multiple joints   ? Per Records received from [facility]  ? Prediabetes   ? Per Records received from [facility]  ? 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([place])   ? Per records from [place]  ? Right hip pain   ? Per records from [place]  ? Right thigh pain 2022  ? Per records from [place]  ? Vitamin D deficiency   ? Per Records received from [facility]  ? ?Past Surgical History:  ?Procedure Laterality Date  ? JOINT REPLACEMENT Left 07/11/2018  ? knee replacement  ? TOE SURGERY Right   ? bone spur "shaved" down- many years ago  ? TUBAL LIGATION  1978  ? Per records from [place]  ? ?Social History  ? ?Socioeconomic History  ? Marital status: Divorced  ?  Spouse name: Not on file  ? Number of children: 4  ? Years of education: Not on file  ? Highest education level: Not on file  ?Occupational History  ? Not on file  ?Tobacco Use  ? Smoking status: Never  ? Smokeless tobacco: Never  ?Vaping Use  ? Vaping Use: Never used  ?Substance and Sexual Activity  ? Alcohol use: Yes  ?  Comment: pt states she may have 1 Bloodsaw of wine on special occasions  ? Drug  use: No  ? Sexual activity: Not on file  ?Other Topics Concern  ? Not on file  ?Social History Narrative  ? Diet: none  ?   ? Caffeine: yes  ?   ? Married, if yes what year: Divorced, 1972  ?   ? Do you live in a house, apartment, assisted living, condo, trailer, ect: Apartment  ?   ? Is it one or more stories:   ?   ? How many persons live in your home? 1  ?   ? Pets: yes ( shih-tzu)  ?   ? Highest level or education completed: 10th  ?   ? Current/Past profession:  ?   ? Exercise:  No                Type and how often:   ?   ?   ? Living Will: No    ? DNR: No    ? POA/HPOA: No    ?   ? Functional Status:  ? Do you have difficulty bathing or dressing yourself? No    ? Do you have difficulty preparing food or eating?  No    ? Do you have difficulty managing your medications? No    ? Do you have difficulty managing your finances? No    ? Do you have difficulty affording your medications?  No    ? ?Social Determinants of Health  ? ?Financial Resource Strain: Not on file  ?Food Insecurity: Not on file  ?Transportation Needs: Not on file  ?Physical Activity: Not on file  ?Stress: Not on file  ?Social Connections: Not on file  ? ?Family History  ?Problem Relation Age of Onset  ? Hypertension Mother   ?     Per records from [place]  ? Cancer Mother 3  ?     colon  ? Colon cancer Mother   ?     Per records from [place]  ? Cancer Father 1  ?     lung  ? Hypertension Father   ?     Per records from [place]  ? Lung disease Father   ?     Per records from [place]  ? Seizures Brother   ? Epilepsy Brother   ? Drug abuse Daughter   ? Other Daughter   ?     Murder in 2014  ? ?Allergies  ?Allergen Reactions  ? Aspirin Other (See Comments)  ?  Acid reflux  ? Ibuprofen Other (See Comments)  ?  Acid reflux ?  ? ?Prior to Admission medications   ?Medication Sig Start Date End Date Taking? Authorizing Provider   ?acetaminophen (TYLENOL) 500 MG tablet Take 1,000 mg by mouth every 6 (six) hours as needed for moderate pain.   Yes [provider]  ?aspirin EC 81 MG tablet Take 1 tablet (81 mg total) by mouth 2 (two) times daily. To be taken after surgery 09/20/21   Aundra Dubin, PA-C  ?CALCIUM-VITAMIN D PO Take 1 tablet by mouth daily.   Yes [provider]  ?docusate sodium (COLACE) 100 MG capsule Take 1 capsule (100 mg total) by mouth daily as needed. 09/19/21 09/19/22  Aundra Dubin, PA-C  ?HYDROcodone-acetaminophen (NORCO) 5-325 MG tablet Take 1 tablet by mouth 2 (two) times daily as needed. 07/27/21  Yes Aundra Dubin, PA-C  ?lisinopril-hydrochlorothiazide (ZESTORETIC) 20-12.5 MG tablet Take 1 tablet by mouth daily.   Yes [provider]  ?methocarbamol (ROBAXIN) 500 MG tablet Take 1 tablet (500 mg total) by mouth 2 (two) times daily as needed. 09/19/21   Aundra Dubin, PA-C  ?Multiple Vitamin (MULTIVITAMIN WITH MINERALS) TABS tablet Take 1 tablet by mouth daily.   Yes [provider]  ?ondansetron (ZOFRAN) 4 MG tablet Take 1 tablet (4 mg total) by mouth every 8 (eight) hours as needed for nausea or vomiting. 09/19/21   Aundra Dubin, PA-C  ?oxyCODONE-acetaminophen (PERCOCET) 5-325 MG tablet Take 1-2 tablets by mouth every 6 (six) hours as needed. To be taken after surgery 09/19/21   Aundra Dubin, PA-C  ?rivaroxaban (XARELTO) 10 MG TABS tablet Take 1 tablet (10 mg total) by mouth daily. To be taken after surgery to prevent blood clots 09/19/21   Aundra Dubin, PA-C  ?rosuvastatin (CRESTOR) 5 MG tablet Take 5 mg by mouth at bedtime. 04/17/19  Yes [provider]  ?acetaminophen-codeine (TYLENOL #3) 300-30 MG tablet Take 1 tablet by mouth every 8 (eight) hours as needed for moderate pain. ?Patient not taking: Reported on 09/15/2021  04/14/21   Aundra Dubin, PA-C  ? ? ? ?Positive ROS: All other systems have been reviewed and were otherwise negative with the exception  of those mentioned in the HPI and as above. ? ?Physical Exam: ?General: Alert, no acute distress ?Cardiovascular: No pedal edema ?Respiratory: No cyanosis, no use of accessory musculature ?GI: abdomen soft ?Skin: No lesions in the area of chief complaint ?Neurologic: Sensation intact distally ?Psychiatric: Patient is competent for consent with normal mood and affect ?Lymphatic: no lymphedema ? ?MUSCULOSKELETAL: exam stable ? ?Assessment: ?RIGHT HIP DJD ? ?Plan: ?Plan for Procedure(s): ?RIGHT TOTAL HIP ARTHROPLASTY ANTERIOR APPROACH ? ?The risks benefits and alternatives were discussed with the patient including but not limited to the risks of nonoperative treatment, versus surgical intervention including infection, bleeding, nerve injury,  blood clots, cardiopulmonary complications, morbidity, mortality, among others, and they were willing to proceed.  ? ?Preoperative templating of the joint replacement has been completed, documented, and submitted to the Operating Room personnel in order to optimize intra-operative equipment management. ? ? ?Eduard Roux, MD ?09/21/2021 ?9:41 AM ? ?

## 2021-09-21 NOTE — Care Plan (Signed)
(  Late entry note for 09/15/21). [name] met with patient in office during her appointment with Dr. Erlinda Hong prior to surgery. She will be having a Right total hip arthroplasty on 09/21/21. She is an Ortho bundle patient through [facility] and is agreeable to case management. She lives alone and has put off surgery once before because she had no one to help her. Her current plan is to discharge to her daughter's home in [place], [place] to recover there. Valdez spoke with her daughter [name] while in office today and she confirmed she is off work on 3/16, 3/17, and 09/24/21 and she will be available to pick her mother up at discharge and take her to [place] to stay with her. There is a bedroom on the first floor with a bathroom for her. She will need a RW and 3in1/BSC. These will be ordered through [employer] at the hospital for her to take home with her. Anticipate HHPT will be needed and CM has made referral to [facility] in [place]. Have not yet heard back regarding accepting patient. Will check with them today. Reviewed all post op care instructions. Will continue to follow for needs. ?

## 2021-09-21 NOTE — Anesthesia Procedure Notes (Signed)
Procedure Name: Agency ?Date/Time: 09/21/2021 11:31 AM ?Performed by: Janace Litten, CRNA ?Pre-anesthesia Checklist: Patient identified, Emergency Drugs available, Suction available and Patient being monitored ?Patient Re-evaluated:Patient Re-evaluated prior to induction ?Oxygen Delivery Method: Simple face mask ? ? ? ? ?

## 2021-09-21 NOTE — Op Note (Signed)
? ?RIGHT TOTAL HIP ARTHROPLASTY ANTERIOR APPROACH  Procedure Note ?[name]   [id number] ? ?Pre-op Diagnosis: RIGHT HIP DJD ?    ?Post-op Diagnosis: same ?  ?Operative Procedures  ?1. Total hip replacement; Right hip; uncemented [id number]  ? ?Surgeon: Gershon Mussel, M.D. ? ?Assist: Oneal Grout, PA-C ?  ?Anesthesia: spinal ? ?Prosthesis: Depuy ?Acetabulum: Pinnacle 50 mm ?Femur: Actis 1 HO ?Head: 32 mm size: +1 ?Liner: +4 ?Bearing Type: ceramic/poly ? ?Total Hip Arthroplasty (Anterior Approach) Op Note:  ?After informed consent was obtained and the operative extremity marked in the holding area, the patient was brought back to the operating room and placed supine on the HANA table. Next, the operative extremity was prepped and draped in normal sterile fashion. Surgical timeout occurred verifying patient identification, surgical site, surgical procedure and administration of antibiotics.  ?A modified anterior Smith-Peterson approach to the hip was performed, using the interval between tensor fascia lata and sartorius.  Dissection was carried bluntly down onto the anterior hip capsule. The lateral femoral circumflex vessels were identified and coagulated. A capsulotomy was performed and the capsular flaps tagged for later repair.  The neck osteotomy was performed. The femoral head was removed which showed severe degenerative wear, the acetabular rim was cleared of soft tissue and attention was turned to reaming the acetabulum.  ?Sequential reaming was performed under fluoroscopic guidance. We reamed to a size 49 mm, and then impacted the acetabular shell.  The liner was then placed after irrigation and attention turned to the femur.  ?After placing the femoral hook, the leg was taken to externally rotated, extended and adducted position taking care to perform soft tissue releases to allow for adequate mobilization of the femur. Soft tissue was cleared from the shoulder of the greater trochanter and the hook  elevator used to improve exposure of the proximal femur. Sequential broaching performed up to a size 1. Trial neck and head were placed. The leg was brought back up to neutral and the construct reduced.  Antibiotic irrigation was placed in the surgical wound and kept for at least 1 minute.  The position and sizing of components, offset and leg lengths were checked using fluoroscopy. Stability of the construct was checked in extension and external rotation without any subluxation or impingement of prosthesis. We dislocated the prosthesis, dropped the leg back into position, removed trial components, and irrigated copiously. The final stem and head was then placed, the leg brought back up, the system reduced and fluoroscopy used to verify positioning.  ?We irrigated, obtained hemostasis and closed the capsule using #2 ethibond suture.  One gram of vancomycin powder was placed in the surgical bed.   One gram of topical tranexamic acid was injected into the joint.  The fascia was closed with #1 vicryl plus, the deep fat layer was closed with 0 vicryl, the subcutaneous layers closed with 2.0 Vicryl Plus and the skin closed with 2.0 nylon and dermabond. A sterile dressing was applied. The patient was awakened in the operating room and taken to recovery in stable condition.  ?All sponge, needle, and instrument counts were correct at the end of the case.  ? ?Tessa Lerner, my PA, was a medical necessity for opening, closing, limb positioning, retracting, exposing, and overall facilitation and timely completion of the surgery. ? ?Position: supine  ?Complications: see description of procedure.  ?Time Out: performed  ? ?Drains/Packing: none ? ?Estimated blood loss: see anesthesia record ? ?Returned to Recovery Room: in good condition.  ? ?  Antibiotics: yes  ? ?Mechanical VTE (DVT) Prophylaxis: sequential compression devices, TED thigh-high  ?Chemical VTE (DVT) Prophylaxis: aspirin  ? ?Fluid Replacement: see anesthesia  record ? ?Specimens Removed: 1 to pathology  ? ?Sponge and Instrument Count Correct? yes  ? ?PACU: portable radiograph - low AP  ? ?Plan/RTC: Return in 2 weeks for staple removal. ?Weight Bearing/Load Lower Extremity: full  ?Hip precautions: none ?Suture Removal: 2 weeks  ? ?N. Glee Arvin, MD ?Aldean Baker ?12:52 PM ? ? ?Implant Name Type Inv. Item Serial No. Manufacturer Lot No. LRB No. Used Action  ?CUP SECTOR GRIPTON - [id number] Cup CUP SECTOR GRIPTON  [employer] [id number] Right 1 Implanted  ?PINNACLE PLUS 4 NEUTRAL - [id number] Hips PINNACLE PLUS 4 NEUTRAL  DEPU[employer]TH[employer] [id number] Right 1 Implanted  ?STEM FEM ACTIS HIGH SZ1 - [id number] Stem STEM FEM ACTIS HIGH SZ1  [employer]PU[employer] [id number] Right 1 Implanted  ?HEAD FEMORAL 32 CERAMIC - [id number] Hips HEAD FEMORAL 32 CERAMIC  [employer] [id number] Right 1 Implanted  ? ? ?

## 2021-09-21 NOTE — Telephone Encounter (Signed)
Ortho bundle Pre-op calls completed. ?

## 2021-09-21 NOTE — Anesthesia Postprocedure Evaluation (Signed)
Anesthesia Post Note ? ?Patient: [name] ? ?Procedure(s) Performed: RIGHT TOTAL HIP ARTHROPLASTY ANTERIOR APPROACH (Right: Hip) ? ?  ? ?Patient location during evaluation: PACU ?Anesthesia Type: Spinal ?Level of consciousness: oriented and awake and alert ?Pain management: pain level controlled ?Vital Signs Assessment: post-procedure vital signs reviewed and stable ?Respiratory status: spontaneous breathing, respiratory function stable and patient connected to nasal cannula oxygen ?Cardiovascular status: blood pressure returned to baseline and stable ?Postop Assessment: no headache, no backache and no apparent nausea or vomiting ?Anesthetic complications: no ? ? ?No notable events documented. ? ?Last Vitals:  ?Vitals:  ? 09/21/21 1430 09/21/21 1445  ?BP: (!) 182/79 (!) 173/74  ?Pulse: (!) 44 (!) 57  ?Resp: 12 18  ?Temp:    ?SpO2: 99% 99%  ?  ?Last Pain:  ?Vitals:  ? 09/21/21 1430  ?TempSrc:   ?PainSc: 6   ? ? ?  ?  ?  ?  ?L Sensory Level: L5-Outer lower leg, top of foot, great toe (09/21/21 1445) ?R Sensory Level: L5-Outer lower leg, top of foot, great toe (09/21/21 1445) ? ?[name] ? ? ? ? ?

## 2021-09-21 NOTE — Care Plan (Signed)
Ortho Bundle Case Management Note ? ?Patient Details  ?Name: [name] ?MRN: [id number] ?Date of Birth: 03-02-1956 ? ? [facility] RNCM spoke with patient in office prior to her Right total hip arthroplasty with Dr. Roda Shutters on 09/21/21. She is an Ortho bundle patient through [facility] and is agreeable to case management. She lives alone here in [place], but will going to stay with her daughter, Misty Stanley, who lives in the [place] area. CM has spoken with daughter who anticipates discharge on Thursday, 09/22/21 and will be coming to pick her mother up. HHPT has been set up with [facility] [place], [place]. Patient will need a RW and 3in1/BSC prior to discharge. Will request assistance from [facility] nursing staff for this through [facility]. Will continue to follow for needs.               ? ? ? ?DME Arranged:  3-N-1, Walker rolling ?DME Agency:  [facility] ? ?HH Arranged:  PT ?HH Agency:  [facility] ? ?Additional Comments: ?Please contact me with any questions of if this plan should need to change. ? ?Ralph Dowdy, RN, BSN, [facility]  [phone] ?09/21/2021, 4:54 PM ?  ?

## 2021-09-21 NOTE — Anesthesia Procedure Notes (Addendum)
Spinal ? ?Patient location during procedure: OR ?Start time: 09/21/2021 11:30 AM ?End time: 09/21/2021 11:34 AM ?Reason for block: surgical anesthesia ?Staffing ?Performed: anesthesiologist  ?Anesthesiologist: Elmer Picker, MD ?Preanesthetic Checklist ?Completed: patient identified, IV checked, risks and benefits discussed, surgical consent, monitors and equipment checked, pre-op evaluation and timeout performed ?Spinal Block ?Patient position: sitting ?Prep: DuraPrep and site prepped and draped ?Patient monitoring: cardiac monitor, continuous pulse ox and blood pressure ?Approach: midline ?Location: L3-4 ?Injection technique: single-shot ?Needle ?Needle type: Pencan  ?Needle gauge: 24 G ?Needle length: 9 cm ?Assessment ?Sensory level: T6 ?Events: CSF return ?Additional Notes ?Functioning IV was confirmed and monitors were applied. Sterile prep and drape, including hand hygiene and sterile gloves were used. The patient was positioned and the spine was prepped. The skin was anesthetized with lidocaine.  Free flow of clear CSF was obtained prior to injecting local anesthetic into the CSF.  The spinal needle aspirated freely following injection.  The needle was carefully withdrawn.  The patient tolerated the procedure well.  ? ? ? ?

## 2021-09-22 ENCOUNTER — Encounter (HOSPITAL_COMMUNITY): Payer: Self-pay | Admitting: Orthopaedic Surgery

## 2021-09-22 DIAGNOSIS — Z79899 Other long term (current) drug therapy: Secondary | ICD-10-CM | POA: Diagnosis not present

## 2021-09-22 DIAGNOSIS — M1611 Unilateral primary osteoarthritis, right hip: Secondary | ICD-10-CM | POA: Diagnosis not present

## 2021-09-22 DIAGNOSIS — Z96652 Presence of left artificial knee joint: Secondary | ICD-10-CM | POA: Diagnosis not present

## 2021-09-22 DIAGNOSIS — I10 Essential (primary) hypertension: Secondary | ICD-10-CM | POA: Diagnosis not present

## 2021-09-22 DIAGNOSIS — Z7982 Long term (current) use of aspirin: Secondary | ICD-10-CM | POA: Diagnosis not present

## 2021-09-22 LAB — CBC
HCT: 31.4 % — ABNORMAL LOW (ref 36.0–46.0)
Hemoglobin: 10.1 g/dL — ABNORMAL LOW (ref 12.0–15.0)
MCH: 30.1 pg (ref 26.0–34.0)
MCHC: 32.2 g/dL (ref 30.0–36.0)
MCV: 93.5 fL (ref 80.0–100.0)
Platelets: 177 10*3/uL (ref 150–400)
RBC: 3.36 MIL/uL — ABNORMAL LOW (ref 3.87–5.11)
RDW: 12.5 % (ref 11.5–15.5)
WBC: 10 10*3/uL (ref 4.0–10.5)
nRBC: 0 % (ref 0.0–0.2)

## 2021-09-22 LAB — BASIC METABOLIC PANEL
Anion gap: 8 (ref 5–15)
BUN: 10 mg/dL (ref 8–23)
CO2: 27 mmol/L (ref 22–32)
Calcium: 8.5 mg/dL — ABNORMAL LOW (ref 8.9–10.3)
Chloride: 103 mmol/L (ref 98–111)
Creatinine, Ser: 0.68 mg/dL (ref 0.44–1.00)
GFR, Estimated: >60 mL/min (ref >60)
Glucose, Bld: 120 mg/dL — ABNORMAL HIGH (ref 70–99)
Potassium: 3.3 mmol/L — ABNORMAL LOW (ref 3.5–5.1)
Sodium: 138 mmol/L (ref 135–145)

## 2021-09-22 NOTE — TOC Progression Note (Addendum)
Transition of Care (TOC) - Progression Note  ? ? ?Patient Details  ?Name: [name] ?MRN: [id number] ?Date of Birth: 1955-10-07 ? ?Transition of Care (TOC) CM/SW Contact  ?Kingsley Plan, RN ?Phone Number: ?09/22/2021, 9:53 AM ? ?Clinical Narrative:    ? ?Received a call from [facility] of [place], [place]. Dr Roda Shutters office referred patient to them for HHPT, however they need orders and face to face for HHPT.  ? ?NCM secure chatted Ralph Dowdy, bedside nurse and called DR Xu's office and left voicemail  ? ? ? ?  ?  ? ?Expected Discharge Plan and Services ?  ?  ?  ?  ?  ?Expected Discharge Date: 09/22/21               ?DME Arranged: 3-N-1, Walker rolling ?DME Agency: [facility] ?  ?  ?  ?HH Arranged: PT ?HH Agency: [facility] [facility] ?  ?  ?  ? ? ?Social Determinants of Health (SDOH) Interventions ?  ? ?Readmission Risk Interventions ?No flowsheet data found. ? ?

## 2021-09-22 NOTE — Progress Notes (Signed)
Subjective: ?1 Day Post-Op Procedure(s) (LRB): ?RIGHT TOTAL HIP ARTHROPLASTY ANTERIOR APPROACH (Right) ?Patient reports pain as mild.  Doing great ? ? ?Objective: ?Vital signs in last 24 hours: ?Temp:  [97.8 ?F (36.6 ?C)-98.6 ?F (37 ?C)] 98.5 ?F (36.9 ?C) (03/16 2641) ?Pulse Rate:  [40-72] 68 (03/16 0742) ?Resp:  [8-18] 18 (03/16 0742) ?BP: (126-184)/(57-88) 140/63 (03/16 0742) ?SpO2:  [96 %-100 %] 96 % (03/16 0742) ?Weight:  [72.1 kg] 72.1 kg (03/15 0954) ? ?Intake/Output from previous day: ?03/15 0701 - 03/16 0700 ?In: 1500 [I.V.:1400; IV Piggyback:100] ?Out: 675 [Urine:475; Blood:200] ?Intake/Output this shift: ?No intake/output data recorded. ? ?Recent Labs  ?  09/19/21 ?1003 09/22/21 ?5830  ?HGB 12.3 10.1*  ? ?Recent Labs  ?  09/19/21 ?1003 09/22/21 ?9407  ?WBC 8.1 10.0  ?RBC 4.05 3.36*  ?HCT 38.1 31.4*  ?PLT 274 177  ? ?Recent Labs  ?  09/19/21 ?1003 09/22/21 ?6808  ?NA 138 138  ?K 3.3* 3.3*  ?CL 101 103  ?CO2 29 27  ?BUN 18 10  ?CREATININE 0.86 0.68  ?GLUCOSE 89 120*  ?CALCIUM 9.3 8.5*  ? ?No results for input(s): LABPT, INR in the last 72 hours. ? ?Neurologically intact ?Neurovascular intact ?Sensation intact distally ?Intact pulses distally ?Dorsiflexion/Plantar flexion intact ?Incision: dressing C/D/I ?No cellulitis present ?Compartment soft ? ? ?Assessment/Plan: ?1 Day Post-Op Procedure(s) (LRB): ?RIGHT TOTAL HIP ARTHROPLASTY ANTERIOR APPROACH (Right) ?Advance diet ?Up with therapy ?D/C IV fluids ?WBAT RLE ?ABLA- mild and stable ?D/c home after first or second PT session depending on progression with mobility ? ? ? ? ?Cristie Hem ?09/22/2021, 8:06 AM ? ?

## 2021-09-22 NOTE — Discharge Summary (Signed)
Patient ID: ?[name] ?MRN: [id number] ?DOB/AGE: 13-Dec-1955 66 y.o. ? ?Admit date: 09/21/2021 ?Discharge date: 09/22/2021 ? ?Admission Diagnoses:  ?Principal Problem: ?  Primary osteoarthritis of right hip ?Active Problems: ?  Status post total replacement of right hip ? ? ?Discharge Diagnoses:  ?Same ? ?Past Medical History:  ?Diagnosis Date  ? Acid reflux   ? Per records from [facility] the [facility]  ? Alcohol use   ? Per Records received from [facility]- pt states she only has a Cochrane of wine occasionally  ? Arthritis   ? Knee and hip  ? BMI 36.0-36.9,adult   ? Per Records received from [facility]  ? Chronic knee pain   ? L & R  ? GERD (gastroesophageal reflux disease)   ? Heart murmur, systolic 09/19/2021  ? 1/6 systolic  ? Hyperlipidemia   ? Hypertension   ? Morbid obesity (HCC)   ? Per Records received from [facility]  ? Other osteoarthritis involving multiple joints   ? Per Records received from [facility]  ? Prediabetes   ? Per Records received from [facility]  ? 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(HCC)   ? Per records from [facility] the [facility]  ? Right hip pain   ? Per records from [facility] of the [facility]  ? Right thigh pain 2022  ? Per records from [facility] of the [facility]  ? Vitamin D deficiency   ? Per Records received from [facility]  ? ? ?Surgeries: Procedure(s): ?RIGHT TOTAL HIP ARTHROPLASTY ANTERIOR APPROACH on 09/21/2021 ?  ?Consultants:  ? ?Discharged Condition: Improved ? ?Hospital Course: [name] is an 66 y.o. female who was admitted 09/21/2021 for operative treatment ofPrimary osteoarthritis of right hip. Patient has severe unremitting pain that affects sleep, daily activities, and work/hobbies. After pre-op clearance the patient was taken to the operating room on 09/21/2021 and underwent  Procedure(s): ?RIGHT TOTAL HIP ARTHROPLASTY ANTERIOR APPROACH.   ? ?Patient was given perioperative antibiotics:  ?Anti-infectives (From admission, onward)   ? ? Start     Dose/Rate Route Frequency Ordered Stop  ? 09/21/21 1800  ceFAZolin (ANCEF) IVPB 2g/100 mL premix       ? 2 g ?200 mL/hr over 30 Minutes Intravenous Every 6 hours 09/21/21 1616 09/21/21 2337  ? 09/21/21 1245  vancomycin (VANCOCIN) powder  Status:  Discontinued       ?   As needed 09/21/21 1245 09/21/21 1318  ? 09/21/21 0945  ceFAZolin (ANCEF) IVPB 2g/100 mL premix       ? 2 g ?200 mL/hr over 30 Minutes Intravenous On call to O.R. 09/21/21 0937 09/21/21 1136  ? ?  ?  ? ?Patient was given sequential compression devices, early ambulation, and chemoprophylaxis to prevent DVT. ? ?Patient benefited maximally from hospital stay and there were no complications.   ? ?Recent vital signs: Patient Vitals for the past 24 hrs: ? BP Temp Temp src Pulse Resp SpO2 Height Weight  ?09/22/21 0742 140/63 98.5 ?F (36.9 ?C) Oral 68 18 96 % -- --  ?09/22/21 0353 (!) 126/57 98.4 ?F (36.9 ?C) Oral 71 18 99 % -- --  ?09/21/21 1936 (!) 155/88 98.6 ?F (37 ?C) Oral 60 18 100 % -- --  ?09/21/21 1629 (!) 174/66 -- -- (!) 52 18 100 % -- --  ?09/21/21 1600 (!) 175/68 97.8 ?F (36.6 ?C) -- 63 18 100 % -- --  ?09/21/21 1545 (!) 173/66 -- -- Marland Kitchen 46  13 100 % -- --  ?09/21/21 1530 (!) 179/69 -- -- (!) 46 17 98 % -- --  ?09/21/21 1515 (!) 175/73 -- -- (!) 44 12 97 % -- --  ?09/21/21 1500 (!) 175/74 -- -- (!) 45 10 100 % -- --  ?09/21/21 1445 (!) 173/74 -- -- (!) 57 18 99 % -- --  ?09/21/21 1430 (!) 182/79 -- -- (!) 44 12 99 % -- --  ?09/21/21 1415 (!) 184/68 -- -- (!) 40 (!) 8 100 % -- --  ?09/21/21 1400 (!) 179/66 -- -- (!) 41 13 100 % -- --  ?09/21/21 1330 (!) 151/59 -- -- (!) 47 14 99 % -- --  ?09/21/21 1315 131/67 97.8 ?F (36.6 ?C) -- (!) 55 15 97 % -- --  ?09/21/21 0954 (!) 142/63 98.3 ?F (36.8 ?C) Oral 72 17 96 % 4\' 11"  (1.499 m) 72.1 kg  ?  ? ?Recent laboratory studies:  ?Recent Labs  ?  09/19/21 ?1003 09/22/21 ?09/24/21  ?WBC 8.1 10.0  ?HGB 12.3 10.1*  ?HCT 38.1 31.4*  ?PLT 274 177  ?NA 138 138  ?K 3.3* 3.3*  ?CL 101 103  ?CO2 29 27  ?BUN  18 10  ?CREATININE 0.86 0.68  ?GLUCOSE 89 120*  ?CALCIUM 9.3 8.5*  ? ? ? ?Discharge Medications:   ?Allergies as of 09/22/2021   ? ?   Reactions  ? Aspirin Other (See Comments)  ? Acid reflux  ? Ibuprofen Other (See Comments)  ? Acid reflux  ? ?  ? ?  ?Medication List  ?  ? ?STOP taking these medications   ? ?acetaminophen 500 MG tablet ?Commonly known as: TYLENOL ?  ?acetaminophen-codeine 300-30 MG tablet ?Commonly known as: TYLENOL #3 ?  ?HYDROcodone-acetaminophen 5-325 MG tablet ?Commonly known as: Norco ?  ?rivaroxaban 10 MG Tabs tablet ?Commonly known as: XARELTO ?  ? ?  ? ?TAKE these medications   ? ?aspirin EC 81 MG tablet ?Take 1 tablet (81 mg total) by mouth 2 (two) times daily. To be taken after surgery ?  ?CALCIUM-VITAMIN D PO ?Take 1 tablet by mouth daily. ?  ?docusate sodium 100 MG capsule ?Commonly known as: Colace ?Take 1 capsule (100 mg total) by mouth daily as needed. ?  ?lisinopril-hydrochlorothiazide 20-12.5 MG tablet ?Commonly known as: ZESTORETIC ?Take 1 tablet by mouth daily. ?  ?methocarbamol 500 MG tablet ?Commonly known as: Robaxin ?Take 1 tablet (500 mg total) by mouth 2 (two) times daily as needed. ?  ?multivitamin with minerals Tabs tablet ?Take 1 tablet by mouth daily. ?  ?ondansetron 4 MG tablet ?Commonly known as: Zofran ?Take 1 tablet (4 mg total) by mouth every 8 (eight) hours as needed for nausea or vomiting. ?  ?oxyCODONE-acetaminophen 5-325 MG tablet ?Commonly known as: Percocet ?Take 1-2 tablets by mouth every 6 (six) hours as needed. To be taken after surgery ?  ?rosuvastatin 5 MG tablet ?Commonly known as: CRESTOR ?Take 5 mg by mouth at bedtime. ?  ? ?  ? ?  ?  ? ? ?  ?Durable Medical Equipment  ?(From admission, onward)  ?  ? ? ?  ? ?  Start     Ordered  ? 09/21/21 1617  DME Walker rolling  Once       ?Question:  Patient needs a walker to treat with the following condition  Answer:  History of hip replacement  ? 09/21/21 1616  ? 09/21/21 1617  DME 3 n 1  Once       ?  09/21/21  1616  ? 09/21/21 1617  DME Bedside commode  Once       ?Question:  Patient needs a bedside commode to treat with the following condition  Answer:  History of hip replacement  ? 09/21/21 1616  ? ?  ?  ? ?  ? ? ?Diagnostic Studies: DG Pelvis Portable ? ?Result Date: 09/21/2021 ?CLINICAL DATA:  Hip joint replacement EXAM: PORTABLE PELVIS 1-2 VIEWS COMPARISON:  Radiographs dated September 15, 2021 FINDINGS: Status post right hip arthroplasty with intact hardware. No periprosthetic fracture. Subcutaneous emphysema as expected. IMPRESSION: Status post right hip arthroplasty without evidence of complications. Electronically Signed   By: Larose HiresImran  Ahmed D.O.   On: 09/21/2021 15:00  ? ?DG Bone Density ? ?Result Date: 08/29/2021 ?EXAM: DUAL X-RAY ABSORPTIOMETRY (DXA) FOR BONE MINERAL DENSITY IMPRESSION: Referring Physician:  Sharon SellerJESSICA K EUBANKS Your patient completed a bone mineral density test using GE Lunar iDXA system (analysis version: 16). Technologist: SEC PATIENT: Name: Chryl HeckGlass, Catlyn Patient ID: [id number] Birth Date: [birth year] Height: 58.5 in. Sex: Female Measured: 08/29/2021 Weight: 156.0 lbs. Indications: Estrogen Deficient, Postmenopausal Fractures: NONE Treatments: Vitamin D (E933.5) ASSESSMENT: The BMD measured at AP Spine L1-L4 is 1.046 g/cm2 with a T-score of -1.1. This patient is considered osteopenic/low bone mass according to World Health Organization [facility](WHO) criteria. The quality of the exam is good. Site Region Measured Date Measured Age YA BMD Significant CHANGE T-score AP Spine L1-L4 08/29/2021 66.6 -1.1 1.046 g/cm2 DualFemur Neck Right 08/29/2021 66.6 -0.7 0.941 g/cm2 DualFemur Total Mean 08/29/2021 66.6 0.1 1.024 g/cm2 Left Forearm Radius 33% 08/29/2021 66.6 -0.1 0.868 g/cm2 World Health Organization [facility](WHO) criteria for post-menopausal, Caucasian Women: Normal       T-score at or above -1 SD Osteopenia   T-score between -1 and -2.5 SD Osteoporosis T-score at or below -2.5 SD RECOMMENDATION: 1. All patients should  optimize calcium and vitamin D intake. 2. Consider FDA-approved medical therapies in postmenopausal women and men aged 67 years and older, based on the following: a. A hip or vertebral (clinical or morphome

## 2021-09-22 NOTE — Progress Notes (Signed)
Physical Therapy Treatment ?Patient Details ?Name: [name] ?MRN: [id number] ?DOB: 12/23/1955 ?Today's Date: 09/22/2021 ? ? ?History of Present Illness Pt is a 66 y.o. female s/p elective R THA on 09/21/21. PMH includes L TKA (2020), HTN, arthritis, prediabetes, rheumatoid arthritis. ? ?  ?PT Comments  ? ? Patient progressing well towards physical therapy goals. Patient ambulating at supervision level with use of RW. Patient eager to participate with therapy and motivated to return to PLOF. Educated patient on HEP handout and provided demonstration on exercises, patient verbalized and demonstrated understanding. D/c plan remains appropriate.  ?  ?Recommendations for follow up therapy are one component of a multi-disciplinary discharge planning process, led by the attending physician.  Recommendations may be updated based on patient status, additional functional criteria and insurance authorization. ? ?Follow Up Recommendations ? Follow physician's recommendations for discharge plan and follow up therapies ?  ?  ?Assistance Recommended at Discharge Intermittent Supervision/Assistance  ?Patient can return home with the following A little help with bathing/dressing/bathroom;Assistance with cooking/housework;Assist for transportation;Help with stairs or ramp for entrance ?  ?Equipment Recommendations ? BSC/3in1; Youth-sized RW (2 wheels) ?  ?Recommendations for Other Services   ? ? ?  ?Precautions / Restrictions Precautions ?Precautions: Fall ?Restrictions ?Weight Bearing Restrictions: Yes ?RLE Weight Bearing: Weight bearing as tolerated  ?  ? ?Mobility ? Bed Mobility ?Overal bed mobility: Needs Assistance ?Bed Mobility: Supine to Sit ?  ?  ?Supine to sit: Supervision ?  ?  ?General bed mobility comments: able to utilize UEs to bring R LE to EOB. Supervision for safety but no physical assist required ?  ? ?Transfers ?Overall transfer level: Needs assistance ?Equipment used: Rolling Luwana Butrick (2 wheels) ?Transfers: Sit  to/from Stand ?Sit to Stand: Supervision ?  ?  ?  ?  ?  ?General transfer comment: supervision for safety ?  ? ?Ambulation/Gait ?Ambulation/Gait assistance: Supervision ?Gait Distance (Feet): 250 Feet ?Assistive device: Rolling Cheral Cappucci (2 wheels) ?Gait Pattern/deviations: Step-through pattern, Decreased stride length, Decreased stance time - right, Decreased weight shift to right, Antalgic ?Gait velocity: decreased ?  ?  ?General Gait Details: supervision for safety. Cues for pushing RW like shopping cart for more fluid gait pattern, good follow through ? ? ?Stairs ?  ?  ?  ?  ?  ? ? ?Wheelchair Mobility ?  ? ?Modified Rankin (Stroke Patients Only) ?  ? ? ?  ?Balance Overall balance assessment: Needs assistance ?Sitting-balance support: No upper extremity supported, Feet supported ?Sitting balance-Leahy Scale: Good ?  ?  ?Standing balance support: Bilateral upper extremity supported, Reliant on assistive device for balance ?Standing balance-Leahy Scale: Poor ?  ?  ?  ?  ?  ?  ?  ?  ?  ?  ?  ?  ?  ? ?  ?Cognition Arousal/Alertness: Awake/alert ?Behavior During Therapy: [facility] for tasks assessed/performed ?Overall Cognitive Status: Within Functional Limits for tasks assessed ?  ?  ?  ?  ?  ?  ?  ?  ?  ?  ?  ?  ?  ?  ?  ?  ?  ?  ?  ? ?  ?Exercises Other Exercises ?Other Exercises: Instructed patient on HEP handout as patient stated she did not know how to perform the exercises on sheet. ? ?  ?General Comments   ?  ?  ? ?Pertinent Vitals/Pain Pain Assessment ?Pain Assessment: Faces ?Faces Pain Scale: Hurts little more ?Pain Location: R hip ?Pain Descriptors / Indicators: Grimacing, Guarding ?Pain Intervention(s):  Monitored during session  ? ? ?Home Living   ?  ?  ?  ?  ?  ?  ?  ?  ?  ?   ?  ?Prior Function    ?  ?  ?   ? ?PT Goals (current goals can now be found in the care plan section) Acute Rehab PT Goals ?PT Goal Formulation: With patient ?Time For Goal Achievement: 10/05/21 ?Potential to Achieve Goals: Good ?Progress  towards PT goals: Progressing toward goals ? ?  ?Frequency ? ? ? 7X/week ? ? ? ?  ?PT Plan Current plan remains appropriate  ? ? ?Co-evaluation   ?  ?  ?  ?  ? ?  ?AM-PAC PT "6 Clicks" Mobility   ?Outcome Measure ? Help needed turning from your back to your side while in a flat bed without using bedrails?: A Little ?Help needed moving from lying on your back to sitting on the side of a flat bed without using bedrails?: A Little ?Help needed moving to and from a bed to a chair (including a wheelchair)?: A Little ?Help needed standing up from a chair using your arms (e.g., wheelchair or bedside chair)?: A Little ?Help needed to walk in hospital room?: A Little ?Help needed climbing 3-5 steps with a railing? : A Little ?6 Click Score: 18 ? ?  ?End of Session   ?Activity Tolerance: Patient tolerated treatment well ?Patient left: in chair;with call bell/phone within reach ?Nurse Communication: Mobility status ?PT Visit Diagnosis: Other abnormalities of gait and mobility (R26.89);Pain ?Pain - Right/Left: Right ?Pain - part of body: Hip ?  ? ? ?Time: 3748-2707 ?PT Time Calculation (min) (ACUTE ONLY): 16 min ? ?Charges:  $Gait Training: 8-22 mins          ?          ? ?Axavier Pressley A. Dan Humphreys, PT, DPT ?Acute Rehabilitation Services ?Pager [phone] ?Office [phone] ? ? ? ?Shruthi Northrup A Dodi Leu ?09/22/2021, 10:00 AM ? ?

## 2021-09-22 NOTE — Care Plan (Signed)
Spoke with [facility] after arranging services early in the week with them in [place] area. Explained that CM had to wait to send discharge summary after it was written by PA/MD and determined if patient would go home today. Confirmed she will be discharged today and spoke with patient's daughter from [place], who is currently on her way to pick up her mother and take her back to her home for recovery there in [place]. All orders, notes, D/C summary sent to [facility] office care of [facility]. Patient seen in hospital today by [facility] and she states, "I'm ready to go home". Doing very well with therapy. Updated Dr. Roda Shutters and his PA. SOC verbalized by [employer] contact as tomorrow in [place].  ?

## 2021-09-22 NOTE — Progress Notes (Signed)
Pt doing well. Pt given D/C instructions with verbal understanding. Rx's were sent to the pharmacy by MD. Pt's incision is clean and dry with no sign of infection. Pt's IV was removed prior to D/C. Pt received RW and 3-n-1 from [employer] per MD order. Pt is stable @ D/C and has no other needs at this time. Deyvi Bonanno, RN   

## 2021-09-23 ENCOUNTER — Telehealth: Payer: Self-pay

## 2021-09-23 NOTE — Telephone Encounter (Signed)
Patient called and had surgery 09/21/2021-R THA. She states they lost her ice man machine at the hospital . They cannot find it. They told her to contact us to see what she can do . States someone must of moved it and misplaced it. ? ? ? ?CB: [phone] ? ? ?

## 2021-09-25 NOTE — Telephone Encounter (Signed)
Do we have any spare machines at the office?  She should use ice packs as an alternative.

## 2021-09-26 ENCOUNTER — Telehealth: Payer: Self-pay | Admitting: Orthopaedic Surgery

## 2021-09-26 NOTE — Telephone Encounter (Signed)
Pt called asking for a call back from [place]. Pt states she had surgery last week and has a medical question for Marisue Ivan. Please call pt at [phone] ?

## 2021-09-26 NOTE — Telephone Encounter (Signed)
Sent message to Ryan/Donjoy ?He will call patient and help with this issue ?

## 2021-09-27 ENCOUNTER — Telehealth: Payer: Self-pay | Admitting: *Deleted

## 2021-09-27 NOTE — Telephone Encounter (Signed)
Ortho bundle D/C call completed. 

## 2021-09-30 ENCOUNTER — Telehealth: Payer: Self-pay | Admitting: *Deleted

## 2021-09-30 NOTE — Telephone Encounter (Signed)
Attempted 1 week call to patient. No answer and left VM requesting call back. Reminded of appointment next week. ?

## 2021-10-03 ENCOUNTER — Telehealth: Payer: Self-pay | Admitting: Orthopaedic Surgery

## 2021-10-03 NOTE — Telephone Encounter (Signed)
Sheri, do you mind giving her a call and addressing her concerns.  Thank you.

## 2021-10-03 NOTE — Telephone Encounter (Signed)
Patient called advised she may be having a reaction to the medication she is taking. Patient said it's a burning sensation she feels when she void her bladder and it also hurt when she have a bowel movement. Patient said she is taking Ondansetron 4 mg,Oxycodone 5-325 and Methocarbamol 500mg .   The number to contact patient is [phone] ?

## 2021-10-04 ENCOUNTER — Telehealth: Payer: Self-pay | Admitting: *Deleted

## 2021-10-04 NOTE — Telephone Encounter (Signed)
Thanks

## 2021-10-04 NOTE — Telephone Encounter (Signed)
Spoke with patient and answered questions about return to her own home from her daughter's home in [place] and completion of HHPT this week. She has an appointment tomorrow here. Discussed symptoms of burning she is having with urination occasionally. She states she has pain only sometimes and it seems irritated "down there". She describes as burning with urination, but only sometimes and no fever/chills or frequency of urination. Discussed if she is having pain due to previous constipation and if what she is feeling is hemorrhoid related as she was having a hard time describing where her pain was. Told her we could discuss in her appointment tomorrow as well. Encouraged fluids.  ?

## 2021-10-05 ENCOUNTER — Ambulatory Visit (INDEPENDENT_AMBULATORY_CARE_PROVIDER_SITE_OTHER): Payer: Medicare HMO | Admitting: Physician Assistant

## 2021-10-05 ENCOUNTER — Encounter: Payer: Self-pay | Admitting: Orthopaedic Surgery

## 2021-10-05 ENCOUNTER — Other Ambulatory Visit: Payer: Self-pay

## 2021-10-05 DIAGNOSIS — Z96641 Presence of right artificial hip joint: Secondary | ICD-10-CM

## 2021-10-05 MED ORDER — ASPIRIN EC 81 MG PO TBEC: 81.0000 mg | DELAYED_RELEASE_TABLET | Freq: Two times a day (BID) | ORAL | 0 refills | Status: DC

## 2021-10-05 MED ORDER — TRAMADOL HCL 50 MG PO TABS: 50.0000 mg | ORAL_TABLET | Freq: Four times a day (QID) | ORAL | 2 refills | Status: DC | PRN

## 2021-10-05 NOTE — Progress Notes (Signed)
? ?Post-Op Visit Note ?  ?Patient: [name]           ?Date of Birth: 1956-07-10           ?MRN: [id number] ?Visit Date: 10/05/2021 ?PCP: [name], NP ? ? ?Assessment & Plan: ? ?Chief Complaint:  ?Chief Complaint  ?Patient presents with  ? Right Hip - Pain  ? ?Visit Diagnoses:  ?1. Status post total replacement of right hip   ? ? ?Plan: ?Patient is a pleasant 66 year old female who comes in today 2 weeks status post right total hip replacement 08/24/2020.  She has been doing well.  She is living with her daughter right now in [place] where she is getting home health physical therapy.  She is ambulating with a walker.  She has not been taking her baby aspirin.  Examination right hip reveals a fully healed surgical scar with nylon sutures in place.  No evidence of infection or cellulitis.  Calf soft nontender.  She is neurovascular tact distally.  At this point, I recommended that she start taking a baby aspirin twice daily for the next 4 weeks.  Dental prophylaxis reinforced.  Sutures removed today and Steri-Strips applied.  Follow-up with [place] in 4 weeks time.  Evaluation AP pelvis x-rays. ? ?Follow-Up Instructions: Return in about 4 weeks (around 11/02/2021).  ? ?Orders:  ?No orders of the defined types were placed in this encounter. ? ?Meds ordered this encounter  ?Medications  ? traMADol (ULTRAM) 50 MG tablet  ?  Sig: Take 1 tablet (50 mg total) by mouth every 6 (six) hours as needed.  ?  Dispense:  60 tablet  ?  Refill:  2  ? aspirin EC 81 MG tablet  ?  Sig: Take 1 tablet (81 mg total) by mouth 2 (two) times daily.  ?  Dispense:  56 tablet  ?  Refill:  0  ? ? ?Imaging: ?No new imaging ? ?PMFS History: ?Patient Active Problem List  ? Diagnosis Date Noted  ? 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  ? Primary osteoarthritis of right hip 03/24/2021  ? TRIGGER FINGER, LEFT THUMB 08/12/2009  ? ANXIETY 05/20/2009  ? CANDIDIASIS, SKIN 03/12/2009  ? TINGLING 12/24/2008  ? PHARYNGITIS  08/31/2008  ? UPPER RESPIRATORY INFECTION 08/31/2008  ? CHEST PAIN 02/03/2008  ? HYPOKALEMIA 11/07/2007  ? Other specified disorders of bladder 09/26/2007  ? GASTRITIS 08/07/2007  ? HEADACHE 08/07/2007  ? CONSTIPATION 07/23/2007  ? MICROSCOPIC HEMATURIA 07/23/2007  ? DENTAL CARIES 05/27/2007  ? TINEA PEDIS 05/23/2007  ? ALLERGIC RHINITIS 05/23/2007  ? GERD 05/23/2007  ? HIATAL HERNIA 05/23/2007  ? DEGENERATIVE JOINT DISEASE, LEFT KNEE 05/23/2007  ? ESSENTIAL HYPERTENSION, BENIGN 05/20/2007  ? ?Past Medical History:  ?Diagnosis Date  ? Acid reflux   ? Per records from [facility] of the [facility]  ? Alcohol use   ? Per Records received from [facility]- pt states she only has a [place] of wine occasionally  ? Arthritis   ? Knee and hip  ? BMI 36.0-36.9,adult   ? Per Records received from [facility]  ? Chronic knee pain   ? L & R  ? GERD (gastroesophageal reflux disease)   ? Heart murmur, systolic 09/19/2021  ? 1/6 systolic  ? Hyperlipidemia   ? Hypertension   ? Morbid obesity (HCC)   ? Per Records received from [facility]  ? Other osteoarthritis involving multiple joints   ? Per Records received from [facility]  ? Prediabetes   ? Per Records received from [facility]  ? 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(HCC)   ? Per records from [facility] of the [facility]  ? Right hip pain   ? Per records from [facility] of the [facility]  ? Right thigh pain 2022  ? Per records from [facility] of the [facility]  ? Vitamin D deficiency   ? Per Records received from [facility]  ?  ?Family History  ?Problem Relation Age of Onset  ? Hypertension Mother   ?     Per records from [facility] of the [facility]  ? Cancer Mother 68  ?     colon  ? Colon cancer Mother   ?     Per records from [facility] of the [facility]  ? Cancer Father 27  ?     lung  ? Hypertension Father   ?     Per records from [facility] of the [facility]  ? Lung disease Father   ?     Per records from [facility]  of the [facility]  ? Seizures Brother   ? Epilepsy Brother   ? Drug abuse Daughter   ? Other Daughter   ?     Murder in 2014  ?  ?Past Surgical History:  ?Procedure Laterality Date  ? JOINT REPLACEMENT Left 07/11/2018  ? knee replacement  ? TOE SURGERY Right   ? bone spur "shaved" down- many years ago  ? TOTAL HIP ARTHROPLASTY Right 09/21/2021  ? Procedure: RIGHT TOTAL HIP ARTHROPLASTY ANTERIOR APPROACH;  Surgeon: Tarry Kos, MD;  Location: [facility] OR;  Service: Orthopedics;  Laterality: Right;  ? TUBAL LIGATION  1978  ? Per records from [facility] of the [facility]  ? ?Social History  ? ?Occupational History  ? Not on file  ?Tobacco Use  ? Smoking status: Never  ? Smokeless tobacco: Never  ?Vaping Use  ? Vaping Use: Never used  ?Substance and Sexual Activity  ? Alcohol use: Yes  ?  Comment: pt states she may have 1 [place] of wine on special occasions  ? Drug use: No  ? Sexual activity: Not on file  ? ? ? ?

## 2021-10-20 ENCOUNTER — Telehealth: Payer: Self-pay | Admitting: Orthopaedic Surgery

## 2021-10-20 ENCOUNTER — Telehealth: Payer: Self-pay | Admitting: *Deleted

## 2021-10-20 NOTE — Telephone Encounter (Signed)
Add note. Pt is asking for permission to drive so she can go back to work. Please call pt at [phone]. ?

## 2021-10-20 NOTE — Telephone Encounter (Signed)
Called patient no answer. Line kept ringing. Unable to leave VM. ? ?Need to know what kind of work she do? ?See message below. ? ? ?

## 2021-10-20 NOTE — Telephone Encounter (Signed)
What does she do?

## 2021-10-20 NOTE — Telephone Encounter (Signed)
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 She does have to bend, but her short term disability she states is running out. She asked if she could return to work and if someone would write a note for light duty. She also wanted to know about driving. She is 30 days post op. Thanks. ?

## 2021-10-20 NOTE — Telephone Encounter (Signed)
Patient called asked if she can get a note returning to work on light duty. Patient would like to return to work Sunday 10/23/2021. Patient said she will pick up note. The number to contact patient is [phone]  ?

## 2021-10-20 NOTE — Telephone Encounter (Signed)
Light duty for how long and also can she drive? ? ?

## 2021-10-20 NOTE — Telephone Encounter (Signed)
Ok to do.  Marisue Ivan, can you write?

## 2021-10-20 NOTE — Telephone Encounter (Signed)
See message from Penni Homans. ? 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 She does have to bend, but her short term disability she states is running out. She asked if she could return to work and if someone would write a note for light duty. She also wanted to know about driving. She is 30 days post op. Thanks." ?

## 2021-10-24 NOTE — Telephone Encounter (Signed)
Sounds good

## 2021-10-24 NOTE — Telephone Encounter (Signed)
Note done

## 2021-10-24 NOTE — Telephone Encounter (Signed)
Spoke to [name] Friday about this

## 2021-10-25 ENCOUNTER — Telehealth: Payer: Self-pay | Admitting: Physician Assistant

## 2021-10-25 NOTE — Telephone Encounter (Signed)
Patient called advised she took  the note to her work but, her employer will not allow her back this week.  Patient said she will need a note for being out of work this week as well. ? ?Patient said her employer asked if she can return to work next week. Patient said her employer need to know exactly what restrictions patient will have and how long when she return to work. ? Patient said her employer told her to tell Dr. 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 The number to contact patient is [phone] ?

## 2021-10-25 NOTE — Telephone Encounter (Signed)
No bending, stooping, climbing, lifting more than 15 lbs for 6 weeks

## 2021-10-27 NOTE — Telephone Encounter (Signed)
Note made. Sent pt message. ? ?

## 2021-11-02 ENCOUNTER — Ambulatory Visit (INDEPENDENT_AMBULATORY_CARE_PROVIDER_SITE_OTHER): Payer: Medicare HMO

## 2021-11-02 ENCOUNTER — Encounter: Payer: Self-pay | Admitting: Orthopaedic Surgery

## 2021-11-02 ENCOUNTER — Ambulatory Visit (INDEPENDENT_AMBULATORY_CARE_PROVIDER_SITE_OTHER): Payer: Medicare HMO | Admitting: Orthopaedic Surgery

## 2021-11-02 DIAGNOSIS — M25551 Pain in right hip: Secondary | ICD-10-CM | POA: Diagnosis not present

## 2021-11-02 NOTE — Progress Notes (Signed)
? ?Post-Op Visit Note ?  ?Patient: [name]           ?Date of Birth: 12/23/1955           ?MRN: [id number] ?Visit Date: 11/02/2021 ?PCP: Lauree Chandler, NP ? ? ?Assessment & Plan: ? ?Chief Complaint:  ?Chief Complaint  ?Patient presents with  ? Right Hip - Pain  ? ?Visit Diagnoses:  ?1. Pain in right hip   ? ? ?Plan: Elisabella is here for her 6-week postop check from right total hip replacement on 08/24/2021.  She is doing well overall.  She has no real complaints.  She would like to try to go back to work soon. ? ?Examination of right hip shows a fully healed surgical scar.  No evidence of infection.  She is ambulating with a single-point cane.  No pain with gentle hip range of motion and circumduction. ? ?Makhari is recovering well from her surgery and she is pleased with her progress.  DVT prophylaxis can be discontinued at this time.  Dental prophylaxis reinforced.  She may drive at this time.  She will try to go back to work this Sunday but she will be very careful and avoid precarious positions.  Recheck in 6 weeks for 23-month visit. ? ?Follow-Up Instructions: Return in about 6 weeks (around 12/14/2021).  ? ?Orders:  ?Orders Placed This Encounter  ?Procedures  ? XR Pelvis 1-2 Views  ? ?No orders of the defined types were placed in this encounter. ? ? ?Imaging: ?XR Pelvis 1-2 Views ? ?Result Date: 11/02/2021 ?Stable total hip replacement without complications  ? ?PMFS History: ?Patient Active Problem List  ? Diagnosis Date Noted  ? 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  ? Primary osteoarthritis of right hip 03/24/2021  ? TRIGGER FINGER, LEFT THUMB 08/12/2009  ? ANXIETY 05/20/2009  ? CANDIDIASIS, SKIN 03/12/2009  ? TINGLING 12/24/2008  ? PHARYNGITIS 08/31/2008  ? UPPER RESPIRATORY INFECTION 08/31/2008  ? CHEST PAIN 02/03/2008  ? HYPOKALEMIA 11/07/2007  ? Other specified disorders of bladder 09/26/2007  ? GASTRITIS 08/07/2007  ? HEADACHE 08/07/2007  ? CONSTIPATION 07/23/2007  ? MICROSCOPIC HEMATURIA  07/23/2007  ? DENTAL CARIES 05/27/2007  ? TINEA PEDIS 05/23/2007  ? ALLERGIC RHINITIS 05/23/2007  ? GERD 05/23/2007  ? HIATAL HERNIA 05/23/2007  ? DEGENERATIVE JOINT DISEASE, LEFT KNEE 05/23/2007  ? ESSENTIAL HYPERTENSION, BENIGN 05/20/2007  ? ?Past Medical History:  ?Diagnosis Date  ? Acid reflux   ? Per records from [place]  ? Alcohol use   ? Per Records received from [facility]- pt states she only has a [place] of wine occasionally  ? Arthritis   ? Knee and hip  ? BMI 36.0-36.9,adult   ? Per Records received from [facility]  ? Chronic knee pain   ? L & R  ? GERD (gastroesophageal reflux disease)   ? Heart murmur, systolic [id number]  ? 1/6 systolic  ? Hyperlipidemia   ? Hypertension   ? Morbid obesity ([place])   ? Per Records received from [facility]  ? Other osteoarthritis involving multiple joints   ? Per Records received from [facility]  ? Prediabetes   ? Per Records received from [facility]  ? 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([place])   ? Per records from [place]  ? Right hip pain   ? Per records from [place]  ? Right thigh pain 2022  ? Per records [facility]  ? Vitamin D deficiency   ? Per Records received from [facility]  ?  ?Family History  ?Problem Relation Age of Onset  ? Hypertension Mother   ?     Per records from [place]  ? Cancer Mother 23  ?     colon  ? Colon cancer Mother   ?     Per records from [place]  ? Cancer Father 90  ?     lung  ? Hypertension Father   ?     Per records from [place]  ? Lung disease Father   ?     Per records from [place]  ? Seizures Brother   ? Epilepsy Brother   ? Drug abuse Daughter   ? Other Daughter   ?     Murder in 2014  ?  ?Past Surgical History:  ?Procedure Laterality Date  ? JOINT REPLACEMENT Left 07/11/2018  ? knee replacement  ? TOE  SURGERY Right   ? bone spur "shaved" down- many years ago  ? TOTAL HIP ARTHROPLASTY Right 09/21/2021  ? Procedure: RIGHT TOTAL HIP ARTHROPLASTY ANTERIOR APPROACH;  Surgeon: Leandrew Koyanagi, MD;  Location: [place];  Service: Orthopedics;  Laterality: Right;  ? TUBAL LIGATION  1978  ? Per records from [place]  ? ?Social History  ? ?Occupational History  ? Not on file  ?Tobacco Use  ? Smoking status: Never  ? Smokeless tobacco: Never  ?Vaping Use  ? Vaping Use: Never used  ?Substance and Sexual Activity  ? Alcohol use: Yes  ?  Comment: pt states she may have 1 [place] of wine on special occasions  ? Drug use: No  ? Sexual activity: Not on file  ? ? ? ?

## 2021-11-03 ENCOUNTER — Telehealth: Payer: Self-pay | Admitting: Orthopaedic Surgery

## 2021-11-03 NOTE — Telephone Encounter (Signed)
We will which she would like to do?  I can release her without restrictions if she feels ready but I am also happy to keep her out of work until her follow-up appointment.

## 2021-11-03 NOTE — Telephone Encounter (Signed)
Patient called advised her employer did not except the second letter. Patient said her employer want her to come back to work without any restrictions. Patient said she don't know what to do and it's very frustration for her. Patient asked for a call back so that she will know what to do. The number to contact patient is [phone] ?

## 2021-11-04 ENCOUNTER — Telehealth: Payer: Self-pay | Admitting: Orthopaedic Surgery

## 2021-11-04 ENCOUNTER — Other Ambulatory Visit: Payer: Self-pay

## 2021-11-04 NOTE — Telephone Encounter (Signed)
Called patient she would like to be OOW until next appt. Note made. ? ?

## 2021-11-04 NOTE — Telephone Encounter (Signed)
Returned call to patient asked for a call back. ?

## 2021-11-04 NOTE — Telephone Encounter (Signed)
Received $50.00 cash,2 medical records release forms and FMLS/Disability forms from patient/Forwarding to [facility] today ?

## 2021-11-17 NOTE — Progress Notes (Signed)
? ? ?Careteam: ?Patient Care Team: ?Stephanie Chandler, NP as PCP - General (Geriatric Medicine) ? ?PLACE OF SERVICE:  ?[facility]  ?Advanced Directive information ?Does Patient Have a Medical Advance Directive?: No, Would patient like information on creating a medical advance directive?: No - Patient declined ? ?Allergies  ?Allergen Reactions  ? Aspirin Other (See Comments)  ?  Acid reflux  ? Ibuprofen Other (See Comments)  ?  Acid reflux ?  ? ? ?Chief Complaint  ?Patient presents with  ? Medical Management of Chronic Issues  ?  PT is here for a 66M follow up, discuss TDAP, and Shingrix  ? ? ? ?HPI: Patient is a 66 y.o. female for routine follow up.  ?She had a hip replacement march 15th- was able to go back to work with restrictions but work would like honor it. So she had to continue out of work. She does not have medical leave and bills are coming.  ?Reports pain controlled- does not require medication most of the time. Trying to wean off her cane.  ? ?Hyperlipidemia- on crestor ? ?Htn- controlled on lisinopril-hctz but did not take medication today- she ran out but plans to get it today.  ? ? ?Review of Systems:  ?Review of Systems  ?Constitutional:  Negative for chills, fever and weight loss.  ?HENT:  Negative for tinnitus.   ?Respiratory:  Negative for cough, sputum production and shortness of breath.   ?Cardiovascular:  Negative for chest pain, palpitations and leg swelling.  ?Gastrointestinal:  Negative for abdominal pain, constipation, diarrhea and heartburn.  ?Genitourinary:  Negative for dysuria, frequency and urgency.  ?Musculoskeletal:  Positive for myalgias. Negative for back pain, falls and joint pain.  ?Skin: Negative.   ?Neurological:  Negative for dizziness and headaches.  ?Psychiatric/Behavioral:  Negative for depression and memory loss. The patient does not have insomnia.   ?Past Medical History:  ?Diagnosis Date  ? Acid reflux   ? Per records from [place]  ?  Alcohol use   ? Per [facility] received from [facility]- pt states she only has a Markes of wine occasionally  ? Arthritis   ? Knee and hip  ? BMI 36.0-36.9,adult   ? Per Records received from [facility]  ? Chronic knee pain   ? L & R  ? GERD (gastroesophageal reflux disease)   ? Heart murmur, systolic 90/30/0923  ? 1/6 systolic  ? Hyperlipidemia   ? Hypertension   ? Morbid obesity ([place])   ? Per Records received from [facility]  ? Other osteoarthritis involving multiple joints   ? Per Records received from [facility]  ? Prediabetes   ? Per Records received from [facility]  ? 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([place])   ? Per records from [place]  ? Right hip pain   ? Per records from [place]  ? Right thigh pain 2022  ? Per records from [place]  ? Vitamin D deficiency   ? Per Records received from [facility]  ? ?Past Surgical History:  ?Procedure Laterality Date  ? JOINT REPLACEMENT Left 07/11/2018  ? knee replacement  ? TOE SURGERY Right   ? bone spur "shaved" down- many years ago  ? TOTAL HIP ARTHROPLASTY Right 09/21/2021  ? Procedure: RIGHT TOTAL HIP ARTHROPLASTY ANTERIOR APPROACH;  Surgeon: Leandrew Koyanagi, MD;  Location: [place];  Service: Orthopedics;  Laterality: Right;  ?  TUBAL LIGATION  1978  ? Per records from [place]  ? ?Social History: ?  reports that she has never smoked. She has never used smokeless tobacco. She reports current alcohol use. She reports that she does not use drugs. ? ?Family History  ?Problem Relation Age of Onset  ? Hypertension Mother   ?     Per records from [place]  ? Cancer Mother 15  ?     colon  ? Colon cancer Mother   ?     Per records from [place]  ? Cancer Father 20  ?     lung  ? Hypertension Father   ?     Per records from [place]  ? Lung disease Father   ?     Per records from [place]  ? Seizures Brother   ? Epilepsy Brother   ? Drug abuse Daughter   ? Other Daughter   ?     Murder in 2014  ? ? ?Medications: ?Patient's Medications  ?New Prescriptions  ? No medications on file  ?Previous Medications  ? CALCIUM-VITAMIN D PO    Take 1 tablet by mouth daily.  ? LISINOPRIL-HYDROCHLOROTHIAZIDE (ZESTORETIC) 20-12.5 MG TABLET    Take 1 tablet by mouth daily.  ? METHOCARBAMOL (ROBAXIN) 500 MG TABLET    Take 1 tablet (500 mg total) by mouth 2 (two) times daily as needed.  ? MULTIPLE VITAMIN (MULTIVITAMIN WITH MINERALS) TABS TABLET    Take 1 tablet by mouth daily.  ? OXYCODONE-ACETAMINOPHEN (PERCOCET) 5-325 MG TABLET    Take 1-2 tablets by mouth every 6 (six) hours as needed. To be taken after surgery  ? ROSUVASTATIN (CRESTOR) 5 MG TABLET    Take 5 mg by mouth at bedtime.  ? TRAMADOL (ULTRAM) 50 MG TABLET    Take 1 tablet (50 mg total) by mouth every 6 (six) hours as needed.  ?Modified Medications  ? No medications on file  ?Discontinued Medications  ? ASPIRIN EC 81 MG TABLET    Take 1 tablet (81 mg total) by mouth 2 (two) times daily.  ? DOCUSATE SODIUM (COLACE) 100 MG CAPSULE    Take 1 capsule (100 mg total) by mouth daily as needed.  ? ONDANSETRON (ZOFRAN) 4 MG TABLET    Take 1 tablet (4 mg total) by mouth every 8 (eight) hours as needed for nausea or vomiting.  ? ? ?Physical Exam: ? ?Vitals:  ? 11/18/21 0936  ?BP: 140/86  ?Pulse: 80  ?Resp: 20  ?Temp: 98 ?F (36.7 ?C)  ?TempSrc: Temporal  ?SpO2: 98%  ?Weight: 162 lb 11.2 oz (73.8 kg)  ?Height: '4\' 11"'  (1.499 m)  ? ?Body mass index is 32.86 kg/m?. ?Wt Readings from Last 3 Encounters:  ?11/18/21 162 lb 11.2 oz (73.8 kg)  ?09/21/21 159 lb (72.1 kg)  ?09/19/21 159 lb (72.1 kg)  ? ? ?Physical Exam ?Constitutional:   ?   General: She is not in acute distress. ?   Appearance: She is well-developed. She is not diaphoretic.  ?HENT:  ?   Head: Normocephalic and atraumatic.  ?   Right Ear: There is impacted cerumen.  ?   Left Ear:  There is impacted cerumen.  ?   Mouth/Throat:  ?   Pharynx: No oropharyngeal exudate.  ?Eyes:  ?   Conjunctiva/sclera: Conjunctivae normal.  ?   Pupils:  Pupils are equal, round, and reactive to light.  ?Cardiovascular:  ?   Rate and Rhythm: Normal rate and regular rhythm.  ?   Heart sounds: Normal heart sounds.  ?Pulmonary:  ?   Effort: Pulmonary effort is normal.  ?   Breath sounds: Normal breath sounds.  ?Abdominal:  ?   General: Bowel sounds are normal.  ?   Palpations: Abdomen is soft.  ?Musculoskeletal:  ?   Cervical back: Normal range of motion and neck supple.  ?   Right lower leg: No edema.  ?   Left lower leg: No edema.  ?Skin: ?   General: Skin is warm and dry.  ?Neurological:  ?   Mental Status: She is alert.  ?Psychiatric:     ?   Mood and Affect: Mood normal.  ? ? ?Labs reviewed: ?Basic Metabolic Panel: ?Recent Labs  ?  01/14/21 ?0000 05/27/21 ?1027 09/19/21 ?1003 09/22/21 ?6222  ?NA 140 144 138 138  ?K 4.1 4.2 3.3* 3.3*  ?CL 100 108 101 103  ?CO2 29* '26 29 27  ' ?GLUCOSE  --  80 89 120*  ?BUN '19 15 18 10  ' ?CREATININE 0.8 0.88 0.86 0.68  ?CALCIUM 10.0 9.5 9.3 8.5*  ?TSH 0.65  --   --   --   ? ?Liver Function Tests: ?Recent Labs  ?  01/14/21 ?0000 05/27/21 ?1027 09/19/21 ?1003  ?AST '19 19 22  ' ?ALT '21 14 16  ' ?ALKPHOS 60  --  56  ?BILITOT  --  0.3 0.7  ?PROT  --  6.9 7.3  ?ALBUMIN 4.2  --  3.8  ? ?No results for input(s): LIPASE, AMYLASE in the last 8760 hours. ?No results for input(s): AMMONIA in the last 8760 hours. ?CBC: ?Recent Labs  ?  01/14/21 ?0000 09/19/21 ?1003 09/22/21 ?9798  ?WBC 7.6 8.1 10.0  ?NEUTROABS 3,929.00 4.0  --   ?HGB 13.4 12.3 10.1*  ?HCT 40 38.1 31.4*  ?MCV  --  94.1 93.5  ?PLT 304 274 177  ? ?Lipid Panel: ?Recent Labs  ?  01/14/21 ?0000  ?CHOL 167  ?HDL 52  ?[place] 88  ?TRIG 173*  ? ?TSH: ?Recent Labs  ?  01/14/21 ?0000  ?TSH 0.65  ? ?A1C: ?Lab Results  ?Component Value Date  ? HGBA1C 6.3 (H) 05/27/2021  ? ? ? ?Assessment/Plan ?1. Essential hypertension, benign ?-recommend take blood  pressure medication prior to visits  ?-she does not check blood pressure at home, recommended to start taking, goal <140/90 ?- CBC with Differential/Platelet ?- CMP with eGFR([facility]) ? ?2. Allergic rhinitis, un

## 2021-11-18 ENCOUNTER — Encounter: Payer: Self-pay | Admitting: Nurse Practitioner

## 2021-11-18 ENCOUNTER — Ambulatory Visit (INDEPENDENT_AMBULATORY_CARE_PROVIDER_SITE_OTHER): Payer: Medicare HMO | Admitting: Nurse Practitioner

## 2021-11-18 ENCOUNTER — Telehealth: Payer: Self-pay | Admitting: Orthopaedic Surgery

## 2021-11-18 VITALS — BP 140/86 | HR 80 | Temp 98.0°F | Resp 20 | Ht 59.0 in | Wt 162.7 lb

## 2021-11-18 DIAGNOSIS — M1611 Unilateral primary osteoarthritis, right hip: Secondary | ICD-10-CM | POA: Diagnosis not present

## 2021-11-18 DIAGNOSIS — E782 Mixed hyperlipidemia: Secondary | ICD-10-CM

## 2021-11-18 DIAGNOSIS — H6123 Impacted cerumen, bilateral: Secondary | ICD-10-CM | POA: Diagnosis not present

## 2021-11-18 DIAGNOSIS — Z1211 Encounter for screening for malignant neoplasm of colon: Secondary | ICD-10-CM

## 2021-11-18 DIAGNOSIS — K219 Gastro-esophageal reflux disease without esophagitis: Secondary | ICD-10-CM

## 2021-11-18 DIAGNOSIS — I10 Essential (primary) hypertension: Secondary | ICD-10-CM | POA: Diagnosis not present

## 2021-11-18 DIAGNOSIS — J309 Allergic rhinitis, unspecified: Secondary | ICD-10-CM | POA: Diagnosis not present

## 2021-11-18 DIAGNOSIS — Z1212 Encounter for screening for malignant neoplasm of rectum: Secondary | ICD-10-CM

## 2021-11-18 DIAGNOSIS — R739 Hyperglycemia, unspecified: Secondary | ICD-10-CM

## 2021-11-18 DIAGNOSIS — F411 Generalized anxiety disorder: Secondary | ICD-10-CM

## 2021-11-18 NOTE — Telephone Encounter (Signed)
Patient requested that note be faxed to her employer @ [phone]. ? ?Patient's correct call back number is [phone]. ?

## 2021-11-18 NOTE — Telephone Encounter (Signed)
yes

## 2021-11-18 NOTE — Telephone Encounter (Signed)
Faxed to provided number  

## 2021-11-18 NOTE — Telephone Encounter (Signed)
Pt called requesting a letter for release back to work with no restrictions. Please call pt at [phone].  ?

## 2021-11-19 LAB — LIPID PANEL
Cholesterol: 176 mg/dL (ref <200)
HDL: 57 mg/dL (ref >=50)
LDL Cholesterol (Calc): 96 mg/dL (calc)
Non-HDL Cholesterol (Calc): 119 mg/dL (calc) (ref <130)
Total CHOL/HDL Ratio: 3.1 (calc) (ref <5.0)
Triglycerides: 131 mg/dL (ref <150)

## 2021-11-19 LAB — CBC WITH DIFFERENTIAL/PLATELET
Absolute Monocytes: 311 cells/uL (ref 200–950)
Basophils Absolute: 28 cells/uL (ref 0–200)
Basophils Relative: 0.4 %
Eosinophils Absolute: 283 cells/uL (ref 15–500)
Eosinophils Relative: 4.1 %
HCT: 36.6 % (ref 35.0–45.0)
Hemoglobin: 11.7 g/dL (ref 11.7–15.5)
Lymphs Abs: 2829 cells/uL (ref 850–3900)
MCH: 29 pg (ref 27.0–33.0)
MCHC: 32 g/dL (ref 32.0–36.0)
MCV: 90.8 fL (ref 80.0–100.0)
MPV: 12.2 fL (ref 7.5–12.5)
Monocytes Relative: 4.5 %
Neutro Abs: 3450 cells/uL (ref 1500–7800)
Neutrophils Relative %: 50 %
Platelets: 220 10*3/uL (ref 140–400)
RBC: 4.03 10*6/uL (ref 3.80–5.10)
RDW: 12 % (ref 11.0–15.0)
Total Lymphocyte: 41 %
WBC: 6.9 10*3/uL (ref 3.8–10.8)

## 2021-11-19 LAB — COMPLETE METABOLIC PANEL WITH GFR
AG Ratio: 1.4 (calc) (ref 1.0–2.5)
ALT: 9 U/L (ref 6–29)
AST: 13 U/L (ref 10–35)
Albumin: 4.1 g/dL (ref 3.6–5.1)
Alkaline phosphatase (APISO): 66 U/L (ref 37–153)
BUN: 10 mg/dL (ref 7–25)
CO2: 27 mmol/L (ref 20–32)
Calcium: 9.3 mg/dL (ref 8.6–10.4)
Chloride: 104 mmol/L (ref 98–110)
Creat: 0.61 mg/dL (ref 0.50–1.05)
Globulin: 2.9 g/dL (calc) (ref 1.9–3.7)
Glucose, Bld: 118 mg/dL (ref 65–139)
Potassium: 4 mmol/L (ref 3.5–5.3)
Sodium: 139 mmol/L (ref 135–146)
Total Bilirubin: 0.5 mg/dL (ref 0.2–1.2)
Total Protein: 7 g/dL (ref 6.1–8.1)
eGFR: 99 mL/min/{1.73_m2} (ref >=60)

## 2021-11-19 LAB — HEMOGLOBIN A1C
Hgb A1c MFr Bld: 5.9 % of total Hgb — ABNORMAL HIGH (ref <5.7)
Mean Plasma Glucose: 123 mg/dL
eAG (mmol/L): 6.8 mmol/L

## 2021-11-25 ENCOUNTER — Telehealth: Payer: Self-pay | Admitting: *Deleted

## 2021-11-25 NOTE — Telephone Encounter (Signed)
[name], Receptionist called patient to confirm patient's lab appointment for 5/23. Patient stated that she didn't think she needed the appointment because she just had labs drawn in the office.   I spoke with patient and canceled labs due to just having labwork done on 11/18/2021. Patient agreed.      [name], NP  11/21/2021 11:29 AM EDT     A1c at goal, electrolytes, glucose, liver and kidney function are normal Blood counts are normal  Cholesterol should be <70 at 96 now

## 2021-11-26 DIAGNOSIS — H2513 Age-related nuclear cataract, bilateral: Secondary | ICD-10-CM | POA: Diagnosis not present

## 2021-11-29 ENCOUNTER — Other Ambulatory Visit: Payer: Medicare HMO

## 2021-11-29 DIAGNOSIS — H524 Presbyopia: Secondary | ICD-10-CM | POA: Diagnosis not present

## 2021-11-29 DIAGNOSIS — Z961 Presence of intraocular lens: Secondary | ICD-10-CM | POA: Diagnosis not present

## 2021-11-29 DIAGNOSIS — H52223 Regular astigmatism, bilateral: Secondary | ICD-10-CM | POA: Diagnosis not present

## 2021-12-02 ENCOUNTER — Ambulatory Visit: Payer: Medicare HMO | Admitting: Nurse Practitioner

## 2021-12-14 ENCOUNTER — Ambulatory Visit (INDEPENDENT_AMBULATORY_CARE_PROVIDER_SITE_OTHER): Payer: Medicare HMO | Admitting: Physician Assistant

## 2021-12-14 DIAGNOSIS — Z96641 Presence of right artificial hip joint: Secondary | ICD-10-CM

## 2021-12-14 NOTE — Progress Notes (Signed)
Post-Op Visit Note   Patient: [name]           Date of Birth: 09-Jun-1956           MRN: [id number] Visit Date: 12/14/2021 PCP: [name], NP   Assessment & Plan:  Chief Complaint: No chief complaint on file.  Visit Diagnoses:  1. History of total hip replacement, right     Plan: Patient is a pleasant 66 year old female who comes in today 3 months status post right total hip replacement 08/24/2021.  She has been doing great.  She works at [employer] where she does quite a bit of walking and notes that she occasionally has to use a cane at the end of the day.  She also notes that a few of her coworkers have noticed her limping.  Overall, very pleased with her outcome.  Examination of the right hip reveals a painless logroll.  Painless hip flexion.  She is neurovascular intact distally.  At this point, we have discussed that she may advance with activity as tolerated.  She will try to be more conscious of her walking and if she feels she is still limping we could send her to outpatient physical therapy to work on gait training.  Dental prophylaxis reinforced.  Follow-up with [place] in 3 months time for repeat evaluation and AP pelvis x-rays.  Call with concerns or questions.  Follow-Up Instructions: Return in about 3 months (around 03/16/2022).   Orders:  No orders of the defined types were placed in this encounter.  No orders of the defined types were placed in this encounter.   Imaging: No new imaging  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   Primary osteoarthritis of right hip 03/24/2021   TRIGGER FINGER, LEFT THUMB 08/12/2009   Anxiety state 05/20/2009   CANDIDIASIS, SKIN 03/12/2009   TINGLING 12/24/2008   CHEST PAIN 02/03/2008   HYPOKALEMIA 11/07/2007   Other specified disorders of bladder 09/26/2007   HEADACHE 08/07/2007   CONSTIPATION 07/23/2007   MICROSCOPIC HEMATURIA 07/23/2007    DENTAL CARIES 05/27/2007   TINEA PEDIS 05/23/2007   Allergic rhinitis 05/23/2007   GERD 05/23/2007   HIATAL HERNIA 05/23/2007   DEGENERATIVE JOINT DISEASE, LEFT KNEE 05/23/2007   ESSENTIAL HYPERTENSION, BENIGN 05/20/2007   Past Medical History:  Diagnosis Date   Acid reflux    [facility]   Alcohol use    Per [facility] received from [facility]- pt states she only has a D[place] of wine occasionally   Arthritis    Knee and hip   BMI 36.0-36.9,adult    Per Records received from [facility]   Chronic knee pain    L & R   GERD (gastroesophageal reflux disease)    Heart murmur, systolic 09/19/2021   1/6 systolic   Hyperlipidemia    Hypertension    Morbid obesity (H[facility])    Per Records received from [facility]   Other osteoarthritis involving multiple joints    Per Records received from [facility]   Prediabetes    Per Records received from [facility]   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[facility]    Per records from [facility]   Right hip pain    Per records [facility]   Right thigh pain 2022   Per records from [facility]  [place]   Vitamin D deficiency    Per Records received from [facility]    Family History  Problem Relation Age of Onset   Hypertension Mother        [facility] [place]   Cancer Mother 55       colon   Colon cancer Mother        [facility] [place]   Cancer Father 72       lung   Hypertension Father        [facility]   Lung disease Father        Per records from [facility]   Seizures Brother    Epilepsy Brother    Drug abuse Daughter    Other Daughter        Murder in 2014    Past Surgical History:  Procedure Laterality Date   JOINT REPLACEMENT Left 07/11/2018   knee replacement   TOE SURGERY Right     bone spur "shaved" down- many years ago   TOTAL HIP ARTHROPLASTY Right 09/21/2021   Procedure: RIGHT TOTAL HIP ARTHROPLASTY ANTERIOR APPROACH;  Surgeon: Tarry Kos, MD;  Location: [facility];  Service: Orthopedics;  Laterality: Right;   TUBAL LIGATION  1978   [facility]   Social History   Occupational History   Not on file  Tobacco Use   Smoking status: Never   Smokeless tobacco: Never  Vaping Use   Vaping Use: Never used  Substance and Sexual Activity   Alcohol use: Yes    Comment: pt states she may have 1 Asmus of wine on special occasions   Drug use: No   Sexual activity: Not on file

## 2021-12-21 ENCOUNTER — Telehealth: Payer: Self-pay | Admitting: *Deleted

## 2021-12-21 NOTE — Telephone Encounter (Signed)
(  Late entry for 12/14/21)- Seen in office and reviewed [place]os survey with patient. 90 day Ortho bundle call completed.

## 2022-02-10 ENCOUNTER — Other Ambulatory Visit: Payer: Self-pay

## 2022-02-10 ENCOUNTER — Emergency Department (HOSPITAL_COMMUNITY)
Admission: EM | Admit: 2022-02-10 | Discharge: 2022-02-10 | Disposition: A | Discharge status: Home / Self Care | Payer: Medicare HMO | Attending: Emergency Medicine | Admitting: Emergency Medicine

## 2022-02-10 ENCOUNTER — Emergency Department (HOSPITAL_COMMUNITY): Payer: Medicare HMO

## 2022-02-10 DIAGNOSIS — R0789 Other chest pain: Secondary | ICD-10-CM | POA: Insufficient documentation

## 2022-02-10 DIAGNOSIS — R0602 Shortness of breath: Secondary | ICD-10-CM | POA: Insufficient documentation

## 2022-02-10 DIAGNOSIS — Z20822 Contact with and (suspected) exposure to covid-19: Secondary | ICD-10-CM | POA: Diagnosis not present

## 2022-02-10 DIAGNOSIS — R079 Chest pain, unspecified: Secondary | ICD-10-CM | POA: Diagnosis not present

## 2022-02-10 DIAGNOSIS — Z79899 Other long term (current) drug therapy: Secondary | ICD-10-CM | POA: Diagnosis not present

## 2022-02-10 DIAGNOSIS — I10 Essential (primary) hypertension: Secondary | ICD-10-CM | POA: Insufficient documentation

## 2022-02-10 LAB — CBC
HCT: 38.6 % (ref 36.0–46.0)
Hemoglobin: 12.6 g/dL (ref 12.0–15.0)
MCH: 29.6 pg (ref 26.0–34.0)
MCHC: 32.6 g/dL (ref 30.0–36.0)
MCV: 90.8 fL (ref 80.0–100.0)
Platelets: 254 10*3/uL (ref 150–400)
RBC: 4.25 MIL/uL (ref 3.87–5.11)
RDW: 12.7 % (ref 11.5–15.5)
WBC: 6.7 10*3/uL (ref 4.0–10.5)
nRBC: 0 % (ref 0.0–0.2)

## 2022-02-10 LAB — BASIC METABOLIC PANEL
Anion gap: 8 (ref 5–15)
BUN: 13 mg/dL (ref 8–23)
CO2: 26 mmol/L (ref 22–32)
Calcium: 9.7 mg/dL (ref 8.9–10.3)
Chloride: 105 mmol/L (ref 98–111)
Creatinine, Ser: 0.73 mg/dL (ref 0.44–1.00)
GFR, Estimated: >60 mL/min (ref >60)
Glucose, Bld: 95 mg/dL (ref 70–99)
Potassium: 3.8 mmol/L (ref 3.5–5.1)
Sodium: 139 mmol/L (ref 135–145)

## 2022-02-10 LAB — SARS CORONAVIRUS 2 BY RT PCR: SARS Coronavirus 2 by RT PCR: NEGATIVE

## 2022-02-10 LAB — TROPONIN I (HIGH SENSITIVITY)
Troponin I (High Sensitivity): 3 ng/L (ref <18)
Troponin I (High Sensitivity): 4 ng/L (ref <18)

## 2022-02-10 MED ORDER — PANTOPRAZOLE SODIUM 20 MG PO TBEC: 20.0000 mg | DELAYED_RELEASE_TABLET | Freq: Every day | ORAL | 1 refills | Status: DC

## 2022-02-10 MED ORDER — LIDOCAINE VISCOUS HCL 2 % MT SOLN
15.0000 mL | Freq: Once | OROMUCOSAL | Status: AC
  Administered 2022-02-10: 15 mL via OROMUCOSAL
  Filled 2022-02-10: qty 15

## 2022-02-10 MED ORDER — ALUM & MAG HYDROXIDE-SIMETH 200-200-20 MG/5ML PO SUSP
30.0000 mL | Freq: Once | ORAL | Status: AC
  Administered 2022-02-10: 30 mL via ORAL
  Filled 2022-02-10: qty 30

## 2022-02-10 NOTE — ED Triage Notes (Signed)
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  States "I just don't feel like myself" and "whatever it is is in my chest"  endorses feeling "sweaty"  but not dizzy.  Also "breathing hard"

## 2022-02-10 NOTE — ED Provider Triage Note (Signed)
  Emergency Medicine Provider Triage Evaluation Note  MRN:  [id number]  Arrival date & time: 02/10/22    Medically screening exam initiated at 2:57 AM.   CC:   Chest Pain   HPI:  [name] is a 66 y.o. year-old female presents to the ED with chief complaint of tightness and some shortness of breath over the past day or so.  She states that she feels fatigued.  She reports having had a cough a few days ago.  She denies fevers, but does report chills.  History provided by patient. ROS:  -As included in HPI PE:   Vitals:   02/10/22 0152  BP: (!) 174/82  Pulse: 70  Resp: 18  Temp: 97.9 F (36.6 C)  SpO2: 100%    Non-toxic appearing No respiratory distress  MDM:  Based on signs and symptoms, COVID is highest on my differential, followed by ACS. I've ordered labs in triage to expedite lab/diagnostic workup.  Patient was informed that the remainder of the evaluation will be completed by another provider, this initial triage assessment does not replace that evaluation, and the importance of remaining in the ED until their evaluation is complete.    Roxy Horseman, PA-C 02/10/22 [phone]

## 2022-02-10 NOTE — Discharge Instructions (Signed)

## 2022-02-10 NOTE — ED Provider Notes (Signed)
[facility] EMERGENCY DEPARTMENT Provider Note   CSN: [id number] Arrival date & time: 02/10/22  0145     History  Chief Complaint  Patient presents with   Chest Pain    [name] is a 66 y.o. female  66 year old female with past medical history of hypertension and hyperlipidemia who presents emergency department with chest tightness.  Patient states that this evening she was at work and she had onset of chest tightness which seem to get worse over about an hour.  She states began feeling a little bit short of breath and decided to come in for evaluation.  She denies nausea, diaphoresis, chest pain.  She states that she has a previous history of similar episode when she was having bad GERD episodes.  She states that when she was living in [place] her doctor took her off of her medications and she thinks that she probably needs to go back on.  She still feels some tightness but it is improved.  No medications prior to my evaluation.   Chest Pain Chest pain location: central chest tightness. Pain quality: tightness   Pain radiates to:  Does not radiate Pain severity:  Moderate Onset quality:  Sudden Duration:  5 hours Timing:  Constant Progression:  Improving Chronicity:  Recurrent Context comment:  At work Relieved by:  Nothing Ineffective treatments:  None tried Associated symptoms: no abdominal pain and no nausea        Home Medications Prior to Admission medications   Medication Sig Start Date End Date Taking? Authorizing Provider  pantoprazole (PROTONIX) 20 MG tablet Take 1 tablet (20 mg total) by mouth daily. 02/10/22  Yes Jenin Birdsall, PA-C  CALCIUM-VITAMIN D PO Take 1 tablet by mouth daily.    [provider]  lisinopril-hydrochlorothiazide (ZESTORETIC) 20-12.5 MG tablet Take 1 tablet by mouth daily.    [provider]  methocarbamol (ROBAXIN) 500 MG tablet Take 250 mg by mouth as needed for muscle spasms.    [provider]  Multiple Vitamin (MULTIVITAMIN WITH MINERALS) TABS tablet Take 1 tablet by mouth daily.    [provider]  oxyCODONE-acetaminophen (PERCOCET/ROXICET) 5-325 MG tablet Take 0.5 tablets by mouth as needed for severe pain.    [provider]  rosuvastatin (CRESTOR) 5 MG tablet Take 5 mg by mouth at bedtime. 04/17/19   [provider]  traMADol (ULTRAM) 50 MG tablet Take 25 mg by mouth as needed.    [provider]      Allergies    Aspirin and Ibuprofen    Review of Systems   Review of Systems  Cardiovascular:  Positive for chest pain.  Gastrointestinal:  Negative for abdominal pain and nausea.    Physical Exam Updated Vital Signs BP (!) 165/76   Pulse (!) 52   Temp 97.6 F (36.4 C) (Oral)   Resp 17   SpO2 100%  Physical Exam Vitals and nursing note reviewed.  Constitutional:      General: She is not in acute distress.    Appearance: She is well-developed. She is not diaphoretic.  HENT:     Head: Normocephalic and atraumatic.     Right Ear: External ear normal.     Left Ear: External ear normal.     Nose: Nose normal.     Mouth/Throat:     Mouth: Mucous membranes are moist.  Eyes:     General: No scleral icterus.    Conjunctiva/sclera: Conjunctivae normal.  Cardiovascular:  Rate and Rhythm: Normal rate and regular rhythm.     Heart sounds: Normal heart sounds. No murmur heard.    No friction rub. No gallop.  Pulmonary:     Effort: Pulmonary effort is normal. No respiratory distress.     Breath sounds: Normal breath sounds.  Abdominal:     General: Bowel sounds are normal. There is no distension.     Palpations: Abdomen is soft. There is no mass.     Tenderness: There is no abdominal tenderness. There is no guarding.  Musculoskeletal:     Cervical back: Normal range of motion.  Skin:    General: Skin is warm and dry.  Neurological:     Mental Status: She is alert and oriented to person, place, and time.   Psychiatric:        Behavior: Behavior normal.     ED Results / Procedures / Treatments   Labs (all labs ordered are listed, but only abnormal results are displayed) Labs Reviewed  SARS CORONAVIRUS 2 BY RT PCR  BASIC METABOLIC PANEL  CBC  TROPONIN I (HIGH SENSITIVITY)  TROPONIN I (HIGH SENSITIVITY)    EKG EKG Interpretation  Date/Time:  Friday February 10 2022 01:48:39 EDT Ventricular Rate:  64 PR Interval:  152 QRS Duration: 82 QT Interval:  406 QTC Calculation: 418 R Axis:   45 Text Interpretation: Normal sinus rhythm Normal ECG When compared with ECG of 19-Sep-2021 10:15, PREVIOUS ECG IS PRESENT No significant change since last tracing Confirmed by Jacalyn Lefevre [phone]) on 02/10/2022 7:22:31 AM  Radiology DG Chest 2 View  Result Date: 02/10/2022 CLINICAL DATA:  Chest pain EXAM: CHEST - 2 VIEW COMPARISON:  07/07/2014 FINDINGS: The heart size and mediastinal contours are within normal limits. Both lungs are clear. The visualized skeletal structures are unremarkable. IMPRESSION: No active cardiopulmonary disease. Electronically Signed   By: Alcide Clever M.D.   On: 02/10/2022 02:22    Procedures Procedures    Medications Ordered in ED Medications  alum & mag hydroxide-simeth (MAALOX/MYLANTA) 200-200-20 MG/5ML suspension 30 mL (30 mLs Oral Given 02/10/22 0736)  lidocaine (XYLOCAINE) 2 % viscous mouth solution 15 mL (15 mLs Mouth/Throat Given 02/10/22 0736)    ED Course/ Medical Decision Making/ A&P Clinical Course as of 02/10/22 0737  Fri Feb 10, 2022  0651 Troponin I (High Sensitivity): 4 [AH]  [phone] EKG 12-Lead [AH]  0651 SARS Coronavirus 2 by RT PCR (hospital order, performed in [facility] hospital lab) *cepheid single result test* Anterior Nasal Swab [AH]  4128 Basic metabolic panel [AH]  0651 CBC [AH]    Clinical Course User Index [AH] Arthor Captain, PA-C           HEART Score: 4                Medical Decision Making Given the large differential diagnosis  for TEYLOR WOLVEN, the decision making in this case is of high complexity.  After evaluating all of the data points in this case, the presentation of ADRIANN THAU is NOT consistent with Acute Coronary Syndrome (ACS) and/or myocardial ischemia, pulmonary embolism, aortic dissection; Borhaave's, significant arrythmia, pneumothorax, cardiac tamponade, or other emergent cardiopulmonary condition.  Further, the presentation of ARDEAN SIMONICH is NOT consistent with pericarditis, myocarditis, cholecystitis, pancreatitis, mediastinitis, endocarditis, new valvular disease.  Additionally, the presentation of DUNIA PRINGLE NOT consistent with flail chest, cardiac contusion, ARDS, or significant intra-thoracic or intra-abdominal bleeding.  Moreover, this presentation is NOT consistent with pneumonia, sepsis,  or pyelonephritis.  Will give cardiology referral for stress testing. Plan is to reinitiate PPI therapy have the patient follow-up with her PCP as well. I suspect this is likely due to GERD given her history.   Strict return and follow-up precautions have been given by me personally or by detailed written instruction given verbally by nursing staff using the teach back method to the patient/family/caregiver(s).  Data Reviewed/Counseling: I have reviewed the patient's vital signs, nursing notes, and other relevant tests/information. I had a detailed discussion regarding the historical points, exam findings, and any diagnostic results supporting the discharge diagnosis. I also discussed the need for outpatient follow-up and the need to return to the ED if symptoms worsen or if there are any questions or concerns that arise at home.    Amount and/or Complexity of Data Reviewed Labs: ordered. Decision-making details documented in ED Course.    Details: Labs unremarkable, 2 negative troponins Radiology: ordered and independent interpretation performed.    Details: I visualized and interpreted chest  x-ray which shows no acute finding ECG/medicine tests: independent interpretation performed. Decision-making details documented in ED Course.    Details: Normal sinus rhythm at a rate of 67  Risk OTC drugs. Prescription drug management. Risk Details: Moderate risk heart score.  2 negative troponin troponins.           Final Clinical Impression(s) / ED Diagnoses Final diagnoses:  Atypical chest pain    Rx / DC Orders ED Discharge Orders          Ordered    pantoprazole (PROTONIX) 20 MG tablet  Daily        02/10/22 0737    Ambulatory referral to Cardiology       Comments: If you have not heard from the Cardiology office within the next 72 hours please call [phone].   02/10/22 0737              Arthor Captain, PA-C 02/10/22 8756    Jacalyn Lefevre, MD 02/10/22 [phone]

## 2022-02-17 ENCOUNTER — Ambulatory Visit (INDEPENDENT_AMBULATORY_CARE_PROVIDER_SITE_OTHER): Payer: Medicare HMO

## 2022-02-17 ENCOUNTER — Ambulatory Visit (INDEPENDENT_AMBULATORY_CARE_PROVIDER_SITE_OTHER): Payer: Medicare HMO | Admitting: Orthopaedic Surgery

## 2022-02-17 ENCOUNTER — Encounter: Payer: Self-pay | Admitting: Orthopaedic Surgery

## 2022-02-17 DIAGNOSIS — Z96641 Presence of right artificial hip joint: Secondary | ICD-10-CM

## 2022-02-17 DIAGNOSIS — M25561 Pain in right knee: Secondary | ICD-10-CM | POA: Diagnosis not present

## 2022-02-17 MED ORDER — LIDOCAINE HCL 1 % IJ SOLN
2.0000 mL | INTRAMUSCULAR | Status: AC | PRN
  Administered 2022-02-17: 2 mL

## 2022-02-17 MED ORDER — METHYLPREDNISOLONE ACETATE 40 MG/ML IJ SUSP
40.0000 mg | INTRAMUSCULAR | Status: AC | PRN
  Administered 2022-02-17: 40 mg via INTRA_ARTICULAR

## 2022-02-17 MED ORDER — BUPIVACAINE HCL 0.5 % IJ SOLN
2.0000 mL | INTRAMUSCULAR | Status: AC | PRN
  Administered 2022-02-17: 2 mL via INTRA_ARTICULAR

## 2022-02-17 NOTE — Progress Notes (Signed)
Office Visit Note   Patient: [name]           Date of Birth: 08-24-1955           MRN: [id number] Visit Date: 02/17/2022              Requested by: Stephanie Seller, NP [address] [place]. [place],  [place] [address] PCP: Stephanie Seller, NP   Assessment & Plan: Visit Diagnoses:  1. Acute pain of right knee   2. History of total hip replacement, right     Plan: In regards to the right hip replacement this is doing well for her and she is very happy.  The problem is her knee.  Based on treatment options she elected for to undergo a cortisone injection today.  We will see her back as needed for the knee.  Follow-up as scheduled for the hip.  Follow-Up Instructions: No follow-ups on file.   Orders:  Orders Placed This Encounter  Procedures   XR Pelvis 1-2 Views   XR KNEE 3 VIEW RIGHT   No orders of the defined types were placed in this encounter.     Procedures: Large Joint Inj: R knee on 02/17/2022 7:28 PM Indications: pain Details: 22 G needle  Arthrogram: No  Medications: 40 mg methylPREDNISolone acetate 40 MG/ML; 2 mL lidocaine 1 %; 2 mL bupivacaine 0.5 % Consent was given by the patient. Patient was prepped and draped in the usual sterile fashion.       Clinical Data: No additional findings.   Subjective: Chief Complaint  Patient presents with   Right Knee - Pain    HPI [name] comes in today for recheck of right hip status post total hip replacement on 09/21/2021.  Her right hip is doing well.  She has had right knee pain for 2 days.  Denies any trauma.  This is causing her to limp.  Has pain throughout the knee.  Took a muscle relaxer and tramadol which did not help.  Review of Systems  Constitutional: Negative.   HENT: Negative.    Eyes: Negative.   Respiratory: Negative.    Cardiovascular: Negative.   Endocrine: Negative.   Musculoskeletal: Negative.   Neurological: Negative.   Hematological: Negative.   Psychiatric/Behavioral: Negative.     All other systems reviewed and are negative.    Objective: Vital Signs: There were no vitals taken for this visit.  Physical Exam Vitals and nursing note reviewed.  Constitutional:      Appearance: She is well-developed.  HENT:     Head: Normocephalic and atraumatic.  Pulmonary:     Effort: Pulmonary effort is normal.  Abdominal:     Palpations: Abdomen is soft.  Musculoskeletal:     Cervical back: Neck supple.  Skin:    General: Skin is warm.     Capillary Refill: Capillary refill takes less than 2 seconds.  Neurological:     Mental Status: She is alert and oriented to person, place, and time.  Psychiatric:        Behavior: Behavior normal.        Thought Content: Thought content normal.        Judgment: Judgment normal.     Ortho Exam Examination of the right hip is unremarkable.  Fully healed surgical scar. Examination of right knee shows crepitus with range of motion.  No joint effusion.  Medial joint line tenderness.  Collaterals and cruciates are stable. Specialty Comments:  No specialty comments available.  Imaging: XR Pelvis 1-2 Views  Result Date: 02/17/2022 Stable total hip replacement without complications  XR KNEE 3 VIEW RIGHT  Result Date: 02/17/2022 Moderate tricompartmental DJD.  Periarticular spurring.  Joint space narrowing.    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   Primary osteoarthritis of right hip 03/24/2021   TRIGGER FINGER, LEFT THUMB 08/12/2009   Anxiety state 05/20/2009   CANDIDIASIS, SKIN 03/12/2009   TINGLING 12/24/2008   CHEST PAIN 02/03/2008   HYPOKALEMIA 11/07/2007   Other specified disorders of bladder 09/26/2007   HEADACHE 08/07/2007   CONSTIPATION 07/23/2007   MICROSCOPIC HEMATURIA 07/23/2007   DENTAL CARIES 05/27/2007   TINEA PEDIS 05/23/2007   Allergic rhinitis 05/23/2007   GERD 05/23/2007   HIATAL HERNIA 05/23/2007   DEGENERATIVE  JOINT DISEASE, LEFT KNEE 05/23/2007   ESSENTIAL HYPERTENSION, BENIGN 05/20/2007   Past Medical History:  Diagnosis Date   Acid reflux    [facility]   Alcohol use    Per Records received from [facility]- pt states she only has a [place] of wine occasionally   Arthritis    Knee and hip   BMI 36.0-36.9,adult    Per Records received from [facility]   Chronic knee pain    L & R   GERD (gastroesophageal reflux disease)    Heart murmur, systolic 09/19/2021   1/6 systolic   Hyperlipidemia    Hypertension    Morbid obesity (HCC)    Per Records received from [facility]   Other osteoarthritis involving multiple joints    Per Records received from [employer]   Prediabetes    Per Records received from [facility]   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[facility])    Per records from [employer] [facility] the [facility]   Right hip pain    Per records from [employer] of the [facility]   Right thigh pain 2022   Per records from [employer] [facility] the [place]   Vitamin D deficiency    Per Records received from [facility]    Family History  Problem Relation Age of Onset   Hypertension Mother        Per records from [facility] [place]   Cancer Mother 37       colon   Colon cancer Mother        Per records from [facility] [place]   Cancer Father 72       lung   Hypertension Father        Per records from [facility] [place]   Lung disease Father        Per records from [employer] [facility] the [facility]   Seizures Brother    Epilepsy Brother    Drug abuse Daughter    Other Daughter        Murder in 2014    Past Surgical History:  Procedure Laterality Date   JOINT REPLACEMENT Left 07/11/2018   knee replacement   TOE SURGERY Right    bone spur "shaved" down- many years ago   TOTAL HIP ARTHROPLASTY Right 09/21/2021   Procedure: RIGHT TOTAL HIP ARTHROPLASTY ANTERIOR APPROACH;   Surgeon: Tarry Kos, MD;  Location: [facility] OR;  Service: Orthopedics;  Laterality: Right;   TUBAL LIGATION  1978   Per records from [facility] [place]  [place]   Social History   Occupational History   Not on file  Tobacco Use   Smoking status: Never   Smokeless tobacco: Never  Vaping Use   Vaping Use: Never used  Substance and Sexual Activity   Alcohol use: Yes    Comment: pt states she may have 1 D[place]worth of wine on special occasions   Drug use: No   Sexual activity: Not on file

## 2022-02-27 ENCOUNTER — Ambulatory Visit (INDEPENDENT_AMBULATORY_CARE_PROVIDER_SITE_OTHER): Payer: Medicare HMO | Admitting: Internal Medicine

## 2022-02-27 ENCOUNTER — Encounter: Payer: Self-pay | Admitting: Internal Medicine

## 2022-02-27 VITALS — BP 128/60 | HR 65 | Ht 59.0 in | Wt 146.0 lb

## 2022-02-27 DIAGNOSIS — R072 Precordial pain: Secondary | ICD-10-CM | POA: Diagnosis not present

## 2022-02-27 DIAGNOSIS — I10 Essential (primary) hypertension: Secondary | ICD-10-CM

## 2022-02-27 DIAGNOSIS — K219 Gastro-esophageal reflux disease without esophagitis: Secondary | ICD-10-CM

## 2022-02-27 DIAGNOSIS — E785 Hyperlipidemia, unspecified: Secondary | ICD-10-CM

## 2022-02-27 MED ORDER — ROSUVASTATIN CALCIUM 20 MG PO TABS: 20.0000 mg | ORAL_TABLET | Freq: Every day | ORAL | 3 refills | Status: DC

## 2022-02-27 NOTE — Patient Instructions (Signed)
Medication Instructions:  Your physician has recommended you make the following change in your medication:  1) increase rosuvastatin (Crestor) to 40 mg - one tablet daily  *If you need a refill on your cardiac medications before your next appointment, please call your pharmacy*   Lab Work: Please return in 2 months for lab work (Lipids/liver/Lp(a)  If you have labs (blood work) drawn today and your tests are completely normal, you will receive your results only by: MyChart Message (if you have MyChart) OR A paper copy in the mail If you have any lab test that is abnormal or we need to change your treatment, we will call you to review the results.   Testing/Procedures: none   Follow-Up: At [facility], you and your health needs are our priority.  As part of our continuing mission to provide you with exceptional heart care, we have created designated Provider Care Teams.  These Care Teams include your primary Cardiologist (physician) and Advanced Practice Providers (APPs -  Physician Assistants and Nurse Practitioners) who all work together to provide you with the care you need, when you need it.   Your next appointment:   18 month(s)  The format for your next appointment:   In Person  Provider:   None     Important Information About Sugar

## 2022-02-27 NOTE — Progress Notes (Signed)
Cardiology Office Note:    Date:  02/27/2022   ID:  AWA BACHICHA, DOB 1955/10/15, MRN [id number]  PCP:  [name], NP   [name] Cardiologist:  Alverda Skeans, MD Referring MD: Arthor Captain, PA-C   Chief Complaint/Reason for Referral: Chest pain  ASSESSMENT:    1. Precordial pain   2. Hyperlipidemia LDL goal <70   3. Primary hypertension   4. Gastroesophageal reflux disease, unspecified whether esophagitis present     PLAN:    In order of problems listed above: 1.  Chest pain: We will obtain coronary CTA and echocardiogram to evaluate further.  This is likely due to GERD.  She has been asymptomatic since restarting pantoprazole.  Monitor for now.  She does not currently have an indication for aspirin.  She is not a diabetic, has no peripheral vascular disease, and has no history or imaging evidence of coronary or aortic atherosclerosis.  Follow-up in 18 months or earlier if needed.   2.  Hyperlipidemia: Based on the patient's ASCVD 10-year risk score is intermediate and greater than 7.5%.  Increase Crestor to 20 mg and check lipid panel, LFTs, and LP(a) in 2 months. 3.  Hypertension: Blood pressures well controlled on her current regimen 4.  GERD: Continue pantoprazole.             Dispo:  Return in about 18 months (around 08/31/2023).      Medication Adjustments/Labs and Tests Ordered: Current medicines are reviewed at length with the patient today.  Concerns regarding medicines are outlined above.  The following changes have been made:     Labs/tests ordered: Orders Placed This Encounter  Procedures   Hepatic function panel   Lipid panel   Lipoprotein A (LPA)    Medication Changes: Meds ordered this encounter  Medications   rosuvastatin (CRESTOR) 20 MG tablet    Sig: Take 1 tablet (20 mg total) by mouth daily.    Dispense:  90 tablet    Refill:  3    Dose increase     Current medicines are reviewed at length with the patient  today.  The patient does not have concerns regarding medicines.   History of Present Illness:    FOCUSED PROBLEM LIST:   1.  Hyperlipidemia 2.  Hypertension 3.  GERD 4.  Aspirin intolerance due to GI symptoms  The patient is a 66 y.o. female with the indicated medical history here for emergency room follow-up regarding presentation for chest pain.  She was seen recently in the emergency department for chest pain.  This was a few weeks ago.  Currently she was at work and the onset of chest tightness for about an hour.  She is also somewhat short of breath.  This is reminiscent of her symptoms of GERD.  She is not on any medications for GERD at that time as it has been stopped.  Her EKG in the emergency department was unremarkable her cardiac biomarkers were unremarkable.  A chest x-ray was unremarkable.  She was started on pantoprazole.  Since starting pantoprazole the patient denies any chest pain symptoms.  She denies any claudication, dyspnea on exertion, presyncope, palpitations, paroxysmal nocturnal dyspnea, orthopnea.  She has never had a stroke.  She denies any significant or severe bleeding or bruising.  She is otherwise well without significant complaints today.         Current Medications: Current Meds  Medication Sig   CALCIUM-VITAMIN D PO Take 1 tablet by mouth daily.  lisinopril-hydrochlorothiazide (ZESTORETIC) 20-12.5 MG tablet Take 1 tablet by mouth daily.   Multiple Vitamin (MULTIVITAMIN WITH MINERALS) TABS tablet Take 1 tablet by mouth daily.   pantoprazole (PROTONIX) 20 MG tablet Take 1 tablet (20 mg total) by mouth daily.   rosuvastatin (CRESTOR) 20 MG tablet Take 1 tablet (20 mg total) by mouth daily.   traMADol (ULTRAM) 50 MG tablet Take 25 mg by mouth as needed.   [DISCONTINUED] rosuvastatin (CRESTOR) 5 MG tablet Take 5 mg by mouth at bedtime.     Allergies:    Patient has no active allergies.   Social History:   Social History   Tobacco Use   Smoking status:  Never   Smokeless tobacco: Never  Vaping Use   Vaping Use: Never used  Substance Use Topics   Alcohol use: Yes    Comment: pt states she may have 1 [place] of wine on special occasions   Drug use: No     Family Hx: Family History  Problem Relation Age of Onset   Hypertension Mother        [facility] from [facility] [place]   Cancer Mother 75       colon   Colon cancer Mother        Per [facility] from [facility] [place]   Cancer Father 72       lung   Hypertension Father        Per records from [employer] [facility] the [place]   Lung disease Father        Per records from [employer] of the [facility]   Seizures Brother    Epilepsy Brother    Drug abuse Daughter    Other Daughter        Murder in 2014     Review of Systems:   Please see the history of present illness.    All other systems reviewed and are negative.     EKGs/Labs/Other Test Reviewed:    EKG:  EKG performed August 2023 that I personally reviewed demonstrates sinus rhythm.  Prior CV studies: None available  Other studies Reviewed: Review of the additional studies/records demonstrates: CT abdomen pelvis 2009 without aortic atherosclerosis  Recent Labs: 11/18/2021: ALT 9 02/10/2022: BUN 13; Creatinine, Ser 0.73; Hemoglobin 12.6; Platelets 254; Potassium 3.8; Sodium 139   Recent Lipid Panel Lab Results  Component Value Date/Time   CHOL 176 11/18/2021 02:41 PM   TRIG 131 11/18/2021 02:41 PM   HDL 57 11/18/2021 02:41 PM   LDLCALC 96 11/18/2021 02:41 PM    Risk Assessment/Calculations:                Physical Exam:    VS:  BP 128/60   Pulse 65   Ht 4\' 11"  (1.499 m)   Wt 146 lb (66.2 kg)   SpO2 97%   BMI 29.49 kg/m    Wt Readings from Last 3 Encounters:  02/27/22 146 lb (66.2 kg)  11/18/21 162 lb 11.2 oz (73.8 kg)  09/21/21 159 lb (72.1 kg)    GENERAL:  No apparent distress, AOx3 HEENT:  No carotid bruits, +2 carotid impulses, no  scleral icterus CAR: RRR no murmurs, gallops, rubs, or thrills RES:  Clear to auscultation bilaterally ABD:  Soft, nontender, nondistended, positive bowel sounds x 4 VASC:  +2 radial pulses, +2 carotid pulses, palpable pedal pulses NEURO:  CN 2-12 grossly intact; motor and sensory grossly intact PSYCH:  No active depression  or anxiety EXT:  No edema, ecchymosis, or cyanosis  Signed, Orbie Pyo, MD  02/27/2022 10:07 AM    [facility] [address] [place], [place], [place]  [address] Phone: [phone]; Fax: [phone]   Note:  This document was prepared using Dragon voice recognition software and may include unintentional dictation errors.

## 2022-03-01 ENCOUNTER — Other Ambulatory Visit: Payer: Self-pay

## 2022-03-01 MED ORDER — PANTOPRAZOLE SODIUM 20 MG PO TBEC: 20.0000 mg | DELAYED_RELEASE_TABLET | Freq: Every day | ORAL | 1 refills | Status: DC

## 2022-03-01 MED ORDER — ROSUVASTATIN CALCIUM 20 MG PO TABS: 20.0000 mg | ORAL_TABLET | Freq: Every day | ORAL | 1 refills | Status: DC

## 2022-03-01 MED ORDER — LISINOPRIL-HYDROCHLOROTHIAZIDE 20-12.5 MG PO TABS: 1.0000 | ORAL_TABLET | Freq: Every day | ORAL | 1 refills | Status: DC

## 2022-03-06 ENCOUNTER — Other Ambulatory Visit: Payer: Self-pay | Admitting: Physician Assistant

## 2022-03-06 ENCOUNTER — Telehealth: Payer: Self-pay | Admitting: Orthopaedic Surgery

## 2022-03-06 MED ORDER — HYDROCODONE-ACETAMINOPHEN 5-325 MG PO TABS: 1.0000 | ORAL_TABLET | Freq: Two times a day (BID) | ORAL | 0 refills | Status: DC | PRN

## 2022-03-06 NOTE — Telephone Encounter (Signed)
I sent in a small rx for norco

## 2022-03-06 NOTE — Telephone Encounter (Signed)
Pt called and states her knee is in a lot of pain but the tramadol is not even helping her so shes not even taking it. Can she get something different   CB [phone]

## 2022-04-12 ENCOUNTER — Other Ambulatory Visit: Payer: Self-pay | Admitting: Nurse Practitioner

## 2022-04-20 ENCOUNTER — Ambulatory Visit: Payer: Medicare HMO | Admitting: Podiatry

## 2022-04-20 DIAGNOSIS — M778 Other enthesopathies, not elsewhere classified: Secondary | ICD-10-CM

## 2022-04-24 ENCOUNTER — Telehealth: Payer: Self-pay | Admitting: *Deleted

## 2022-04-24 NOTE — Telephone Encounter (Signed)
Patient  calling to get address and number to [facility], information given.

## 2022-04-27 ENCOUNTER — Encounter: Payer: Self-pay | Admitting: Nurse Practitioner

## 2022-04-27 ENCOUNTER — Ambulatory Visit (INDEPENDENT_AMBULATORY_CARE_PROVIDER_SITE_OTHER): Payer: Medicare HMO | Admitting: Nurse Practitioner

## 2022-04-27 ENCOUNTER — Telehealth: Payer: Self-pay

## 2022-04-27 DIAGNOSIS — Z1211 Encounter for screening for malignant neoplasm of colon: Secondary | ICD-10-CM

## 2022-04-27 DIAGNOSIS — Z1212 Encounter for screening for malignant neoplasm of rectum: Secondary | ICD-10-CM

## 2022-04-27 DIAGNOSIS — Z0000 Encounter for general adult medical examination without abnormal findings: Secondary | ICD-10-CM

## 2022-04-27 NOTE — Progress Notes (Signed)
Subjective:   [name] is a 66 y.o. female who presents for Medicare Annual (Subsequent) preventive examination.  Review of Systems     Cardiac Risk Factors include: advanced age (>18men, >27 women);dyslipidemia;hypertension     Objective:    There were no vitals filed for this visit. There is no height or weight on file to calculate BMI.     04/27/2022    8:14 AM 02/10/2022    1:55 AM 11/18/2021    8:58 AM 09/19/2021    9:46 AM 05/27/2021    9:46 AM 04/25/2021    9:07 AM 03/09/2021   10:29 AM  Advanced Directives  Does Patient Have a Medical Advance Directive? No No No No No No No  Does patient want to make changes to medical advance directive?      No - Patient declined   Would patient like information on creating a medical advance directive? No - Patient declined  No - Patient declined No - Patient declined   No - Patient declined    Current Medications (verified) Outpatient Encounter Medications as of 04/27/2022  Medication Sig   CALCIUM-VITAMIN D PO Take 1 tablet by mouth daily.   HYDROcodone-acetaminophen (NORCO) 5-325 MG tablet Take 1 tablet by mouth 2 (two) times daily as needed.   ibuprofen (ADVIL) 400 MG tablet Take 400 mg by mouth as needed.   lisinopril-hydrochlorothiazide (ZESTORETIC) 20-12.5 MG tablet Take 1 tablet by mouth daily.   Multiple Vitamin (MULTIVITAMIN WITH MINERALS) TABS tablet Take 1 tablet by mouth daily.   pantoprazole (PROTONIX) 20 MG tablet Take 1 tablet (20 mg total) by mouth daily.   rosuvastatin (CRESTOR) 20 MG tablet Take 1 tablet (20 mg total) by mouth daily.   traMADol (ULTRAM) 50 MG tablet Take 25 mg by mouth as needed.   No facility-administered encounter medications on file as of 04/27/2022.    Allergies (verified) Patient has no known allergies.   History: Past Medical History:  Diagnosis Date   Acid reflux    [facility]   Alcohol use    Per Records received from [facility]-  pt states she only has a Kluever of wine occasionally   Arthritis    Knee and hip   BMI 36.0-36.9,adult    Per Records received from [facility]   Chronic knee pain    L & R   GERD (gastroesophageal reflux disease)    Heart murmur, systolic 09/19/2021   1/6 systolic   Hyperlipidemia    Hypertension    Morbid obesity (HCC)    Per Records received from [facility]   Other osteoarthritis involving multiple joints    Per Records received from [employer]   Prediabetes    Per Records received from [facility]   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[facility])    Per records from [employer] [facility]   Right hip pain    Per records from [employer] [facility]   Right thigh pain 2022   Per records from [employer] [facility]   Vitamin D deficiency    Per Records received from [facility]   Past Surgical History:  Procedure Laterality Date   JOINT REPLACEMENT Left 07/11/2018   knee replacement   TOE SURGERY Right    bone spur "shaved" down- many years ago   TOTAL HIP ARTHROPLASTY 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/2021   Procedure: RIGHT TOTAL HIP ARTHROPLASTY ANTERIOR APPROACH;  Surgeon: Tarry Kos,  MD;  Location: [place];  Service: Orthopedics;  Laterality: Right;   TUBAL LIGATION  1978   Per records from [facility]   Family History  Problem Relation Age of Onset   Hypertension Mother        Per records from [place] Mother 17       colon   Colon cancer Mother        Per records from [place] Father 72       lung   Hypertension Father        Per records from [place] Father        Per records from [place]   Seizures Brother    Epilepsy Brother    Drug abuse Daughter    Other Daughter        Murder in 2014   Social History   Socioeconomic History   Marital status: Divorced    Spouse  name: Not on file   Number of children: 4   Years of education: Not on file   Highest education level: Not on file  Occupational History   Not on file  Tobacco Use   Smoking status: Never   Smokeless tobacco: Never  Vaping Use   Vaping Use: Never used  Substance and Sexual Activity   Alcohol use: Yes    Comment: pt states she may have 1 Ruddy of wine on special occasions   Drug use: No   Sexual activity: Not on file  Other Topics Concern   Not on file  Social History Narrative   Diet: none      Caffeine: yes      Married, if yes what year: Divorced, 1972      Do you live in a house, apartment, assisted living, condo, trailer, ect: Apartment      Is it one or more stories:       How many persons live in your home? 1      Pets: yes ( shih-tzu)      Highest level or education completed: 10th      Current/Past profession:      Exercise:  No                Type and how often:          Living Will: No     DNR: No     POA/HPOA: No        Functional Status:   Do you have difficulty bathing or dressing yourself? No     Do you have difficulty preparing food or eating? No     Do you have difficulty managing your medications? No     Do you have difficulty managing your finances? No     Do you have difficulty affording your medications?  No     Social Determinants of Radio broadcast assistant Strain: Not on file  Food Insecurity: Not on file  Transportation Needs: Not on file  Physical Activity: Not on file  Stress: Not on file  Social Connections: Not on file    Tobacco Counseling Counseling given: Not Answered   Clinical Intake:  Pre-visit preparation completed: Yes        BMI - recorded: 29 Nutritional Status: BMI 25 -29 Overweight Nutritional Risks: Other (Comment) Diabetes: No  How often do you need to have someone help you when you read instructions, pamphlets, or other written materials from your doctor or pharmacy?: 1 -  Never  Diabetic?no         Activities of Daily Living    04/27/2022    9:10 AM 09/19/2021    9:57 AM  In your present state of health, do you have any difficulty performing the following activities:  Hearing? 0   Vision? 0   Difficulty concentrating or making decisions? 0   Walking or climbing stairs? 0   Dressing or bathing? 0   Doing errands, shopping? 0 0  Preparing Food and eating ? N   Using the Toilet? N   In the past six months, have you accidently leaked urine? N   Do you have problems with loss of bowel control? N   Managing your Medications? N   Managing your Finances? N   Housekeeping or managing your Housekeeping? N     Patient Care Team: Lauree Chandler, NP as PCP - General (Geriatric Medicine)  Indicate any recent Medical Services you may have received from other than [facility] providers in the past year (date may be approximate).     Assessment:   This is a routine wellness examination for [place].  Hearing/Vision screen Hearing Screening - Comments:: No hearing issues  Vision Screening - Comments:: Last eye exam less than 12 months ago at [facility]   Dietary issues and exercise activities discussed: Current Exercise Habits: The patient does not participate in regular exercise at present   Goals Addressed   None    Depression Screen    04/27/2022    8:48 AM 04/25/2021    9:06 AM  PHQ 2/9 Scores  PHQ - 2 Score 0 0    Fall Risk    04/27/2022    8:48 AM 11/18/2021    9:46 AM 09/05/2021    9:12 AM 04/25/2021    9:06 AM  [place] in the past year? 0 0 0 0  Number falls in past yr: 0 0 0 0  Injury with Fall? 0 0 0 0  Risk for fall due to : No Fall Risks No Fall Risks No Fall Risks No Fall Risks  Follow up Falls evaluation completed Falls evaluation completed Falls evaluation completed;Education provided;Falls prevention discussed Falls evaluation completed    FALL RISK PREVENTION PERTAINING TO THE HOME:  Any stairs in or  around the home? No  If so, are there any without handrails?  na Home free of loose throw rugs in walkways, pet beds, electrical cords, etc? Yes  Adequate lighting in your home to reduce risk of falls? Yes   ASSISTIVE DEVICES UTILIZED TO PREVENT FALLS:  Life alert? No  Use of a cane, walker or w/c? Yes  Grab bars in the bathroom? No  Shower chair or bench in shower? No  Elevated toilet seat or a handicapped toilet? No   TIMED UP AND GO:  Was the test performed? No .   Cognitive Function:    04/25/2021    9:10 AM  MMSE - Mini Mental State Exam  Orientation to time 5  Orientation to Place 5  Registration 3  Attention/ Calculation 5  Recall 2  Recall-comments missed penny  Language- name 2 objects 2  Language- repeat 1  Language- follow 3 step command 2  Language- follow 3 step command-comments took paper with left hand  Language- read & follow direction 1  Write a sentence 1  Copy design 1  Total score 28        04/27/2022    8:15 AM  6CIT Screen  What Year? 0 points  What month? 0 points  What time? 0 points  Count back from 20 0 points  Months in reverse 0 points  Repeat phrase 2 points  Total Score 2 points    Immunizations Immunization History  Administered Date(s) Administered   Fluad Quad(high Dose 65+) 03/09/2021   Influenza Whole 04/19/2009   Influenza,inj,Quad PF,6+ Mos 05/17/2018   Moderna SARS-COV2 Booster Vaccination 01/14/2021   Moderna Sars-Covid-2 Vaccination 04/16/2020, 05/14/2020   PNEUMOCOCCAL CONJUGATE-20 04/25/2021   Pfizer Covid-19 Vaccine Bivalent Booster 64yrs & up 05/28/2021   Td 08/10/2006    TDAP status: Due, Education has been provided regarding the importance of this vaccine. Advised may receive this vaccine at local pharmacy or Health Dept. Aware to provide a copy of the vaccination record if obtained from local pharmacy or Health Dept. Verbalized acceptance and understanding.  Flu Vaccine status: Due, Education has been  provided regarding the importance of this vaccine. Advised may receive this vaccine at local pharmacy or Health Dept. Aware to provide a copy of the vaccination record if obtained from local pharmacy or Health Dept. Verbalized acceptance and understanding.  Pneumococcal vaccine status: Up to date  Covid-19 vaccine status: Information provided on how to obtain vaccines.   Qualifies for Shingles Vaccine? Yes   Zostavax completed No   Shingrix Completed?: No.    Education has been provided regarding the importance of this vaccine. Patient has been advised to call insurance company to determine out of pocket expense if they have not yet received this vaccine. Advised may also receive vaccine at local pharmacy or Health Dept. Verbalized acceptance and understanding.  Screening Tests Health Maintenance  Topic Date Due   COLONOSCOPY (Pts 45-43yrs Insurance coverage will need to be confirmed)  Never done   Zoster Vaccines- Shingrix (1 of 2) Never done   TETANUS/TDAP  08/10/2016   COVID-19 Vaccine (4 - Moderna series) 09/25/2021   INFLUENZA VACCINE  02/07/2022   MAMMOGRAM  08/30/2023   Pneumonia Vaccine 33+ Years old  Completed   DEXA SCAN  Completed   Hepatitis C Screening  Completed   HPV VACCINES  Aged Out    Health Maintenance  Health Maintenance Due  Topic Date Due   COLONOSCOPY (Pts 45-56yrs Insurance coverage will need to be confirmed)  Never done   Zoster Vaccines- Shingrix (1 of 2) Never done   TETANUS/TDAP  08/10/2016   COVID-19 Vaccine (4 - Moderna series) 09/25/2021   INFLUENZA VACCINE  02/07/2022    Colorectal cancer screening: Referral to GI placed for colonoscopy. Pt aware the office will call re: appt.  Mammogram status: Completed 08/29/21. Repeat every year  Bone Density status: Completed 08/29/21. Results reflect: Bone density results: OSTEOPENIA. Repeat every 5 years.  Lung Cancer Screening: (Low Dose CT Chest recommended if Age 63-80 years, 30 pack-year currently  smoking OR have quit w/in 15years.) does not qualify.   Lung Cancer Screening Referral: na na Additional Screening:  Hepatitis C Screening: does qualify; Completed 2022   Vision Screening: Recommended annual ophthalmology exams for early detection of glaucoma and other disorders of the eye. Is the patient up to date with their annual eye exam?  Yes  Who is the provider or what is the name of the office in which the patient attends annual eye exams? [facility] If pt  is not established with a provider, would they like to be referred to a provider to establish care? No .   Dental Screening: Recommended annual dental exams for proper oral hygiene  Community Resource Referral / Chronic Care Management: CRR required this visit?  No   CCM required this visit?  No      Plan:     I have personally reviewed and noted the following in the patient's chart:   Medical and social history Use of alcohol, tobacco or illicit drugs  Current medications and supplements including opioid prescriptions. Patient is not currently taking opioid prescriptions. Functional ability and status Nutritional status Physical activity Advanced directives List of other physicians Hospitalizations, surgeries, and ER visits in previous 12 months Vitals Screenings to include cognitive, depression, and falls Referrals and appointments  In addition, I have reviewed and discussed with patient certain preventive protocols, quality metrics, and best practice recommendations. A written personalized care plan for preventive services as well as general preventive health recommendations were provided to patient.     Lauree Chandler, NP   04/27/2022    Virtual Visit via Telephone Note  I connected with patient 04/27/22 at  9:00 AM EDT by telephone and verified that I am speaking with the correct person using two identifiers.  Location: Patient: home Provider: [facility]    I discussed the limitations,  risks, security and privacy concerns of performing an evaluation and management service by telephone and the availability of in person appointments. I also discussed with the patient that there may be a patient responsible charge related to this service. The patient expressed understanding and agreed to proceed.   I discussed the assessment and treatment plan with the patient. The patient was provided an opportunity to ask questions and all were answered. The patient agreed with the plan and demonstrated an understanding of the instructions.   The patient was advised to call back or seek an in-person evaluation if the symptoms worsen or if the condition fails to improve as anticipated.  I provided 16 minutes of non-face-to-face time during this encounter.  Carlos American. Harle Battiest Avs printed and mailed

## 2022-04-27 NOTE — Telephone Encounter (Signed)
Ms. [name], [name] are scheduled for a virtual visit with your provider today.    Just as we do with appointments in the office, we must obtain your consent to participate.  Your consent will be active for this visit and any virtual visit you may have with one of our providers in the next 365 days.    If you have a MyChart account, I can also send a copy of this consent to you electronically.  All virtual visits are billed to your insurance company just like a traditional visit in the office.  As this is a virtual visit, video technology does not allow for your provider to perform a traditional examination.  This may limit your provider's ability to fully assess your condition.  If your provider identifies any concerns that need to be evaluated in person or the need to arrange testing such as labs, EKG, etc, we will make arrangements to do so.    Although advances in technology are sophisticated, we cannot ensure that it will always work on either your end or our end.  If the connection with a video visit is poor, we may have to switch to a telephone visit.  With either a video or telephone visit, we are not always able to ensure that we have a secure connection.   I need to obtain your verbal consent now.   Are you willing to proceed with your visit today?   [name] has provided verbal consent on 04/27/2022 for a virtual visit (video or telephone).   Leigh Aurora [place], [place] 04/27/2022  9:44 AM

## 2022-04-27 NOTE — Patient Instructions (Signed)
[name] , Thank you for taking time to come for your Medicare Wellness Visit. I appreciate your ongoing commitment to your health goals. Please review the following plan we discussed and let me know if I can assist you in the future.   Screening recommendations/referrals: Colonoscopy due- referral placed  Mammogram up to date Bone Density up to date Recommended yearly ophthalmology/optometry visit for glaucoma screening and checkup Recommended yearly dental visit for hygiene and checkup  Vaccinations: Influenza vaccine- due annually in September/October Pneumococcal vaccine up to date Tdap vaccine -DUE- recommend to get at your local pharmacy  Shingles vaccine DUE- recommend to get at your local pharmacy    Advanced directives: recommended to completed and bring to office to place on file.   Conditions/risks identified: advance age  Next appointment: yearly, next in person.    Preventive Care 34 Years and Older, Female Preventive care refers to lifestyle choices and visits with your health care provider that can promote health and wellness. What does preventive care include? A yearly physical exam. This is also called an annual well check. Dental exams once or twice a year. Routine eye exams. Ask your health care provider how often you should have your eyes checked. Personal lifestyle choices, including: Daily care of your teeth and gums. Regular physical activity. Eating a healthy diet. Avoiding tobacco and drug use. Limiting alcohol use. Practicing safe sex. Taking low-dose aspirin every day. Taking vitamin and mineral supplements as recommended by your health care provider. What happens during an annual well check? The services and screenings done by your health care provider during your annual well check will depend on your age, overall health, lifestyle risk factors, and family history of disease. Counseling  Your health care provider may ask you questions about  your: Alcohol use. Tobacco use. Drug use. Emotional well-being. Home and relationship well-being. Sexual activity. Eating habits. History of falls. Memory and ability to understand (cognition). Work and work Statistician. Reproductive health. Screening  You may have the following tests or measurements: Height, weight, and BMI. Blood pressure. Lipid and cholesterol levels. These may be checked every 5 years, or more frequently if you are over 28 years old. Skin check. Lung cancer screening. You may have this screening every year starting at age 70 if you have a 30-pack-year history of smoking and currently smoke or have quit within the past 15 years. Fecal occult blood test (FOBT) of the stool. You may have this test every year starting at age 17. Flexible sigmoidoscopy or colonoscopy. You may have a sigmoidoscopy every 5 years or a colonoscopy every 10 years starting at age 72. Hepatitis C blood test. Hepatitis B blood test. Sexually transmitted disease (STD) testing. Diabetes screening. This is done by checking your blood sugar (glucose) after you have not eaten for a while (fasting). You may have this done every 1-3 years. Bone density scan. This is done to screen for osteoporosis. You may have this done starting at age 51. Mammogram. This may be done every 1-2 years. Talk to your health care provider about how often you should have regular mammograms. Talk with your health care provider about your test results, treatment options, and if necessary, the need for more tests. Vaccines  Your health care provider may recommend certain vaccines, such as: Influenza vaccine. This is recommended every year. Tetanus, diphtheria, and acellular pertussis (Tdap, Td) vaccine. You may need a Td booster every 10 years. Zoster vaccine. You may need this after age 60. Pneumococcal 13-valent conjugate (  PCV13) vaccine. One dose is recommended after age 5. Pneumococcal polysaccharide (PPSV23) vaccine.  One dose is recommended after age 8. Talk to your health care provider about which screenings and vaccines you need and how often you need them. This information is not intended to replace advice given to you by your health care provider. Make sure you discuss any questions you have with your health care provider. Document Released: 07/23/2015 Document Revised: 03/15/2016 Document Reviewed: 04/27/2015 Elsevier Interactive Patient Education  2017 [employer] Prevention in the Home Falls can cause injuries. They can happen to people of all ages. There are many things you can do to make your home safe and to help prevent falls. What can I do on the outside of my home? Regularly fix the edges of walkways and driveways and fix any cracks. Remove anything that might make you trip as you walk through a door, such as a raised step or threshold. Trim any bushes or trees on the path to your home. Use bright outdoor lighting. Clear any walking paths of anything that might make someone trip, such as rocks or tools. Regularly check to see if handrails are loose or broken. Make sure that both sides of any steps have handrails. Any raised decks and porches should have guardrails on the edges. Have any leaves, snow, or ice cleared regularly. Use sand or salt on walking paths during winter. Clean up any spills in your garage right away. This includes oil or grease spills. What can I do in the bathroom? Use night lights. Install grab bars by the toilet and in the tub and shower. Do not use towel bars as grab bars. Use non-skid mats or decals in the tub or shower. If you need to sit down in the shower, use a plastic, non-slip stool. Keep the floor dry. Clean up any water that spills on the floor as soon as it happens. Remove soap buildup in the tub or shower regularly. Attach bath mats securely with double-sided non-slip rug tape. Do not have throw rugs and other things on the floor that can make  you trip. What can I do in the bedroom? Use night lights. Make sure that you have a light by your bed that is easy to reach. Do not use any sheets or blankets that are too big for your bed. They should not hang down onto the floor. Have a firm chair that has side arms. You can use this for support while you get dressed. Do not have throw rugs and other things on the floor that can make you trip. What can I do in the kitchen? Clean up any spills right away. Avoid walking on wet floors. Keep items that you use a lot in easy-to-reach places. If you need to reach something above you, use a strong step stool that has a grab bar. Keep electrical cords out of the way. Do not use floor polish or wax that makes floors slippery. If you must use wax, use non-skid floor wax. Do not have throw rugs and other things on the floor that can make you trip. What can I do with my stairs? Do not leave any items on the stairs. Make sure that there are handrails on both sides of the stairs and use them. Fix handrails that are broken or loose. Make sure that handrails are as long as the stairways. Check any carpeting to make sure that it is firmly attached to the stairs. Fix any carpet that is  loose or worn. Avoid having throw rugs at the top or bottom of the stairs. If you do have throw rugs, attach them to the floor with carpet tape. Make sure that you have a light switch at the top of the stairs and the bottom of the stairs. If you do not have them, ask someone to add them for you. What else can I do to help prevent falls? Wear shoes that: Do not have high heels. Have rubber bottoms. Are comfortable and fit you well. Are closed at the toe. Do not wear sandals. If you use a stepladder: Make sure that it is fully opened. Do not climb a closed stepladder. Make sure that both sides of the stepladder are locked into place. Ask someone to hold it for you, if possible. Clearly mark and make sure that you can  see: Any grab bars or handrails. First and last steps. Where the edge of each step is. Use tools that help you move around (mobility aids) if they are needed. These include: Canes. Walkers. Scooters. Crutches. Turn on the lights when you go into a dark area. Replace any light bulbs as soon as they burn out. Set up your furniture so you have a clear path. Avoid moving your furniture around. If any of your floors are uneven, fix them. If there are any pets around you, be aware of where they are. Review your medicines with your doctor. Some medicines can make you feel dizzy. This can increase your chance of falling. Ask your doctor what other things that you can do to help prevent falls. This information is not intended to replace advice given to you by your health care provider. Make sure you discuss any questions you have with your health care provider. Document Released: 04/22/2009 Document Revised: 12/02/2015 Document Reviewed: 07/31/2014 Elsevier Interactive Patient Education  2017 [employer].

## 2022-04-27 NOTE — Progress Notes (Signed)
   This service is provided via telemedicine  No vital signs collected/recorded due to the encounter was a telemedicine visit.   Location of patient (ex: home, work):  Home  Patient consents to a telephone visit: Yes, see telephone visit dated 04/27/22  Location of the provider (ex: office, home):  [place], [place] Location   Name of any referring provider:  N/A  Names of all persons participating in the telemedicine service and their role in the encounter:  S.Chrae B/CMA, Sherrie Mustache, NP, and Patient   Time spent on call:  9 min with medical assistant

## 2022-05-01 ENCOUNTER — Other Ambulatory Visit: Payer: Medicare HMO

## 2022-05-22 ENCOUNTER — Ambulatory Visit (INDEPENDENT_AMBULATORY_CARE_PROVIDER_SITE_OTHER): Payer: Medicare HMO | Admitting: Nurse Practitioner

## 2022-05-22 ENCOUNTER — Encounter: Payer: Self-pay | Admitting: Nurse Practitioner

## 2022-05-22 VITALS — BP 132/84 | HR 70 | Temp 97.5°F | Ht 59.0 in | Wt 155.0 lb

## 2022-05-22 DIAGNOSIS — K219 Gastro-esophageal reflux disease without esophagitis: Secondary | ICD-10-CM | POA: Diagnosis not present

## 2022-05-22 DIAGNOSIS — E782 Mixed hyperlipidemia: Secondary | ICD-10-CM | POA: Diagnosis not present

## 2022-05-22 DIAGNOSIS — Z23 Encounter for immunization: Secondary | ICD-10-CM | POA: Diagnosis not present

## 2022-05-22 DIAGNOSIS — I10 Essential (primary) hypertension: Secondary | ICD-10-CM | POA: Diagnosis not present

## 2022-05-22 DIAGNOSIS — M1611 Unilateral primary osteoarthritis, right hip: Secondary | ICD-10-CM

## 2022-05-22 DIAGNOSIS — R739 Hyperglycemia, unspecified: Secondary | ICD-10-CM

## 2022-05-22 DIAGNOSIS — F411 Generalized anxiety disorder: Secondary | ICD-10-CM | POA: Diagnosis not present

## 2022-05-22 DIAGNOSIS — M79674 Pain in right toe(s): Secondary | ICD-10-CM

## 2022-05-22 NOTE — Patient Instructions (Signed)
To call and schedule appt for colonoscopy  [facility] Gastroenterologist in [place], [place] Get online care: [email or web address] Located in: [place] [address] Address: [address], [place], [place] [address] Phone: [phone]

## 2022-05-22 NOTE — Progress Notes (Unsigned)
Careteam: Patient Care Team: Lauree Chandler, NP as PCP - General (Geriatric Medicine)  PLACE OF SERVICE:  [place] Directive information Does Patient Have a Medical Advance Directive?: No, Would patient like information on creating a medical advance directive?: No - Patient declined  No Known Allergies  Chief Complaint  Patient presents with   Medical Management of Chronic Issues    Routine visit. Discuss need for colonoscopy, shingrix, td/tdap, and covid boost. Discuss flu vaccine recommendation, patient states she feels like she has had a URI for a few days.      HPI: Patient is a 66 y.o. female for routine follow up.  Hip is doing well but having more pain in her right knee. She has already had a left knee replacement.  She has tramadol that she uses rarely, uses tylenol PRN.  She has been seeing podiatrist, thought she was having gout. Was supposed to have xray but did not get this done.  Not having any pain in toe at this time.   Blood pressure well controlled.   Has been feeling a little bit more fatigue over the last few days but overall feels fine, slight cough.    GERD- no more symptoms since she has been on protonix  Hyperlipidemia- crestor was increased to 20 mg daily   Review of Systems:  Review of Systems  Constitutional:  Negative for chills, fever and weight loss.  HENT:  Negative for tinnitus.   Respiratory:  Negative for cough, sputum production and shortness of breath.   Cardiovascular:  Negative for chest pain, palpitations and leg swelling.  Gastrointestinal:  Negative for abdominal pain, constipation, diarrhea and heartburn.  Genitourinary:  Negative for dysuria, frequency and urgency.  Musculoskeletal:  Positive for joint pain. Negative for back pain, falls and myalgias.  Skin: Negative.   Neurological:  Negative for dizziness and headaches.  Psychiatric/Behavioral:  Negative for depression and memory loss. The patient does not  have insomnia.     Past Medical History:  Diagnosis Date   Acid reflux    Per records from [place]   Alcohol use    Per Records received from [facility]- pt states she only has a Levi of wine occasionally   Arthritis    Knee and hip   BMI 36.0-36.9,adult    [facility]   Chronic knee pain    L & R   GERD (gastroesophageal reflux disease)    Heart murmur, systolic 29/79/8921   1/6 systolic   Hyperlipidemia    Hypertension    Morbid obesity ([place])    Per [facility] received from [facility]   Other osteoarthritis involving multiple joints    Per Records received from [facility]   Prediabetes    [facility]   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[facility])    Per records from [place]   Right hip pain    Per records from [place]   Right thigh pain 2022   Per records from [place]   Vitamin D deficiency    Per Records received from [facility]   Past Surgical History:  Procedure Laterality Date   JOINT REPLACEMENT Left 07/11/2018   knee replacement   TOE SURGERY Right    bone spur "shaved" down- many years ago   [place] Right 09/21/2021   Procedure: RIGHT TOTAL  HIP ARTHROPLASTY ANTERIOR APPROACH;  Surgeon: Leandrew Koyanagi, MD;  Location: [place];  Service: Orthopedics;  Laterality: Right;   TUBAL LIGATION  1978   Per records from [place] History:   reports that she has never smoked. She has never used smokeless tobacco. She reports current alcohol use. She reports that she does not use drugs.  Family History  Problem Relation Age of Onset   Hypertension Mother        Per records from [place] Mother 24       colon   Colon cancer Mother        Per records from [place] Father 72       lung    Hypertension Father        Per records from [place] disease Father        Per records from [place]   Seizures Brother    Epilepsy Brother    Drug abuse Daughter    Other Daughter        Murder in 2014    Medications: Patient's Medications  New Prescriptions   No medications on file  Previous Medications   CALCIUM-VITAMIN D PO    Take 1 tablet by mouth daily.   LISINOPRIL-HYDROCHLOROTHIAZIDE (ZESTORETIC) 20-12.5 MG TABLET    Take 1 tablet by mouth daily.   MULTIPLE VITAMIN (MULTIVITAMIN WITH MINERALS) TABS TABLET    Take 1 tablet by mouth daily.   PANTOPRAZOLE (PROTONIX) 20 MG TABLET    Take 1 tablet (20 mg total) by mouth daily.   ROSUVASTATIN (CRESTOR) 20 MG TABLET    Take 1 tablet (20 mg total) by mouth daily.   TRAMADOL (ULTRAM) 50 MG TABLET    Take 25 mg by mouth as needed.  Modified Medications   No medications on file  Discontinued Medications   HYDROCODONE-ACETAMINOPHEN (NORCO) 5-325 MG TABLET    Take 1 tablet by mouth 2 (two) times daily as needed.   IBUPROFEN (ADVIL) 400 MG TABLET    Take 400 mg by mouth as needed.    Physical Exam:  Vitals:   05/22/22 1348  BP: 132/84  Pulse: 70  Temp: (!) 97.5 F (36.4 C)  TempSrc: Temporal  SpO2: 98%  Weight: 155 lb (70.3 kg)  Height: _0  (1.499 m)   Body mass index is 31.31 kg/m. Wt Readings from Last 3 Encounters:  05/22/22 155 lb (70.3 kg)  02/27/22 146 lb (66.2 kg)  11/18/21 162 lb 11.2 oz (73.8 kg)    Physical Exam Constitutional:      General: She is not in acute distress.    Appearance: She is well-developed. She is not diaphoretic.  HENT:     Head: Normocephalic and atraumatic.     Mouth/Throat:     Pharynx: No oropharyngeal exudate.  Eyes:     Conjunctiva/sclera: Conjunctivae normal.     Pupils: Pupils are equal, round, and reactive to light.  Cardiovascular:     Rate and Rhythm: Normal rate and regular rhythm.     Heart sounds:  Normal heart sounds.  Pulmonary:     Effort: Pulmonary effort is normal.     Breath sounds: Normal breath sounds.  Abdominal:     General: Bowel sounds are normal.     Palpations: Abdomen is soft.  Musculoskeletal:  Cervical back: Normal range of motion and neck supple.     Right lower leg: No edema.     Left lower leg: No edema.  Skin:    General: Skin is warm and dry.  Neurological:     Mental Status: She is alert.  Psychiatric:        Mood and Affect: Mood normal.     Labs reviewed: Basic Metabolic Panel: Recent Labs    09/22/21 0545 11/18/21 1441 02/10/22 0204  NA 138 139 139  K 3.3* 4.0 3.8  CL 103 104 105  CO2 _0 GLUCOSE 120* 118 95  BUN _1 CREATININE 0.68 0.61 0.73  CALCIUM 8.5* 9.3 9.7   Liver Function Tests: Recent Labs    05/27/21 1027 09/19/21 1003 11/18/21 1441  AST _2 ALT _3 ALKPHOS  --  56  --   BILITOT 0.3 0.7 0.5  PROT 6.9 7.3 7.0  ALBUMIN  --  3.8  --    No results for input(s): "LIPASE", "AMYLASE" in the last 8760 hours. No results for input(s): "AMMONIA" in the last 8760 hours. CBC: Recent Labs    09/19/21 1003 09/22/21 0545 11/18/21 1441 02/10/22 0204  WBC 8.1 10.0 6.9 6.7  NEUTROABS 4.0  --  3,450  --   HGB 12.3 10.1* 11.7 12.6  HCT 38.1 31.4* 36.6 38.6  MCV 94.1 93.5 90.8 90.8  PLT 274 177 220 254   Lipid Panel: Recent Labs    11/18/21 1441  CHOL 176  HDL 57  LDLCALC 96  TRIG 131  CHOLHDL 3.1   TSH: No results for input(s): "TSH" in the last 8760 hours. A1C: Lab Results  Component Value Date   HGBA1C 5.9 (H) 11/18/2021     Assessment/Plan 1. Essential hypertension, benign -Blood pressure well controlled, goal bp <140/90 Continue current medications and dietary modifications follow metabolic panel  2. Gastroesophageal reflux disease without esophagitis Controlled on protonix  3. Mixed hyperlipidemia -continues on crestor with dietary modifications - Lipid panel - CMP with  eGFR([facility])  4. Anxiety state -stable, continue lifestyle modifications  5. Hyperglycemia -dietary modifications encouraged - Hemoglobin A1c  6. Primary osteoarthritis of right hip Doing well at this time, no significant pain since her hip replacement  7. Pain of right great toe -stable however she is not sure if she has hx of gout- thought that she may have been told this -will follow up uric acid level - Uric Acid  8. Need for influenza vaccination - Flu Vaccine QUAD High Dose(Fluad)   Return in about 6 months (around 11/20/2022) for routine follow up. [employer]. [place], [facility] [phone]

## 2022-05-23 ENCOUNTER — Encounter: Payer: Self-pay | Admitting: Nurse Practitioner

## 2022-05-23 ENCOUNTER — Encounter: Payer: Self-pay | Admitting: Gastroenterology

## 2022-05-23 LAB — COMPLETE METABOLIC PANEL WITH GFR
AG Ratio: 1.4 (calc) (ref 1.0–2.5)
ALT: 10 U/L (ref 6–29)
AST: 16 U/L (ref 10–35)
Albumin: 4.3 g/dL (ref 3.6–5.1)
Alkaline phosphatase (APISO): 68 U/L (ref 37–153)
BUN: 15 mg/dL (ref 7–25)
CO2: 31 mmol/L (ref 20–32)
Calcium: 9.9 mg/dL (ref 8.6–10.4)
Chloride: 103 mmol/L (ref 98–110)
Creat: 0.91 mg/dL (ref 0.50–1.05)
Globulin: 3.1 g/dL (calc) (ref 1.9–3.7)
Glucose, Bld: 84 mg/dL (ref 65–99)
Potassium: 4.1 mmol/L (ref 3.5–5.3)
Sodium: 141 mmol/L (ref 135–146)
Total Bilirubin: 0.3 mg/dL (ref 0.2–1.2)
Total Protein: 7.4 g/dL (ref 6.1–8.1)
eGFR: 69 mL/min/{1.73_m2} (ref >=60)

## 2022-05-23 LAB — URIC ACID: Uric Acid, Serum: 4.7 mg/dL (ref 2.5–7.0)

## 2022-05-23 LAB — HEMOGLOBIN A1C
Hgb A1c MFr Bld: 6.2 % of total Hgb — ABNORMAL HIGH (ref <5.7)
Mean Plasma Glucose: 131 mg/dL
eAG (mmol/L): 7.3 mmol/L

## 2022-05-23 LAB — LIPID PANEL
Cholesterol: 167 mg/dL (ref <200)
HDL: 65 mg/dL (ref >=50)
LDL Cholesterol (Calc): 85 mg/dL (calc)
Non-HDL Cholesterol (Calc): 102 mg/dL (calc) (ref <130)
Total CHOL/HDL Ratio: 2.6 (calc) (ref <5.0)
Triglycerides: 82 mg/dL (ref <150)

## 2022-06-06 ENCOUNTER — Encounter: Payer: Self-pay | Admitting: Orthopaedic Surgery

## 2022-06-06 ENCOUNTER — Ambulatory Visit (INDEPENDENT_AMBULATORY_CARE_PROVIDER_SITE_OTHER): Payer: Medicare HMO | Admitting: Orthopaedic Surgery

## 2022-06-06 DIAGNOSIS — M1711 Unilateral primary osteoarthritis, right knee: Secondary | ICD-10-CM | POA: Diagnosis not present

## 2022-06-06 MED ORDER — BUPIVACAINE HCL 0.5 % IJ SOLN
2.0000 mL | INTRAMUSCULAR | Status: AC | PRN
  Administered 2022-06-06: 2 mL via INTRA_ARTICULAR

## 2022-06-06 MED ORDER — METHYLPREDNISOLONE ACETATE 40 MG/ML IJ SUSP
40.0000 mg | INTRAMUSCULAR | Status: AC | PRN
  Administered 2022-06-06: 40 mg via INTRA_ARTICULAR

## 2022-06-06 MED ORDER — LIDOCAINE HCL 1 % IJ SOLN
2.0000 mL | INTRAMUSCULAR | Status: AC | PRN
  Administered 2022-06-06: 2 mL

## 2022-06-06 NOTE — Progress Notes (Signed)
Office Visit Note   Patient: [name]           Date of Birth: 10-06-1955           MRN: [id number] Visit Date: 06/06/2022              Requested by: Stephanie Seller, NP [address] [place],  [place] [address] PCP: Stephanie Seller, NP   Assessment & Plan: Visit Diagnoses:  1. Primary osteoarthritis of right knee     Plan: Impression is right knee osteoarthritis flare.  She does have significant DJD on x-rays.  Based on her options she would like to try cortisone injection.  We will fit her with an OA reaction brace.  Her hip replacement has done fine and she has been very pleased.  Follow-Up Instructions: No follow-ups on file.   Orders:  No orders of the defined types were placed in this encounter.  No orders of the defined types were placed in this encounter.     Procedures: Large Joint Inj: R knee on 06/06/2022 1:38 PM Indications: pain Details: 22 G needle  Arthrogram: No  Medications: 40 mg methylPREDNISolone acetate 40 MG/ML; 2 mL lidocaine 1 %; 2 mL bupivacaine 0.5 % Consent was given by the patient. Patient was prepped and draped in the usual sterile fashion.       Clinical Data: No additional findings.   Subjective: Chief Complaint  Patient presents with   Right Hip - Pain   Right Knee - Pain    HPI [name] is here for right knee pain for several months.  She endorses diffuse pain with increased activities and standing.  Denies any hip or thigh or groin pain.  Review of Systems   Objective: Vital Signs: There were no vitals taken for this visit.  Physical Exam  Ortho Exam Examination of the right knee shows no joint effusion.  Slight joint line tenderness.  Collaterals and cruciates are stable.  Full range of motion. Specialty Comments:  No specialty comments available.  Imaging: No results found.   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    Primary osteoarthritis of right hip 03/24/2021   TRIGGER FINGER, LEFT THUMB 08/12/2009   Anxiety state 05/20/2009   CANDIDIASIS, SKIN 03/12/2009   TINGLING 12/24/2008   CHEST PAIN 02/03/2008   HYPOKALEMIA 11/07/2007   Other specified disorders of bladder 09/26/2007   HEADACHE 08/07/2007   CONSTIPATION 07/23/2007   MICROSCOPIC HEMATURIA 07/23/2007   DENTAL CARIES 05/27/2007   TINEA PEDIS 05/23/2007   Allergic rhinitis 05/23/2007   GERD 05/23/2007   HIATAL HERNIA 05/23/2007   DEGENERATIVE JOINT DISEASE, LEFT KNEE 05/23/2007   ESSENTIAL HYPERTENSION, BENIGN 05/20/2007   Past Medical History:  Diagnosis Date   Acid reflux    [facility]   Alcohol use    [facility]- pt states she only has a Novosad of wine occasionally   Arthritis    Knee and hip   BMI 36.0-36.9,adult    Per Records received from [facility]   Chronic knee pain    L & R   GERD (gastroesophageal reflux disease)    Heart murmur, systolic 09/19/2021   1/6 systolic   Hyperlipidemia    Hypertension    Morbid obesity ([facility])    Per Records received from [facility]   Other osteoarthritis involving  multiple joints    Per Records received from [facility]   Prediabetes    Per Records received from [facility]   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   Vitamin D deficiency    Per Records received from [facility]    Family History  Problem Relation Age of Onset   Hypertension Mother        [facility] [place]   Cancer Mother 62       colon   Colon cancer Mother        [facility] [place]   Cancer Father 72       lung   Hypertension Father        [facility]   Lung disease Father        Per records from [employer] [facility]   Seizures Brother    Epilepsy Brother    Drug abuse Daughter    Other Daughter        Murder in 2014    Past Surgical History:  Procedure Laterality Date   JOINT REPLACEMENT Left 07/11/2018   knee replacement   TOE SURGERY Right    bone spur "shaved" down- many years ago   TOTAL HIP ARTHROPLASTY Right 09/21/2021   Procedure: RIGHT TOTAL HIP ARTHROPLASTY ANTERIOR APPROACH;  Surgeon: Tarry Kos, MD;  [facility];  Service: Orthopedics;  Laterality: Right;   TUBAL LIGATION  1978   [facility]   Social History   Occupational History   Not on file  Tobacco Use   Smoking status: Never   Smokeless tobacco: Never  Vaping Use   Vaping Use: Never used  Substance and Sexual Activity   Alcohol use: Yes    Comment: pt states she may have 1 Mateo of wine on special occasions   Drug use: No   Sexual activity: Not on file

## 2022-06-12 ENCOUNTER — Other Ambulatory Visit: Payer: Self-pay

## 2022-06-12 MED ORDER — VITAMIN D3 50 MCG (2000 UT) PO CAPS: 2000.0000 [IU] | ORAL_CAPSULE | Freq: Every day | ORAL | 11 refills | Status: AC

## 2022-06-12 MED ORDER — PANTOPRAZOLE SODIUM 20 MG PO TBEC: 20.0000 mg | DELAYED_RELEASE_TABLET | Freq: Every day | ORAL | 1 refills | Status: DC

## 2022-06-12 MED ORDER — CALCIUM CARBONATE 600 MG PO TABS: 600.0000 mg | ORAL_TABLET | Freq: Two times a day (BID) | ORAL | 11 refills | Status: AC

## 2022-06-12 MED ORDER — ROSUVASTATIN CALCIUM 20 MG PO TABS: 20.0000 mg | ORAL_TABLET | Freq: Every day | ORAL | 1 refills | Status: DC

## 2022-06-12 MED ORDER — LISINOPRIL-HYDROCHLOROTHIAZIDE 20-12.5 MG PO TABS: 1.0000 | ORAL_TABLET | Freq: Every day | ORAL | 1 refills | Status: DC

## 2022-06-12 NOTE — Telephone Encounter (Signed)
Patient states she changed insurance companies and will no longer use [facility] as her pharmacy and needs all rx's sent to [facility]   Patient also questioned how much calcium and vit d she should take, I referred to last Bone Density test results and advised.

## 2022-06-13 ENCOUNTER — Telehealth: Payer: Self-pay | Admitting: *Deleted

## 2022-06-13 NOTE — Telephone Encounter (Signed)
Patient wants to know if it is ok to get the Gummy Vitamin D instead of tablets.   Please Advise.

## 2022-06-13 NOTE — Telephone Encounter (Signed)
LMOM for patient to return call.

## 2022-06-13 NOTE — Telephone Encounter (Signed)
Yes this should be fine if it is the same dosage, will add extra sugar due to being a gummy

## 2022-06-14 NOTE — Telephone Encounter (Signed)
LMOM to return call.

## 2022-06-15 NOTE — Telephone Encounter (Signed)
Patient notified and agreed and found the gummies with Zero sugar.

## 2022-06-26 ENCOUNTER — Other Ambulatory Visit: Payer: Self-pay

## 2022-06-26 ENCOUNTER — Ambulatory Visit (AMBULATORY_SURGERY_CENTER): Payer: Self-pay | Admitting: *Deleted

## 2022-06-26 VITALS — Ht 59.0 in | Wt 155.0 lb

## 2022-06-26 DIAGNOSIS — Z1211 Encounter for screening for malignant neoplasm of colon: Secondary | ICD-10-CM

## 2022-06-26 MED ORDER — NA SULFATE-K SULFATE-MG SULF 17.5-3.13-1.6 GM/177ML PO SOLN: 1.0000 | Freq: Once | ORAL | 0 refills | Status: AC

## 2022-06-26 NOTE — Progress Notes (Signed)
  Pre visit completed over telephone.  Instructions mailed.  No egg or soy allergy known to patient  No issues known to pt with past sedation with any surgeries or procedures Patient denies ever being told they had issues or difficulty with intubation  No FH of Malignant Hyperthermia Pt is not on diet pills Pt is not on  home 02  Pt is not on blood thinners  Pt denies issues with constipation  No A fib or A flutter  Pt instructed to use Single[employer].com or GoodRx for a price reduction on prep   

## 2022-07-07 ENCOUNTER — Telehealth: Payer: Self-pay

## 2022-07-07 NOTE — Telephone Encounter (Signed)
Per answering service pt wants to cancel the upcoming appt for colon.  Appt cancelled and pt made aware

## 2022-07-14 ENCOUNTER — Encounter (HOSPITAL_COMMUNITY): Payer: Self-pay

## 2022-07-14 ENCOUNTER — Ambulatory Visit (HOSPITAL_COMMUNITY)
Admission: EM | Admit: 2022-07-14 | Discharge: 2022-07-14 | Disposition: A | Discharge status: Home / Self Care | Payer: Medicare HMO | Attending: Family Medicine | Admitting: Family Medicine

## 2022-07-14 DIAGNOSIS — X503XXA Overexertion from repetitive movements, initial encounter: Secondary | ICD-10-CM | POA: Diagnosis not present

## 2022-07-14 DIAGNOSIS — M25532 Pain in left wrist: Secondary | ICD-10-CM | POA: Diagnosis not present

## 2022-07-14 MED ORDER — PREDNISONE 20 MG PO TABS: 40.0000 mg | ORAL_TABLET | Freq: Every day | ORAL | 0 refills | Status: AC

## 2022-07-14 NOTE — ED Provider Notes (Signed)
[place]    CSN: [id number] Arrival date & time: 07/14/22  1844      History   Chief Complaint Chief Complaint  Patient presents with   Wrist Pain    HPI [name] is a 67 y.o. female.   Patient presents urgent care for evaluation of left wrist pain and swelling that started abruptly yesterday while she was at work.  No known injury or trauma to the wrist recently.  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  Wrist pain and swelling like this has never happened in the past.  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  She has had corrective surgical procedure for carpal tunnel syndrome to both wrists many years ago.  Pain is worse with flexion at the wrist joint.  She cannot identify any relieving factors for wrist pain.  She took some Tylenol/ibuprofen at work last night and this seemed to help a little bit with the wrist pain but the swelling persists.  Denies warmth and redness to the left wrist.  History of gout to the great toe, never experienced gout attack to other areas of the body.  Also reports history of arthritis to the knee and the hip.  Denies previous injury of the left wrist in the past.  She is a prediabetic but does not currently take any medications for this.  Denies recent falls and fever/chills.     Past Medical History:  Diagnosis Date   Acid reflux    Per records from [place]   Alcohol use    Per Records received from [facility]- pt states she only has a [place] of wine occasionally   Arthritis    Knee and hip   BMI 36.0-36.9,adult    Per Records received from [facility]   Chronic knee pain    L & R   GERD (gastroesophageal reflux disease)    Heart murmur, systolic 99/83/3825   1/6 systolic   Hyperlipidemia    Hypertension    Morbid obesity ([place])    Per Records received from [facility]    Other osteoarthritis involving multiple joints    Per Records received from [facility]   Prediabetes    Per Records received from [facility]   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[facility])    Per records from [place]   Right hip pain    Per records from [place]   Right thigh pain 2022   Per records from [place]   Vitamin D deficiency    Per Records received from [facility]    Patient Active Problem List   Diagnosis Date Noted   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   Primary osteoarthritis of right hip 03/24/2021   TRIGGER FINGER, LEFT THUMB 08/12/2009   Anxiety state 05/20/2009   CANDIDIASIS, SKIN 03/12/2009   TINGLING 12/24/2008   CHEST PAIN 02/03/2008   HYPOKALEMIA 11/07/2007   Other specified disorders of bladder 09/26/2007   HEADACHE 08/07/2007   CONSTIPATION 07/23/2007   MICROSCOPIC HEMATURIA 07/23/2007   DENTAL CARIES 05/27/2007   TINEA PEDIS 05/23/2007   Allergic rhinitis 05/23/2007   GERD 05/23/2007   HIATAL HERNIA 05/23/2007   DEGENERATIVE JOINT DISEASE, LEFT KNEE 05/23/2007   ESSENTIAL HYPERTENSION, BENIGN 05/20/2007    Past  Surgical History:  Procedure Laterality Date   COLONOSCOPY     JOINT REPLACEMENT Left 07/11/2018   knee replacement   TOE SURGERY Right    bone spur "shaved" down- many years ago   TOTAL HIP ARTHROPLASTY Right 09/21/2021   Procedure: RIGHT TOTAL HIP ARTHROPLASTY ANTERIOR APPROACH;  Surgeon: Tarry Kos, MD;  Location: [facility] OR;  Service: Orthopedics;  Laterality: Right;   TUBAL LIGATION  1978   [facility]    [place] History   No obstetric history on file.      Home Medications    Prior to Admission medications   Medication Sig Start Date End Date Taking? Authorizing Provider  predniSONE (DELTASONE) 20 MG tablet Take 2 tablets (40 mg total) by mouth daily for 5 days. 07/14/22 07/19/22 Yes Carlisle Beers, FNP  calcium carbonate (CALCIUM 600) 600 MG TABS tablet Take 1 tablet (600 mg total) by mouth 2 (two) times daily with a meal. 06/12/22   [name], NP  Cholecalciferol (VITAMIN D3) 50 MCG (2000 UT) capsule Take 1 capsule (2,000 Units total) by mouth daily. 06/12/22   [name], NP  lisinopril-hydrochlorothiazide (ZESTORETIC) 20-12.5 MG tablet Take 1 tablet by mouth daily. 06/12/22   [name], NP  Multiple Vitamin (MULTIVITAMIN WITH MINERALS) TABS tablet Take 1 tablet by mouth daily.    [provider]  pantoprazole (PROTONIX) 20 MG tablet Take 1 tablet (20 mg total) by mouth daily. 06/12/22   [name], NP  rosuvastatin (CRESTOR) 20 MG tablet Take 1 tablet (20 mg total) by mouth daily. 06/12/22   [name], NP    Family History Family History  Problem Relation Age of Onset   Hypertension Mother        [facility]   Cancer Mother 79       colon   Colon cancer Mother        [facility]   Cancer Father 72       lung   Hypertension Father        [facility]   Lung disease Father        Per records from [facility]   Seizures Brother    Epilepsy Brother    Drug abuse Daughter    Other Daughter        Murder in 2014   Stomach cancer Neg Hx    Rectal cancer Neg Hx    Esophageal cancer Neg Hx     Social History Social History   Tobacco Use   Smoking status: Never   Smokeless tobacco: Never  Vaping Use   Vaping Use: Never used  Substance Use Topics   Alcohol use: Yes    Comment: pt states she may have 1 [place] of wine on special occasions   Drug use: No     Allergies   Patient has no known allergies.   Review of Systems Review of Systems Per HPI  Physical Exam Triage Vital Signs ED Triage Vitals [07/14/22 2003]  Enc Vitals Group     BP (!) 168/78      Pulse Rate 82     Resp 18     Temp 97.8 F (36.6 C)     Temp Source Oral     SpO2 100 %  Weight      Height      Head Circumference      Peak Flow      Pain Score      Pain Loc      Pain Edu?      Excl. in GC?    No data found.  Updated Vital Signs BP (!) 168/78 (BP Location: Left Arm)   Pulse 82   Temp 97.8 F (36.6 C) (Oral)   Resp 18   SpO2 100%   Visual Acuity Right Eye Distance:   Left Eye Distance:   Bilateral Distance:    Right Eye Near:   Left Eye Near:    Bilateral Near:     Physical Exam Vitals and nursing note reviewed.  Constitutional:      Appearance: She is not ill-appearing or toxic-appearing.  HENT:     Head: Normocephalic and atraumatic.     Right Ear: Hearing and external ear normal.     Left Ear: Hearing and external ear normal.     Nose: Nose normal.     Mouth/Throat:     Lips: Pink.  Eyes:     General: Lids are normal. Vision grossly intact. Gaze aligned appropriately.     Extraocular Movements: Extraocular movements intact.     Conjunctiva/sclera: Conjunctivae normal.  Pulmonary:     Effort: Pulmonary effort is normal.  Musculoskeletal:     Right wrist: Normal.     Left wrist: Swelling and tenderness present. No deformity, lacerations, bony tenderness, snuff box tenderness or crepitus. Normal range of motion. Normal pulse.     Cervical back: Neck supple.     Comments: +2 left radial pulse, less than 3 capillary refill to the left distal extremity.  No warmth, redness, or ecchymosis to the left wrist.  Soft tissue to the dorsal aspect of the left wrist is swollen without evidence of injury or deformity.  Patient has full range of motion of the left wrist with mild tenderness.  Mildly tender to palpation of the generalized left wrist without focal point tenderness.  5/5 grip strength of bilateral upper extremities, sensation intact distal to swelling.  Skin:    General: Skin is warm and dry.     Capillary Refill: Capillary refill  takes less than 2 seconds.     Findings: No rash.  Neurological:     General: No focal deficit present.     Mental Status: She is alert and oriented to person, place, and time. Mental status is at baseline.     Cranial Nerves: No dysarthria or facial asymmetry.  Psychiatric:        Mood and Affect: Mood normal.        Speech: Speech normal.        Behavior: Behavior normal.        Thought Content: Thought content normal.        Judgment: Judgment normal.      UC Treatments / Results  Labs (all labs ordered are listed, but only abnormal results are displayed) Labs Reviewed - No data to display  EKG   Radiology No results found.  Procedures Procedures (including critical care time)  Medications Ordered in UC Medications - No data to display  Initial Impression / Assessment and Plan / UC Course  I have reviewed the triage vital signs and the nursing notes.  Pertinent labs & imaging results that were available during my care of the patient were reviewed by me and considered in my  medical decision making (see chart for details).   1.  Left wrist pain Presentation is consistent with tendinopathy and swelling/inflammation secondary to overuse due to occupation.  Low suspicion for infectious versus gout etiology at this time.  We will manage this with prednisone burst 40 mg once daily for the next 5 days with food.  No NSAIDs while taking prednisone, patient voices agreement with this. Wrist brace placed in clinic, patient to wear this until her orthopedic follow-up appointment.  Walking referral to [facility] orthopedics provided. Work note provided. RICE advised. She is agreeable with plan.  Discussed physical exam and available lab work findings in clinic with patient.  Counseled patient regarding appropriate use of medications and potential side effects for all medications recommended or prescribed today. Discussed red flag signs and symptoms of worsening condition,when to  call the PCP office, return to urgent care, and when to seek higher level of care in the emergency department. Patient verbalizes understanding and agreement with plan. All questions answered. Patient discharged in stable condition.    Final Clinical Impressions(s) / UC Diagnoses   Final diagnoses:  Left wrist pain     Discharge Instructions      Take 40 mg of prednisone once daily for the next 5 days with food.  Do not take any ibuprofen when taking prednisone.  Wear the wrist brace I provided for you in the clinic all the time until your orthopedic follow-up appointment.  I gave you a work note to go back in a couple of days so that you are able to rest, ice, elevate, and compress your wrist.  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   Call [facility] to schedule an appointment for follow-up.   If you develop any new or worsening symptoms or do not improve in the next 2 to 3 days, please return.  If your symptoms are severe, please go to the emergency room.  Follow-up with your primary care provider for further evaluation and management of your symptoms as well as ongoing wellness visits.  I hope you feel better!     ED Prescriptions     Medication Sig Dispense Auth. Provider   predniSONE (DELTASONE) 20 MG tablet Take 2 tablets (40 mg total) by mouth daily for 5 days. 10 tablet Talbot Grumbling, FNP      PDMP not reviewed this encounter.   Talbot Grumbling, Aneta 07/15/22 2034

## 2022-07-14 NOTE — ED Triage Notes (Signed)
Pt reports left wrist pain. Denies any trauma or injuries to her wrist.

## 2022-07-14 NOTE — Discharge Instructions (Signed)
Take 40 mg of prednisone once daily for the next 5 days with food.  Do not take any ibuprofen when taking prednisone.  Wear the wrist brace I provided for you in the clinic all the time until your orthopedic follow-up appointment.  I gave you a work note to go back in a couple of days so that you are able to rest, ice, elevate, and compress your wrist.  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   Call [facility] to schedule an appointment for follow-up.   If you develop any new or worsening symptoms or do not improve in the next 2 to 3 days, please return.  If your symptoms are severe, please go to the emergency room.  Follow-up with your primary care provider for further evaluation and management of your symptoms as well as ongoing wellness visits.  I hope you feel better!

## 2022-07-26 ENCOUNTER — Encounter: Payer: Medicare HMO | Admitting: Gastroenterology

## 2022-09-12 ENCOUNTER — Other Ambulatory Visit: Payer: Self-pay | Admitting: Physician Assistant

## 2022-09-12 ENCOUNTER — Telehealth: Payer: Self-pay | Admitting: Orthopaedic Surgery

## 2022-09-12 MED ORDER — TRAMADOL HCL 50 MG PO TABS: 50.0000 mg | ORAL_TABLET | Freq: Two times a day (BID) | ORAL | 0 refills | Status: DC | PRN

## 2022-09-12 NOTE — Telephone Encounter (Signed)
sent 

## 2022-09-12 NOTE — Telephone Encounter (Signed)
Patient requesting pain medication. Please advise

## 2022-09-19 ENCOUNTER — Telehealth: Payer: Self-pay | Admitting: Orthopaedic Surgery

## 2022-09-19 NOTE — Telephone Encounter (Signed)
Patient requesting someone to call she is having some discomfort and want to speak to Dr. Erlinda Hong technician.

## 2022-10-12 ENCOUNTER — Emergency Department (HOSPITAL_COMMUNITY)
Admission: EM | Admit: 2022-10-12 | Discharge: 2022-10-12 | Disposition: A | Discharge status: Home / Self Care | Payer: Medicare HMO | Attending: Emergency Medicine | Admitting: Emergency Medicine

## 2022-10-12 ENCOUNTER — Emergency Department (HOSPITAL_COMMUNITY): Payer: Medicare HMO

## 2022-10-12 ENCOUNTER — Encounter (HOSPITAL_COMMUNITY): Payer: Self-pay | Admitting: Emergency Medicine

## 2022-10-12 DIAGNOSIS — I10 Essential (primary) hypertension: Secondary | ICD-10-CM | POA: Insufficient documentation

## 2022-10-12 DIAGNOSIS — Z79899 Other long term (current) drug therapy: Secondary | ICD-10-CM | POA: Insufficient documentation

## 2022-10-12 DIAGNOSIS — R109 Unspecified abdominal pain: Secondary | ICD-10-CM | POA: Insufficient documentation

## 2022-10-12 LAB — BASIC METABOLIC PANEL
Anion gap: 5 (ref 5–15)
BUN: 14 mg/dL (ref 8–23)
CO2: 27 mmol/L (ref 22–32)
Calcium: 9.2 mg/dL (ref 8.9–10.3)
Chloride: 104 mmol/L (ref 98–111)
Creatinine, Ser: 0.61 mg/dL (ref 0.44–1.00)
GFR, Estimated: >60 mL/min (ref >60)
Glucose, Bld: 95 mg/dL (ref 70–99)
Potassium: 3.5 mmol/L (ref 3.5–5.1)
Sodium: 136 mmol/L (ref 135–145)

## 2022-10-12 LAB — CBC WITH DIFFERENTIAL/PLATELET
Abs Immature Granulocytes: 0.02 10*3/uL (ref 0.00–0.07)
Basophils Absolute: 0.1 10*3/uL (ref 0.0–0.1)
Basophils Relative: 1 %
Eosinophils Absolute: 0.3 10*3/uL (ref 0.0–0.5)
Eosinophils Relative: 5 %
HCT: 37.4 % (ref 36.0–46.0)
Hemoglobin: 12.6 g/dL (ref 12.0–15.0)
Immature Granulocytes: 0 %
Lymphocytes Relative: 46 %
Lymphs Abs: 3.4 10*3/uL (ref 0.7–4.0)
MCH: 31.1 pg (ref 26.0–34.0)
MCHC: 33.7 g/dL (ref 30.0–36.0)
MCV: 92.3 fL (ref 80.0–100.0)
Monocytes Absolute: 0.5 10*3/uL (ref 0.1–1.0)
Monocytes Relative: 8 %
Neutro Abs: 2.8 10*3/uL (ref 1.7–7.7)
Neutrophils Relative %: 40 %
Platelets: 222 10*3/uL (ref 150–400)
RBC: 4.05 MIL/uL (ref 3.87–5.11)
RDW: 12 % (ref 11.5–15.5)
WBC: 7.2 10*3/uL (ref 4.0–10.5)
nRBC: 0 % (ref 0.0–0.2)

## 2022-10-12 LAB — URINALYSIS, ROUTINE W REFLEX MICROSCOPIC
Bilirubin Urine: NEGATIVE
Glucose, UA: NEGATIVE mg/dL
Ketones, ur: NEGATIVE mg/dL
Nitrite: NEGATIVE
Protein, ur: NEGATIVE mg/dL
Specific Gravity, Urine: 1.016 (ref 1.005–1.030)
pH: 6 (ref 5.0–8.0)

## 2022-10-12 MED ORDER — KETOROLAC TROMETHAMINE 15 MG/ML IJ SOLN
15.0000 mg | Freq: Once | INTRAMUSCULAR | Status: AC
  Administered 2022-10-12: 15 mg via INTRAVENOUS
  Filled 2022-10-12: qty 1

## 2022-10-12 MED ORDER — OXYCODONE-ACETAMINOPHEN 5-325 MG PO TABS
1.0000 | ORAL_TABLET | ORAL | Status: DC | PRN
  Administered 2022-10-12: 1 via ORAL
  Filled 2022-10-12: qty 1

## 2022-10-12 MED ORDER — LIDOCAINE 5 % EX PTCH: 1.0000 | MEDICATED_PATCH | CUTANEOUS | 0 refills | Status: DC

## 2022-10-12 NOTE — Discharge Instructions (Signed)
You had a CT scan performed in the emergency department today.  The CT scan did not show a clear source for your pain but it did show some findings that need to be followed up by your family doctor.  You have gallstones in your gallbladder and you also have atherosclerosis, which you need to speak to your family doctor about.

## 2022-10-12 NOTE — ED Triage Notes (Signed)
PT having pain on right flank/lower back that started tonight. She put an OTC patch on it. 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 Pain does not radiate and she reports it feels swollen back there. Denies recent falls.

## 2022-10-12 NOTE — ED Provider Notes (Signed)
[facility] Provider Note   CSN: [id number] Arrival date & time: 10/12/22  0044     History  Chief Complaint  Patient presents with   Back Pain    [name] is a 67 y.o. female.  The history is provided by the patient.  Back Pain [name] is a 67 y.o. female who presents to the Emergency Department complaining of flank pain.  She presents to the emergency department for evaluation of 3 days of sharp, left-sided flank pain.  Pain waxes and wanes with no clear alleviating or worsening factors.  No reported injuries.  No radiation.  No fevers, nausea, vomiting, abdominal pain, chest pain, dysuria, change in urine quality.  She has a history of hypertension, no additional medical problems.  No prior similar symptoms.     Home Medications Prior to Admission medications   Medication Sig Start Date End Date Taking? Authorizing Provider  lidocaine (LIDODERM) 5 % Place 1 patch onto the skin daily. Remove & Discard patch within 12 hours or as directed by MD 10/12/22  Yes Quintella Reichert, MD  traMADol (ULTRAM) 50 MG tablet Take 1 tablet (50 mg total) by mouth 2 (two) times daily as needed. 09/12/22   Aundra Dubin, PA-C  calcium carbonate (CALCIUM 600) 600 MG TABS tablet Take 1 tablet (600 mg total) by mouth 2 (two) times daily with a meal. 06/12/22   Lauree Chandler, NP  Cholecalciferol (VITAMIN D3) 50 MCG (2000 UT) capsule Take 1 capsule (2,000 Units total) by mouth daily. 06/12/22   Lauree Chandler, NP  lisinopril-hydrochlorothiazide (ZESTORETIC) 20-12.5 MG tablet Take 1 tablet by mouth daily. 06/12/22   Lauree Chandler, NP  Multiple Vitamin (MULTIVITAMIN WITH MINERALS) TABS tablet Take 1 tablet by mouth daily.    [provider]  pantoprazole (PROTONIX) 20 MG tablet Take 1 tablet (20 mg total) by mouth daily. 06/12/22   Lauree Chandler, NP  rosuvastatin (CRESTOR) 20 MG tablet Take 1 tablet (20 mg total) by mouth daily.  06/12/22   Lauree Chandler, NP      Allergies    Patient has no known allergies.    Review of Systems   Review of Systems  Musculoskeletal:  Positive for back pain.  All other systems reviewed and are negative.   Physical Exam Updated Vital Signs BP (!) 142/87   Pulse 69   Temp (!) 97.5 F (36.4 C)   Resp 16   SpO2 99%  Physical Exam Vitals and nursing note reviewed.  Constitutional:      Appearance: She is well-developed.  HENT:     Head: Normocephalic and atraumatic.  Cardiovascular:     Rate and Rhythm: Normal rate and regular rhythm.  Pulmonary:     Effort: Pulmonary effort is normal. No respiratory distress.  Abdominal:     Palpations: Abdomen is soft.     Tenderness: There is no abdominal tenderness. There is left CVA tenderness. There is no guarding or rebound.  Musculoskeletal:        General: No tenderness.     Comments: 2+ DP pulses bilaterally.  Skin:    General: Skin is warm and dry.  Neurological:     Mental Status: She is alert and oriented to person, place, and time.     Comments: 5 out of 5 strength in bilateral lower extremities with sensation to light touch intact in bilateral lower extremities  Psychiatric:  Behavior: Behavior normal.     ED Results / Procedures / Treatments   Labs (all labs ordered are listed, but only abnormal results are displayed) Labs Reviewed  URINALYSIS, ROUTINE W REFLEX MICROSCOPIC - Abnormal; Notable for the following components:      Result Value   Hgb urine dipstick MODERATE (*)    Leukocytes,Ua SMALL (*)    Bacteria, UA FEW (*)    All other components within normal limits  BASIC METABOLIC PANEL  CBC WITH DIFFERENTIAL/PLATELET    EKG None  Radiology CT Renal Stone Study  Result Date: 10/12/2022 CLINICAL DATA:  Left-sided flank and back pain for the past 3 days EXAM: CT ABDOMEN AND PELVIS WITHOUT CONTRAST TECHNIQUE: Multidetector CT imaging of the abdomen and pelvis was performed following the  standard protocol without IV contrast. RADIATION DOSE REDUCTION: This exam was performed according to the departmental dose-optimization program which includes automated exposure control, adjustment of the mA and/or kV according to patient size and/or use of iterative reconstruction technique. COMPARISON:  Prior CT abdomen/pelvis 10/16/2007 FINDINGS: Lower chest: No acute abnormality. Hepatobiliary: Ill-defined 1.2 cm low-attenuation lesion in hepatic segment 4 B. 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 This very likely represents the same structure. Large calcified gallstone present within the gallbladder neck. No biliary ductal dilatation. Pancreas: Unremarkable. No pancreatic ductal dilatation or surrounding inflammatory changes. Spleen: Normal in size without focal abnormality. Adrenals/Urinary Tract: Adrenal glands are unremarkable. Kidneys are normal, without renal calculi, focal lesion, or hydronephrosis. Bladder is unremarkable. Stomach/Bowel: Stomach is within normal limits. Appendix appears normal. No evidence of bowel wall thickening, distention, or inflammatory changes. Vascular/Lymphatic: Limited evaluation in the absence of intravenous contrast. Mild atherosclerotic calcifications. No evidence of aneurysm. No suspicious lymphadenopathy. Reproductive: Uterus and bilateral adnexa are unremarkable. Other: No abdominal wall hernia or abnormality. No abdominopelvic ascites. Musculoskeletal: No acute fracture or aggressive appearing lytic or blastic osseous lesion. IMPRESSION: No evidence of nephro or ureterolithiasis. Trace aortic atherosclerotic vascular calcifications. Aortic Atherosclerosis (ICD10-I70.0). Cholelithiasis. Electronically Signed   By: Jacqulynn Cadet M.D.   On: 10/12/2022 06:07    Procedures Procedures    Medications Ordered in ED Medications  oxyCODONE-acetaminophen (PERCOCET/ROXICET) 5-325 MG per tablet 1 tablet  (1 tablet Oral Given 10/12/22 0105)  ketorolac (TORADOL) 15 MG/ML injection 15 mg (15 mg Intravenous Given 10/12/22 0533)    ED Course/ Medical Decision Making/ A&P                             Medical Decision Making Amount and/or Complexity of Data Reviewed Labs: ordered. Radiology: ordered.  Risk Prescription drug management.   Patient here for evaluation of left flank pain.  She does have some left-sided CVA tenderness, no focal neurologic deficits.  Current clinical picture is not consistent with dissection.  UA does have hemoglobin present.  UA is otherwise not consistent with UTI.  CT stone study was obtained, which is negative for acute obstructing stone.  CBC and BMP are unremarkable.  Discussed with patient incidental findings of CT scan of cholelithiasis as well as atherosclerosis.  Discussed home care for flank pain with OTC analgesics such as acetaminophen and ibuprofen.  Will prescribe Lidoderm patches as needed.  Discussed PCP follow-up and return precautions.        Final Clinical Impression(s) / ED Diagnoses Final diagnoses:  Acute left flank pain    Rx / DC Orders ED Discharge Orders  Ordered    lidocaine (LIDODERM) 5 %  Every 24 hours        10/12/22 0726              Quintella Reichert, MD 10/12/22 [phone]

## 2022-10-17 ENCOUNTER — Encounter: Payer: Self-pay | Admitting: Family

## 2022-10-17 ENCOUNTER — Ambulatory Visit (INDEPENDENT_AMBULATORY_CARE_PROVIDER_SITE_OTHER): Payer: Medicare HMO | Admitting: Family

## 2022-10-17 VITALS — BP 158/88 | HR 77 | Temp 97.4°F | Ht 59.0 in | Wt 151.4 lb

## 2022-10-17 DIAGNOSIS — M62830 Muscle spasm of back: Secondary | ICD-10-CM

## 2022-10-17 DIAGNOSIS — N3001 Acute cystitis with hematuria: Secondary | ICD-10-CM | POA: Diagnosis not present

## 2022-10-17 DIAGNOSIS — R109 Unspecified abdominal pain: Secondary | ICD-10-CM | POA: Diagnosis not present

## 2022-10-17 LAB — POCT URINALYSIS DIPSTICK
Bilirubin, UA: NEGATIVE
Glucose, UA: NEGATIVE
Ketones, UA: NEGATIVE
Nitrite, UA: POSITIVE
Protein, UA: NEGATIVE — AB
Spec Grav, UA: 1.01 (ref 1.010–1.025)
Urobilinogen, UA: 0.2 E.U./dL
pH, UA: 8 — AB (ref 5.0–8.0)

## 2022-10-17 MED ORDER — METHOCARBAMOL 500 MG PO TABS: 500.0000 mg | ORAL_TABLET | Freq: Three times a day (TID) | ORAL | 0 refills | Status: DC | PRN

## 2022-10-17 MED ORDER — CIPROFLOXACIN HCL 500 MG PO TABS: 500.0000 mg | ORAL_TABLET | Freq: Two times a day (BID) | ORAL | 0 refills | Status: AC

## 2022-10-17 NOTE — Patient Instructions (Signed)
-   Increase water intake to 6-8 glasses of water daily  - Notify provider if symptoms worsen or fail to improve

## 2022-10-17 NOTE — Progress Notes (Signed)
Provider: Kea Callan FNP-C  Sharon SellerEubanks, Jessica K, NP  Patient Care Team: Sharon SellerEubanks, Jessica K, NP as PCP - General (Geriatric Medicine)  Extended Emergency Contact Information Primary Emergency Contact: Cullars,[name] Address: [address]          [address]          [place], [place] [id number] [place] Home Phone: [phone] Mobile Phone: [phone] Relation: Son Secondary Emergency Contact: CARTER,GILDA Mobile Phone: [phone] Relation: Sister  Code Status:  Full Code  Goals of care: Advanced Directive information    05/22/2022    1:47 PM  Advanced Directives  Does Patient Have a Medical Advance Directive? No  Would patient like information on creating a medical advance directive? No - Patient declined     Chief Complaint  Patient presents with   Acute Visit    Patient     HPI:  Pt is a 67 y.o. female seen today for an acute visit for evaluation of left flank pain.she was seen in the ER 10/12/2022 for acute left flank pain.CT scan done which showed cholelithiasis and atherosclerosis. Urine specimen showed small leukocytes ,few bacteria but negative for blood and leukocytes. She was treated with percocet and discharged on topical patch.States Has used topical patch and tylenol without any relief. States had some discomfort with urination today but did not pay no attention.  She denies any fever,chills,nausea,vomiting,abdominal pain,urgency,frequency,difficult urination or hematuria.  Past Medical History:  Diagnosis Date   Acid reflux    [facility]   Alcohol use    [facility]- pt states she only has a Degrazia of wine occasionally   Arthritis    Knee and hip   BMI 36.0-36.9,adult    [facility]   Chronic knee pain    L & R   GERD (gastroesophageal reflux disease)    Heart murmur, systolic 09/19/2021   1/6 systolic   Hyperlipidemia    Hypertension     Morbid obesity    [facility]   Other osteoarthritis involving multiple joints    [facility]   Prediabetes    [facility]   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[facility]   Right hip pain    [facility]   Right thigh pain 2022   [facility]   Vitamin D deficiency    [facility]   Past Surgical History:  Procedure Laterality Date   COLONOSCOPY     JOINT REPLACEMENT Left 07/11/2018   knee replacement   TOE SURGERY Right    bone spur "shaved" down- many years ago   TOTAL HIP ARTHROPLASTY Right 09/21/2021   Procedure: RIGHT TOTAL HIP ARTHROPLASTY ANTERIOR APPROACH;  Surgeon: Tarry KosXu, Naiping M, MD;  Location: [facility] OR;  Service: Orthopedics;  Laterality: Right;   TUBAL LIGATION  1978   [facility]    No Known Allergies  Outpatient Encounter Medications as of 10/17/2022  Medication Sig   calcium carbonate (CALCIUM 600) 600 MG TABS tablet Take 1 tablet (600 mg total) by mouth 2 (two) times daily with a meal.   Cholecalciferol (VITAMIN D3) 50 MCG (2000 UT) capsule Take 1 capsule (2,000 Units total) by  mouth daily.   lidocaine (LIDODERM) 5 % Place 1 patch onto the skin daily. Remove & Discard patch within 12 hours or as directed by MD   lisinopril-hydrochlorothiazide (ZESTORETIC) 20-12.5 MG tablet Take 1 tablet by mouth daily.   Multiple Vitamin (MULTIVITAMIN WITH MINERALS) TABS tablet Take 1 tablet by mouth daily.   pantoprazole (PROTONIX) 20 MG tablet Take 1 tablet (20 mg total) by mouth daily.   rosuvastatin (CRESTOR) 20 MG tablet Take 1 tablet (20 mg total) by mouth daily.   traMADol (ULTRAM) 50 MG tablet Take 1 tablet (50 mg total) by mouth 2 (two) times daily as needed.   No facility-administered encounter medications on file  as of 10/17/2022.    Review of Systems  Constitutional:  Negative for appetite change, chills, fatigue, fever and unexpected weight change.  HENT:  Negative for congestion, dental problem, ear discharge, ear pain, facial swelling, hearing loss, nosebleeds, postnasal drip, rhinorrhea, sinus pressure, sinus pain, sneezing, sore throat, tinnitus and trouble swallowing.   Eyes:  Negative for pain, discharge, redness, itching and visual disturbance.  Respiratory:  Negative for cough, chest tightness, shortness of breath and wheezing.   Cardiovascular:  Negative for chest pain, palpitations and leg swelling.  Gastrointestinal:  Negative for abdominal distention, abdominal pain, blood in stool, constipation, diarrhea, nausea and vomiting.  Endocrine: Negative for cold intolerance, heat intolerance, polydipsia, polyphagia and polyuria.  Genitourinary:  Positive for dysuria and flank pain. Negative for difficulty urinating, frequency and urgency.  Musculoskeletal:  Positive for back pain. Negative for arthralgias, gait problem, joint swelling, myalgias, neck pain and neck stiffness.  Skin:  Negative for color change, pallor, rash and wound.  Neurological:  Negative for dizziness, syncope, speech difficulty, weakness, light-headedness, numbness and headaches.  Hematological:  Does not bruise/bleed easily.  Psychiatric/Behavioral:  Negative for agitation, behavioral problems, confusion, hallucinations, self-injury, sleep disturbance and suicidal ideas. The patient is not nervous/anxious.     Immunization History  Administered Date(s) Administered   Fluad Quad(high Dose 65+) 03/09/2021, 05/22/2022   Influenza Whole 04/19/2009   Influenza,inj,Quad PF,6+ Mos 05/17/2018   Moderna SARS-COV2 Booster Vaccination 01/14/2021   Moderna Sars-Covid-2 Vaccination 04/16/2020, 05/14/2020   PNEUMOCOCCAL CONJUGATE-20 04/25/2021   Pfizer Covid-19 Vaccine Bivalent Booster 8167yrs & up 05/28/2021   Td 08/10/2006    Pertinent  Health Maintenance Due  Topic Date Due   COLONOSCOPY (Pts [phone] Insurance coverage will need to be confirmed)  Never done   INFLUENZA VACCINE  02/08/2023   MAMMOGRAM  08/30/2023   DEXA SCAN  Completed      02/10/2022    1:55 AM 04/27/2022    8:48 AM 05/22/2022    1:47 PM 07/14/2022    8:03 PM 07/14/2022    8:04 PM  Fall Risk  Falls in the past year?  0 0    Was there an injury with Fall?  0 0    Fall Risk Category Calculator  0 0    Fall Risk Category (Retired)  Low Low    (RETIRED) Patient Fall Risk Level Low fall risk Low fall risk Low fall risk Low fall risk Low fall risk  Patient at Risk for Falls Due to  No Fall Risks No Fall Risks    Fall risk Follow up  Falls evaluation completed Falls evaluation completed     Functional Status Survey:    Vitals:   10/17/22 1308  BP: (!) 158/88  Pulse: 77  Temp: (!) 97.4 F (36.3 C)  SpO2: 99%  Weight: 151  lb 6.4 oz (68.7 kg)  Height: 4\' 11"  (1.499 m)   Body mass index is 30.58 kg/m. Physical Exam Vitals reviewed.  Constitutional:      General: She is not in acute distress.    Appearance: Normal appearance. She is obese. She is not ill-appearing or diaphoretic.  HENT:     Head: Normocephalic.     Mouth/Throat:     Mouth: Mucous membranes are moist.     Pharynx: Oropharynx is clear. No oropharyngeal exudate or posterior oropharyngeal erythema.  Eyes:     General: No scleral icterus.       Right eye: No discharge.        Left eye: No discharge.     Conjunctiva/sclera: Conjunctivae normal.     Pupils: Pupils are equal, round, and reactive to light.  Neck:     Vascular: No carotid bruit.  Cardiovascular:     Rate and Rhythm: Normal rate and regular rhythm.     Pulses: Normal pulses.     Heart sounds: Normal heart sounds. No murmur heard.    No friction rub. No gallop.  Pulmonary:     Effort: Pulmonary effort is normal. No respiratory distress.     Breath sounds: Normal breath sounds. No wheezing, rhonchi  or rales.  Chest:     Chest wall: No tenderness.  Abdominal:     General: Bowel sounds are normal. There is no distension.     Palpations: Abdomen is soft. There is no mass.     Tenderness: There is abdominal tenderness in the left lower quadrant. There is no right CVA tenderness, left CVA tenderness, guarding or rebound.  Musculoskeletal:        General: No swelling or tenderness. Normal range of motion.     Cervical back: Normal range of motion. No rigidity or tenderness.     Right lower leg: No edema.     Left lower leg: No edema.  Lymphadenopathy:     Cervical: No cervical adenopathy.  Skin:    General: Skin is warm and dry.     Coloration: Skin is not pale.     Findings: No bruising, erythema, lesion or rash.  Neurological:     Mental Status: She is alert and oriented to person, place, and time.     Cranial Nerves: No cranial nerve deficit.     Motor: No weakness.     Gait: Gait normal.  Psychiatric:        Mood and Affect: Mood normal.        Speech: Speech normal.        Behavior: Behavior normal.     Labs reviewed: Recent Labs    02/10/22 0204 05/22/22 1424 10/12/22 0558  NA 139 141 136  K 3.8 4.1 3.5  CL 105 103 104  CO2 26 31 27   GLUCOSE 95 84 95  BUN 13 15 14   CREATININE 0.73 0.91 0.61  CALCIUM 9.7 9.9 9.2   Recent Labs    11/18/21 1441 05/22/22 1424  AST 13 16  ALT 9 10  BILITOT 0.5 0.3  PROT 7.0 7.4   Recent Labs    11/18/21 1441 02/10/22 0204 10/12/22 0558  WBC 6.9 6.7 7.2  NEUTROABS 3,450  --  2.8  HGB 11.7 12.6 12.6  HCT 36.6 38.6 37.4  MCV 90.8 90.8 92.3  PLT 220 254 222   Lab Results  Component Value Date   TSH 0.65 01/14/2021   Lab Results  Component Value Date  HGBA1C 6.2 (H) 05/22/2022   Lab Results  Component Value Date   CHOL 167 05/22/2022   HDL 65 05/22/2022   LDLCALC 85 05/22/2022   TRIG 82 05/22/2022   CHOLHDL 2.6 05/22/2022    Significant Diagnostic Results in last 30 days:  CT Renal Stone Study  Result  Date: 10/12/2022 CLINICAL DATA:  Left-sided flank and back pain for the past 3 days EXAM: CT ABDOMEN AND PELVIS WITHOUT CONTRAST TECHNIQUE: Multidetector CT imaging of the abdomen and pelvis was performed following the standard protocol without IV contrast. RADIATION DOSE REDUCTION: This exam was performed according to the departmental dose-optimization program which includes automated exposure control, adjustment of the mA and/or kV according to patient size and/or use of iterative reconstruction technique. COMPARISON:  Prior CT abdomen/pelvis 10/16/2007 FINDINGS: Lower chest: No acute abnormality. Hepatobiliary: Ill-defined 1.2 cm low-attenuation lesion in hepatic segment 4 B. 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 This very likely represents the same structure. Large calcified gallstone present within the gallbladder neck. No biliary ductal dilatation. Pancreas: Unremarkable. No pancreatic ductal dilatation or surrounding inflammatory changes. Spleen: Normal in size without focal abnormality. Adrenals/Urinary Tract: Adrenal glands are unremarkable. Kidneys are normal, without renal calculi, focal lesion, or hydronephrosis. Bladder is unremarkable. Stomach/Bowel: Stomach is within normal limits. Appendix appears normal. No evidence of bowel wall thickening, distention, or inflammatory changes. Vascular/Lymphatic: Limited evaluation in the absence of intravenous contrast. Mild atherosclerotic calcifications. No evidence of aneurysm. No suspicious lymphadenopathy. Reproductive: Uterus and bilateral adnexa are unremarkable. Other: No abdominal wall hernia or abnormality. No abdominopelvic ascites. Musculoskeletal: No acute fracture or aggressive appearing lytic or blastic osseous lesion. IMPRESSION: No evidence of nephro or ureterolithiasis. Trace aortic atherosclerotic vascular calcifications. Aortic Atherosclerosis (ICD10-I70.0). Cholelithiasis.  Electronically Signed   By: Malachy Moan M.D.   On: 10/12/2022 06:07    Assessment/Plan  1. Flank pain Afebrile Status post ED visit on 10/12/2022 for acute left flank pain.CT scan done which showed cholelithiasis and atherosclerosis. Urine specimen showed small leukocytes ,few bacteria but negative for blood and leukocytes. She was treated with percocet and discharged on topical patch. - POCT urinalysis dipstick indicates dark yellow cloudy urine positive for moderate blood, nitrites and large leukocytes 3+ -Encouraged to increase water intake -Will send urine for culture -Start on broad-spectrum antibiotics while awaiting for cultures.  Aware might need to change antibiotics if sensitivity does not indicate Cipro. - methocarbamol (ROBAXIN) 500 MG tablet; Take 1 tablet (500 mg total) by mouth every 8 (eight) hours as needed for muscle spasms.  Dispense: 30 tablet; Refill: 0 - Urine Culture  2. Muscle spasm of back Suspect low back pain due to urinary tract infection though cannot rule out musculoskeletal.  Start on Robaxin as below. - methocarbamol (ROBAXIN) 500 MG tablet; Take 1 tablet (500 mg total) by mouth every 8 (eight) hours as needed for muscle spasms.  Dispense: 30 tablet; Refill: 0  3. Hematuria due to acute cystitis Afebrile -Urine dipstick as above.  Started on Cipro side effects discussed. -Advised to notify provider or go to ED if symptoms worsen or fail to improve - ciprofloxacin (CIPRO) 500 MG tablet; Take 1 tablet (500 mg total) by mouth 2 (two) times daily for 7 days.  Dispense: 14 tablet; Refill: 0 - Urine Culture  Family/ staff Communication: Reviewed plan of care with patient verbalized understanding  Labs/tests ordered:  - Urine Culture  Next Appointment: Return if symptoms worsen or fail to improve.  Sandrea Hughs, NP

## 2022-10-19 ENCOUNTER — Telehealth: Payer: Self-pay

## 2022-10-19 LAB — URINE CULTURE
MICRO NUMBER:: 14802535
SPECIMEN QUALITY:: ADEQUATE

## 2022-10-19 NOTE — Telephone Encounter (Signed)
PA request received and started through covermymeds for Methocarbamol. Waiting on reply

## 2022-10-22 ENCOUNTER — Emergency Department (HOSPITAL_COMMUNITY): Payer: Medicare HMO

## 2022-10-22 ENCOUNTER — Emergency Department (HOSPITAL_COMMUNITY)
Admission: EM | Admit: 2022-10-22 | Discharge: 2022-10-22 | Disposition: A | Discharge status: Home / Self Care | Payer: Medicare HMO | Attending: Emergency Medicine | Admitting: Emergency Medicine

## 2022-10-22 ENCOUNTER — Encounter (HOSPITAL_COMMUNITY): Payer: Self-pay | Admitting: Emergency Medicine

## 2022-10-22 DIAGNOSIS — R109 Unspecified abdominal pain: Secondary | ICD-10-CM

## 2022-10-22 DIAGNOSIS — K8020 Calculus of gallbladder without cholecystitis without obstruction: Secondary | ICD-10-CM | POA: Insufficient documentation

## 2022-10-22 LAB — CBC WITH DIFFERENTIAL/PLATELET
Abs Immature Granulocytes: 0.01 10*3/uL (ref 0.00–0.07)
Basophils Absolute: 0 10*3/uL (ref 0.0–0.1)
Basophils Relative: 0 %
Eosinophils Absolute: 0.2 10*3/uL (ref 0.0–0.5)
Eosinophils Relative: 3 %
HCT: 39.1 % (ref 36.0–46.0)
Hemoglobin: 12.7 g/dL (ref 12.0–15.0)
Immature Granulocytes: 0 %
Lymphocytes Relative: 43 %
Lymphs Abs: 3.1 10*3/uL (ref 0.7–4.0)
MCH: 30.6 pg (ref 26.0–34.0)
MCHC: 32.5 g/dL (ref 30.0–36.0)
MCV: 94.2 fL (ref 80.0–100.0)
Monocytes Absolute: 0.3 10*3/uL (ref 0.1–1.0)
Monocytes Relative: 4 %
Neutro Abs: 3.5 10*3/uL (ref 1.7–7.7)
Neutrophils Relative %: 50 %
Platelets: 263 10*3/uL (ref 150–400)
RBC: 4.15 MIL/uL (ref 3.87–5.11)
RDW: 12 % (ref 11.5–15.5)
WBC: 7.2 10*3/uL (ref 4.0–10.5)
nRBC: 0 % (ref 0.0–0.2)

## 2022-10-22 LAB — URINALYSIS, ROUTINE W REFLEX MICROSCOPIC
Bacteria, UA: NONE SEEN
Bilirubin Urine: NEGATIVE
Glucose, UA: NEGATIVE mg/dL
Ketones, ur: NEGATIVE mg/dL
Leukocytes,Ua: NEGATIVE
Nitrite: NEGATIVE
Protein, ur: NEGATIVE mg/dL
Specific Gravity, Urine: 1.004 — ABNORMAL LOW (ref 1.005–1.030)
pH: 5 (ref 5.0–8.0)

## 2022-10-22 LAB — COMPREHENSIVE METABOLIC PANEL
ALT: 17 U/L (ref 0–44)
AST: 27 U/L (ref 15–41)
Albumin: 3.8 g/dL (ref 3.5–5.0)
Alkaline Phosphatase: 57 U/L (ref 38–126)
Anion gap: 9 (ref 5–15)
BUN: 13 mg/dL (ref 8–23)
CO2: 27 mmol/L (ref 22–32)
Calcium: 9.7 mg/dL (ref 8.9–10.3)
Chloride: 102 mmol/L (ref 98–111)
Creatinine, Ser: 0.81 mg/dL (ref 0.44–1.00)
GFR, Estimated: >60 mL/min (ref >60)
Glucose, Bld: 116 mg/dL — ABNORMAL HIGH (ref 70–99)
Potassium: 3.5 mmol/L (ref 3.5–5.1)
Sodium: 138 mmol/L (ref 135–145)
Total Bilirubin: 0.9 mg/dL (ref 0.3–1.2)
Total Protein: 7.1 g/dL (ref 6.5–8.1)

## 2022-10-22 LAB — LIPASE, BLOOD: Lipase: 27 U/L (ref 11–51)

## 2022-10-22 MED ORDER — IOHEXOL 350 MG/ML SOLN
75.0000 mL | Freq: Once | INTRAVENOUS | Status: AC | PRN
  Administered 2022-10-22: 75 mL via INTRAVENOUS

## 2022-10-22 MED ORDER — OXYCODONE HCL 5 MG PO TABS: 5.0000 mg | ORAL_TABLET | Freq: Four times a day (QID) | ORAL | 0 refills | Status: DC | PRN

## 2022-10-22 MED ORDER — HYDROMORPHONE HCL 1 MG/ML IJ SOLN
1.0000 mg | Freq: Once | INTRAMUSCULAR | Status: AC
  Administered 2022-10-22: 1 mg via INTRAVENOUS
  Filled 2022-10-22: qty 1

## 2022-10-22 NOTE — ED Triage Notes (Signed)
Pt here for continued side pain that has not resolved. States she had to leave work again due to pain. States started antibiotics but didn't finish them

## 2022-10-22 NOTE — ED Provider Notes (Signed)
[facility] Provider Note   CSN: [id number] Arrival date & time: 10/22/22  0543     History  Chief Complaint  Patient presents with   Abdominal Pain    [name] is a 67 y.o. female presenting to ED with left abdominal pain.  Reports onset about 2 weeks ago, seen in ED on 10/11/22 with unremarkable CT renal stone scan, aside from incidental 1.2 cm low attenuating lesion in the hepatic segment and a large calcified gallstone present within the gallbladder neck.  She reports she has had persistent and constant pain ever since her visit to the ED.  It seems to be worse with walking.  Her pain is mostly localized around her left flank.  She denies nausea, vomiting, diarrhea, hematuria or dysuria.  She reports that her PCP diagnosed her with a "urine infection" and started her on antibiotic which she is currently taking.  She reports that ibuprofen and muscle relaxers are not helping with her pain.  Her pain is interfering with her ability to work, where she works at [employer].  HPI     Home Medications Prior to Admission medications   Medication Sig Start Date End Date Taking? Authorizing Provider  oxyCODONE (ROXICODONE) 5 MG immediate release tablet Take 1 tablet (5 mg total) by mouth every 6 (six) hours as needed for up to 10 doses for severe pain. 10/22/22  Yes Terald Sleeper, MD  calcium carbonate (CALCIUM 600) 600 MG TABS tablet Take 1 tablet (600 mg total) by mouth 2 (two) times daily with a meal. 06/12/22   [name], NP  Cholecalciferol (VITAMIN D3) 50 MCG (2000 UT) capsule Take 1 capsule (2,000 Units total) by mouth daily. 06/12/22   [name], NP  ciprofloxacin (CIPRO) 500 MG tablet Take 1 tablet (500 mg total) by mouth 2 (two) times daily for 7 days. 10/17/22 10/24/22  Ngetich, Dinah C, NP  lidocaine (LIDODERM) 5 % Place 1 patch onto the skin daily. Remove & Discard patch within 12 hours or as directed by MD 10/12/22   Tilden Fossa, MD  lisinopril-hydrochlorothiazide (ZESTORETIC) 20-12.5 MG tablet Take 1 tablet by mouth daily. 06/12/22   [name], NP  methocarbamol (ROBAXIN) 500 MG tablet Take 1 tablet (500 mg total) by mouth every 8 (eight) hours as needed for muscle spasms. 10/17/22   Ngetich, Dinah C, NP  Multiple Vitamin (MULTIVITAMIN WITH MINERALS) TABS tablet Take 1 tablet by mouth daily.    [provider]  pantoprazole (PROTONIX) 20 MG tablet Take 1 tablet (20 mg total) by mouth daily. 06/12/22   [name], NP  rosuvastatin (CRESTOR) 20 MG tablet Take 1 tablet (20 mg total) by mouth daily. 06/12/22   [name], NP  traMADol (ULTRAM) 50 MG tablet Take 1 tablet (50 mg total) by mouth 2 (two) times daily as needed. 09/12/22   Cristie Hem, PA-C      Allergies    Patient has no known allergies.    Review of Systems   Review of Systems  Physical Exam Updated Vital Signs BP 132/62 (BP Location: Right Arm)   Pulse (!) 59   Temp 97.9 F (36.6 C) (Oral)   Resp 16   SpO2 100%  Physical Exam Constitutional:      General: She is not in acute distress. HENT:     Head: Normocephalic and atraumatic.  Eyes:     Conjunctiva/sclera: Conjunctivae normal.  Pupils: Pupils are equal, round, and reactive to light.  Cardiovascular:     Rate and Rhythm: Normal rate and regular rhythm.  Pulmonary:     Effort: Pulmonary effort is normal. No respiratory distress.  Abdominal:     General: There is no distension.     Tenderness: There is abdominal tenderness in the left upper quadrant and left lower quadrant. There is left CVA tenderness. There is no right CVA tenderness or guarding.  Skin:    General: Skin is warm and dry.  Neurological:     General: No focal deficit present.     Mental Status: She is alert. Mental status is at baseline.  Psychiatric:        Mood and Affect: Mood normal.        Behavior: Behavior normal.     ED Results / Procedures / Treatments    Labs (all labs ordered are listed, but only abnormal results are displayed) Labs Reviewed  COMPREHENSIVE METABOLIC PANEL - Abnormal; Notable for the following components:      Result Value   Glucose, Bld 116 (*)    All other components within normal limits  URINALYSIS, ROUTINE W REFLEX MICROSCOPIC - Abnormal; Notable for the following components:   Color, Urine STRAW (*)    Specific Gravity, Urine 1.004 (*)    Hgb urine dipstick SMALL (*)    All other components within normal limits  LIPASE, BLOOD  CBC WITH DIFFERENTIAL/PLATELET    EKG None  Radiology CT ABDOMEN PELVIS W CONTRAST  Result Date: 10/22/2022 CLINICAL DATA:  Acute left-sided abdominal pain. EXAM: CT ABDOMEN AND PELVIS WITH CONTRAST TECHNIQUE: Multidetector CT imaging of the abdomen and pelvis was performed using the standard protocol following bolus administration of intravenous contrast. RADIATION DOSE REDUCTION: This exam was performed according to the departmental dose-optimization program which includes automated exposure control, adjustment of the mA and/or kV according to patient size and/or use of iterative reconstruction technique. CONTRAST:  75mL OMNIPAQUE IOHEXOL 350 MG/ML SOLN COMPARISON:  October 12, 2022.  October 16, 2007. FINDINGS: Lower chest: No acute abnormality. Hepatobiliary: Cholelithiasis. No biliary dilatation is noted. 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 Pancreas: Unremarkable. No pancreatic ductal dilatation or surrounding inflammatory changes. Spleen: Normal in size without focal abnormality. Adrenals/Urinary Tract: Adrenal glands are unremarkable. Kidneys are normal, without renal calculi, focal lesion, or hydronephrosis. Bladder is unremarkable. Stomach/Bowel: Stomach is within normal limits. Appendix appears normal. No evidence of bowel wall thickening, distention, or inflammatory changes. Vascular/Lymphatic: Aortic atherosclerosis. No enlarged abdominal or pelvic lymph nodes. Reproductive:  Uterus and bilateral adnexa are unremarkable. Other: No abdominal wall hernia or abnormality. No abdominopelvic ascites. Musculoskeletal: Status post right total hip arthroplasty. No acute osseous abnormality is noted. IMPRESSION: Cholelithiasis. No acute abnormality seen in the abdomen or pelvis. Aortic Atherosclerosis (ICD10-I70.0). Electronically Signed   By: Lupita Raider M.D.   On: 10/22/2022 08:33    Procedures Procedures    Medications Ordered in ED Medications  HYDROmorphone (DILAUDID) injection 1 mg (1 mg Intravenous Given 10/22/22 0739)  iohexol (OMNIPAQUE) 350 MG/ML injection 75 mL (75 mLs Intravenous Contrast Given 10/22/22 0815)    ED Course/ Medical Decision Making/ A&P Clinical Course as of 10/22/22 1812  Sun Oct 22, 2022  0927 Patient reassessed and has had significant relief of her pain with the Dilaudid.  I have not found any emergent findings on her CT imaging to warrant further hospitalization or emergent testing in the ER.  I recommended that she  follow-up with her PCP for this issue.  She has a ride coming to pick her up and take her safely home. [MT]    Clinical Course User Index [MT] Akita Maxim, Kermit Balo, MD                             Medical Decision Making Amount and/or Complexity of Data Reviewed Labs: ordered. Radiology: ordered.  Risk Prescription drug management.   This patient presents to the ED with concern for persistent left-sided flank and abdominal pain.. This involves an extensive number of treatment options, and is a complaint that carries with it a high risk of complications and morbidity.  The differential diagnosis includes colitis versus ureteral colic versus referred lower thoracic lumbar pain versus muscle spasms versus other  Patient has a history of tubal ligation is postmenopausal, it is less likely an issue of ovarian torsion or ruptured ovarian cyst, no concern for PID.  I ordered and personally interpreted labs.  The pertinent results  include:  no emergent findings  The patient had a CT renal stone study which I reviewed from her prior visit nearly 2 weeks ago, which did not show any clear source of left-sided abdominal pain, but noted a gallstone on the right side.  She is not having nausea or vomiting or Murphy sign at this time to suggest that this is an acute biliary issue.  Her liver enzymes and lipase are not elevated.  I do think it is reasonable to repeat a CT scan now, this time with contrast for more detailed look perhaps her bowels for potential colitis, or ureteral stone that was not visible initially, given that there is hemoglobin in her blood.   I have much lower suspicion for mesenteric ischemia.  Her vitals are unremarkable and she has no leukocytosis or GI symptoms with 2 weeks of symptoms.  I ordered imaging studies including CT abdomen pelvis with contrast I independently visualized and interpreted imaging which showed no emergent findings or clear explanation for patient's symptoms I agree with the radiologist interpretation   I ordered medication including IV Dilaudid for pain  I have reviewed the patients home medicines and have made adjustments as needed   After the interventions noted above, I reevaluated the patient and found that they have: improved   Dispostion:  After consideration of the diagnostic results and the patients response to treatment, I feel that the patent would benefit from outpatient PCP f/u.         Final Clinical Impression(s) / ED Diagnoses Final diagnoses:  Left flank pain  Gallstones    Rx / DC Orders ED Discharge Orders          Ordered    oxyCODONE (ROXICODONE) 5 MG immediate release tablet  Every 6 hours PRN        10/22/22 0928              Terald Sleeper, MD 10/22/22 1812

## 2022-10-22 NOTE — ED Notes (Signed)
IV removed by this tech, patient tolerated well

## 2022-10-22 NOTE — Discharge Instructions (Addendum)
You were given IV narcotics in the ER today.  Please do not drive a car today or operate heavy machinery or perform any dangerous activity.  The medication should wear off within the next 4 to 5 hours after leaving the emergency department.  You are also prescribed a short course of narcotic medicine called oxycodone which is an opioid pain pill.  This is for breakthrough pain only.  Do not mix this medication with alcohol or any other sedating medicines, including muscle relaxers like Robaxin.  Do not drive or perform any dangerous activities after taking oxycodone.

## 2022-10-23 ENCOUNTER — Telehealth: Payer: Self-pay

## 2022-10-23 NOTE — Telephone Encounter (Signed)
Continue on tramadol and prescribed oxycodone from ED.Also complete Cipro antibiotics for Urinary tract infection which might also relief the side pain.

## 2022-10-23 NOTE — Telephone Encounter (Signed)
Patient called and left message on clinical intake voicemail stating that she Is still having pain on the left side and had to go back to the ED. The medication she was given when she cam in the office is not helping. Please advise  Message routed to Richarda Blade, NP

## 2022-10-24 NOTE — Telephone Encounter (Signed)
Spoke with patient and she verbalized her understanding. 

## 2022-10-31 ENCOUNTER — Telehealth: Payer: Self-pay

## 2022-10-31 DIAGNOSIS — K8020 Calculus of gallbladder without cholecystitis without obstruction: Secondary | ICD-10-CM

## 2022-10-31 NOTE — Telephone Encounter (Signed)
Patient was given referral to gastroenterology in October,but never called back to schedule appointment. Patient was given the name and number of [facility] for her to call and see if they will still be able to see her.

## 2022-10-31 NOTE — Telephone Encounter (Signed)
noted 

## 2022-10-31 NOTE — Telephone Encounter (Signed)
Patient called and she is still having left side flank pain. She says that she has finished the antibiotic. She is also taking the tramadol and oxycodone with no relief. She doesn't want to continue taking a lot of medication if it's not helping her.  Message sent to Richarda Blade, NP

## 2022-10-31 NOTE — Telephone Encounter (Signed)
Recommend follow up with Gastroenterology for further evaluation of cholelithiasis noted on CT scan of the Abdomen on 10/22/2022.If pain worsen recommend urgent care or ED for evaluation.

## 2022-11-01 NOTE — Telephone Encounter (Signed)
Patient called and says that she called [facility] and tried to make an appointment,but they don't have anything available until June. Could there be an urgent referral placed to [facility]

## 2022-11-01 NOTE — Telephone Encounter (Signed)
Referral to [facility] ordered.

## 2022-11-02 ENCOUNTER — Ambulatory Visit
Admission: RE | Admit: 2022-11-02 | Discharge: 2022-11-02 | Disposition: A | Discharge status: Home / Self Care | Payer: Medicare HMO | Source: Ambulatory Visit | Attending: Orthopedic Surgery | Admitting: Orthopedic Surgery

## 2022-11-02 ENCOUNTER — Encounter: Payer: Self-pay | Admitting: Orthopedic Surgery

## 2022-11-02 ENCOUNTER — Ambulatory Visit (INDEPENDENT_AMBULATORY_CARE_PROVIDER_SITE_OTHER): Payer: Medicare HMO | Admitting: Orthopedic Surgery

## 2022-11-02 ENCOUNTER — Other Ambulatory Visit: Payer: Self-pay | Admitting: Orthopedic Surgery

## 2022-11-02 VITALS — BP 140/78 | HR 87 | Temp 97.2°F | Resp 16 | Ht 59.0 in | Wt 156.2 lb

## 2022-11-02 DIAGNOSIS — K8020 Calculus of gallbladder without cholecystitis without obstruction: Secondary | ICD-10-CM | POA: Diagnosis not present

## 2022-11-02 DIAGNOSIS — M545 Low back pain, unspecified: Secondary | ICD-10-CM

## 2022-11-02 MED ORDER — PREDNISONE 10 MG PO TABS: ORAL_TABLET | ORAL | 0 refills | Status: AC

## 2022-11-02 NOTE — Progress Notes (Signed)
Careteam: Patient Care Team: [name], NP as PCP - General (Geriatric Medicine)  Seen by: Hazle Nordmann, AGNP-C  PLACE OF SERVICE:  [facility]  Advanced Directive information Does Patient Have a Medical Advance Directive?: No, Would patient like information on creating a medical advance directive?: No - Patient declined  No Known Allergies  Chief Complaint  Patient presents with   Acute Visit    Patient wants FMLA papers filled out.     HPI: Patient is a 67 y.o. female seen today for ongoing LLQ pain.   Ongoing LLQ pain extending to lower back x 1 month. She has been evaluated by ED twice this month. CT abdomen noted cholelithiasis. CT renal study did not show clear source of left sided abdominal pain. Overall asymptomatic except for pain. Liver enzymes and lipase not elevated. She has been given oxycodone prn for pain. Denies pain relief with medication. She is scheduled to see GI specialist in 1 month.    Today she reports increased low back pain to left lower side. Pain increased by movement of sitting on soft surfaces.No radiation of pain. She works for [employer]. She reports a lot of bending during her job. Afebrile. Vitals stable. No recent injury fall or injury. H/o RATH 09/2021.    Review of Systems:  Review of Systems  Constitutional:  Negative for chills and fever.  HENT: Negative.    Respiratory:  Negative for cough, shortness of breath and wheezing.   Cardiovascular:  Negative for chest pain and leg swelling.  Gastrointestinal:  Negative for abdominal pain, blood in stool, constipation, diarrhea, heartburn, nausea and vomiting.  Genitourinary:  Negative for dysuria, flank pain, frequency and hematuria.  Musculoskeletal:  Positive for back pain and joint pain. Negative for falls.  Skin: Negative.   Neurological:  Positive for weakness. Negative for dizziness and headaches.  Psychiatric/Behavioral:  Negative for depression. The patient  is not nervous/anxious.     Past Medical History:  Diagnosis Date   Acid reflux    [facility]   Alcohol use    Per Records received from [facility]- pt states she only has a [place] of wine occasionally   Arthritis    Knee and hip   BMI 36.0-36.9,adult    Per Records received from [facility]   Chronic knee pain    L & R   GERD (gastroesophageal reflux disease)    Heart murmur, systolic 09/19/2021   1/6 systolic   Hyperlipidemia    Hypertension    Morbid obesity    Per Records received from [facility]   Other osteoarthritis involving multiple joints    Per Records received from [employer]   Prediabetes    Per Records received from [facility]   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Per records from [employer] [facility]   Right hip pain    Per records from [facility] [facility]   Right thigh pain 2022   Per records from [employer] [facility]   Vitamin D deficiency    Per Records received from [facility]   Past Surgical History:  Procedure Laterality Date   COLONOSCOPY     JOINT REPLACEMENT Left 07/11/2018   knee replacement   TOE SURGERY Right    bone spur "shaved" down- many years ago   TOTAL HIP ARTHROPLASTY Right 09/21/2021   Procedure: RIGHT TOTAL HIP ARTHROPLASTY ANTERIOR  APPROACH;  Surgeon: Tarry Kos, MD;  Location: [facility] OR;  Service: Orthopedics;  Laterality: Right;   TUBAL LIGATION  1978   Per records from [facility]   Social History:   reports that she has never smoked. She has never used smokeless tobacco. She reports current alcohol use. She reports that she does not use drugs.  Family History  Problem Relation Age of Onset   Hypertension Mother        [facility]   Cancer Mother 108       colon   Colon cancer Mother        Per records from [facility] [place]   Cancer Father 72        lung   Hypertension Father        [facility]   Lung disease Father        Per records from [employer] [facility]   Seizures Brother    Epilepsy Brother    Drug abuse Daughter    Other Daughter        Murder in 2014   Stomach cancer Neg Hx    Rectal cancer Neg Hx    Esophageal cancer Neg Hx     Medications: Patient's Medications  New Prescriptions   No medications on file  Previous Medications   CALCIUM CARBONATE (CALCIUM 600) 600 MG TABS TABLET    Take 1 tablet (600 mg total) by mouth 2 (two) times daily with a meal.   CHOLECALCIFEROL (VITAMIN D3) 50 MCG (2000 UT) CAPSULE    Take 1 capsule (2,000 Units total) by mouth daily.   LIDOCAINE (LIDODERM) 5 %    Place 1 patch onto the skin daily. Remove & Discard patch within 12 hours or as directed by MD   LISINOPRIL-HYDROCHLOROTHIAZIDE (ZESTORETIC) 20-12.5 MG TABLET    Take 1 tablet by mouth daily.   METHOCARBAMOL (ROBAXIN) 500 MG TABLET    Take 1 tablet (500 mg total) by mouth every 8 (eight) hours as needed for muscle spasms.   MULTIPLE VITAMIN (MULTIVITAMIN WITH MINERALS) TABS TABLET    Take 1 tablet by mouth daily.   OXYCODONE (ROXICODONE) 5 MG IMMEDIATE RELEASE TABLET    Take 1 tablet (5 mg total) by mouth every 6 (six) hours as needed for up to 10 doses for severe pain.   PANTOPRAZOLE (PROTONIX) 20 MG TABLET    Take 1 tablet (20 mg total) by mouth daily.   ROSUVASTATIN (CRESTOR) 20 MG TABLET    Take 1 tablet (20 mg total) by mouth daily.  Modified Medications   No medications on file  Discontinued Medications   TRAMADOL (ULTRAM) 50 MG TABLET    Take 1 tablet (50 mg total) by mouth 2 (two) times daily as needed.    Physical Exam:  Vitals:   11/02/22 0848  BP: (!) 150/82  Pulse: 87  Resp: 16  Temp: (!) 97.2 F (36.2 C)  SpO2: 98%  Weight: 156 lb 3.2 oz (70.9 kg)  Height:  (1.499 m)   Body mass index is 31.55 kg/m. Wt Readings from Last 3  Encounters:  11/02/22 156 lb 3.2 oz (70.9 kg)  10/17/22 151 lb 6.4 oz (68.7 kg)  06/26/22 155 lb (70.3 kg)    Physical Exam Vitals reviewed.  Constitutional:      General: She is not in acute distress.  HENT:     Head: Normocephalic.  Eyes:     General:        Right eye: No discharge.        Left eye: No discharge.  Cardiovascular:     Rate and Rhythm: Normal rate and regular rhythm.     Pulses: Normal pulses.     Heart sounds: Normal heart sounds.  Pulmonary:     Effort: Pulmonary effort is normal.     Breath sounds: Normal breath sounds.  Abdominal:     General: Bowel sounds are normal. There is no distension.     Palpations: Abdomen is soft. There is no mass.     Tenderness: There is no abdominal tenderness. There is no right CVA tenderness, left CVA tenderness, guarding or rebound.     Hernia: No hernia is present.  Musculoskeletal:     Cervical back: Normal and neck supple.     Thoracic back: Normal.     Lumbar back: Tenderness present. No swelling or deformity. Decreased range of motion. No scoliosis.     Right lower leg: No edema.     Left lower leg: No edema.  Skin:    General: Skin is warm and dry.     Capillary Refill: Capillary refill takes less than 2 seconds.  Neurological:     General: No focal deficit present.     Mental Status: She is alert and oriented to person, place, and time.  Psychiatric:        Mood and Affect: Mood normal.        Behavior: Behavior normal.     Labs reviewed: Basic Metabolic Panel: Recent Labs    05/22/22 1424 10/12/22 0558 10/22/22 0602  NA 141 136 138  K 4.1 3.5 3.5  CL 103 104 102  CO2 31 27 27   GLUCOSE 84 95 116*  BUN 15 14 13   CREATININE 0.91 0.61 0.81  CALCIUM 9.9 9.2 9.7   Liver Function Tests: Recent Labs    11/18/21 1441 05/22/22 1424 10/22/22 0602  AST 13 16 27   ALT 9 10 17   ALKPHOS  --   --  57  BILITOT 0.5 0.3 0.9  PROT 7.0 7.4 7.1  ALBUMIN  --   --  3.8   Recent Labs    10/22/22 0602   LIPASE 27   No results for input(s): "AMMONIA" in the last 8760 hours. CBC: Recent Labs    11/18/21 1441 02/10/22 0204 10/12/22 0558 10/22/22 0602  WBC 6.9 6.7 7.2 7.2  NEUTROABS 3,450  --  2.8 3.5  HGB 11.7 12.6 12.6 12.7  HCT 36.6 38.6 37.4 39.1  MCV 90.8 90.8 92.3 94.2  PLT 220 254 222 263   Lipid Panel: Recent Labs    11/18/21 1441 05/22/22 1424  CHOL 176 167  HDL 57 65  LDLCALC 96 85  TRIG 131 82  CHOLHDL 3.1 2.6   TSH: No results for input(s): "TSH" in the last 8760 hours. A1C: Lab Results  Component Value Date   HGBA1C 6.2 (H) 05/22/2022     Assessment/Plan 1. Acute left-sided low back pain without sciatica - increased left lower back pain extending to LLQ - limited ROM, no groin pain, WBAT - h/o RATH 09/2021 with Dr. Roda Shutters - works for [employer]  - oxycodone ineffective at relieving pain - will try prednisone taper - encourage light walking - will consider outpatient PT or ortho referral if no improvement - DG Lumbar Spine Complete; Future -  predniSONE (DELTASONE) 10 MG tablet; Take 4 tablets (40 mg total) by mouth daily with breakfast for 2 days, THEN 3 tablets (30 mg total) daily with breakfast for 2 days, THEN 3 tablets (30 mg total) daily with breakfast for 2 days, THEN 2 tablets (20 mg total) daily with breakfast for 2 days, THEN 1 tablet (10 mg total) daily with breakfast for 2 days.  Dispense: 26 tablet; Refill: 0  2. Calculus of gallbladder without cholecystitis without obstruction - CT abdomen noted cholelithiasis, lab work unremarkable including liver enzymes and lipase - GI referral made   Total time: 22 minutes. Greater than 50% of total time spent doing patient education regarding ongoing LLQ pain including lower back pain.    Next appt: none Jahbari Repinski Scherry Ran  [facility] [phone]

## 2022-11-02 NOTE — Patient Instructions (Addendum)
Please have xray done at [facility] [address]  Light walking best for back  Please take prednisone in the morning so you can sleep at night  Sit on firm surfaces  Light walking is best for back  Continue [place] back patches daily to help with pain  May also try voltaren gel or biofreeze application to lower back  May also try Tylenol 1000 mg 2-3 times daily for pain  Please continue to schedule with GI to address gallstones

## 2022-11-03 ENCOUNTER — Telehealth: Payer: Self-pay

## 2022-11-03 NOTE — Telephone Encounter (Signed)
Please notify patient.

## 2022-11-03 NOTE — Telephone Encounter (Signed)
[facility] called and left message with Jaquita Rector Electronics engineer) stating that patient needs to see a surgeon not GI.  Message routed to Richarda Blade, NP

## 2022-11-06 ENCOUNTER — Telehealth: Payer: Self-pay

## 2022-11-06 NOTE — Telephone Encounter (Signed)
Patient dropped off short term disability paperwork. She states that she isn't feeling any better and would like to extend her time off owrk for about a week or so or long enough to find out what is wrong with her. Patient will need another note to turn into her employer.Paperwork started snd placed on desk of Hazle Nordmann, NP to complete and sign.  Message routed to Hazle Nordmann, NP

## 2022-11-07 ENCOUNTER — Telehealth: Payer: Self-pay

## 2022-11-07 NOTE — Telephone Encounter (Signed)
Patent scheduled appointment for 11/10/22 with you to have short term disability paperwork filled out. Paperwork placed in review and sign folder.

## 2022-11-07 NOTE — Telephone Encounter (Signed)
I spoke to patient about her X-rays and notified her of provider Hazle Nordmann, NP response. She has appointment scheduled with Abbey Chatters, NP 11/10/2022.

## 2022-11-08 NOTE — Telephone Encounter (Signed)
Noted thank you

## 2022-11-08 NOTE — Telephone Encounter (Addendum)
Patient called saying that she no longer needs paperwork completed and that she is going back to work. Patient has cancelled appointment for Friday. She requested that paperwork be shredded.  Message sent to Abbey Chatters, NP [facility])

## 2022-11-10 ENCOUNTER — Ambulatory Visit: Payer: Medicare HMO | Admitting: Nurse Practitioner

## 2022-11-22 ENCOUNTER — Ambulatory Visit: Payer: Medicare HMO | Admitting: Nurse Practitioner

## 2022-11-24 ENCOUNTER — Ambulatory Visit: Payer: Medicare HMO | Admitting: Nurse Practitioner

## 2022-12-10 ENCOUNTER — Other Ambulatory Visit: Payer: Self-pay | Admitting: Nurse Practitioner

## 2023-01-08 ENCOUNTER — Telehealth: Payer: Medicare HMO

## 2023-01-08 DIAGNOSIS — K8020 Calculus of gallbladder without cholecystitis without obstruction: Secondary | ICD-10-CM

## 2023-01-08 NOTE — Telephone Encounter (Signed)
Patient called to see if referral could be placed for a general surgeon per GI doctor request. She states that a referral was place in April,2024 to the gastroenterology and it was recommend from them for her to see a general surgeon. She reports not hearing anything about appointment or if a referral was placed.

## 2023-01-08 NOTE — Telephone Encounter (Signed)
Referral placed.

## 2023-01-08 NOTE — Telephone Encounter (Signed)
Patient was advised via voicemail that referral was placed and someone will give her a call.

## 2023-01-08 NOTE — Addendum Note (Signed)
Addended by: [name] on: 01/08/2023 12:10 PM   Modules accepted: Orders

## 2023-01-09 ENCOUNTER — Telehealth: Payer: Self-pay

## 2023-01-09 MED ORDER — ROSUVASTATIN CALCIUM 20 MG PO TABS: 20.0000 mg | ORAL_TABLET | Freq: Every day | ORAL | 1 refills | Status: DC

## 2023-01-09 NOTE — Telephone Encounter (Signed)
Pharmacy faxed a rx for rosuvastatin 20 MG and medication was send into pharmacy.

## 2023-01-24 ENCOUNTER — Telehealth (INDEPENDENT_AMBULATORY_CARE_PROVIDER_SITE_OTHER): Payer: Medicare HMO | Admitting: Adult Health

## 2023-01-24 ENCOUNTER — Encounter: Payer: Self-pay | Admitting: Adult Health

## 2023-01-24 DIAGNOSIS — M5136 Other intervertebral disc degeneration, lumbar region: Secondary | ICD-10-CM | POA: Diagnosis not present

## 2023-01-24 NOTE — Progress Notes (Signed)
DATE:  01/24/2023 MRN:  [id number]  BIRTHDAY: [birth year]   Contact Information   None on File    Other Contacts     Name Relation Home Work Mobile   [name] Son [phone]  [phone]   [name]   [phone]        Code Status History     Date Active Date Inactive Code Status Order ID Comments User Context   09/21/2021 1616 09/22/2021 1707 Full Code [id number]  Tarry Kos, MD Inpatient      Patient location:  at Home  Provider location:  [facility]  Patient was unable to self report due to lack of equipment at home via telehealth    Chief Complaint  Patient presents with   Acute Visit    Referral Request    HISTORY OF PRESENT ILLNESS: This is a 67 year old female who had a video visit today for referral request. She stated that she was having severe left lower back pain so she went to [facility] ED in [place], [place]. She was told that she has DDD and needs to see orthopedics. She has been having left lower back pain for 6 weeks and has been affecting her job. 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 She stated that her pain level is 8/10. She stated that nothing seems to work. She takes Robaxin PRN and Oxycodone PRN.   PAST MEDICAL HISTORY:  Past Medical History:  Diagnosis Date   Acid reflux    [facility]   Alcohol use    Per Records received from [facility]- pt states she only has a [place] of wine occasionally   Arthritis    Knee and hip   BMI 36.0-36.9,adult    Per Records received from [facility]   Chronic knee pain    L & R   GERD (gastroesophageal reflux disease)    Heart murmur, systolic 09/19/2021   1/6 systolic   Hyperlipidemia    Hypertension    Morbid obesity (HCC)    Per Records received from [facility]   Other osteoarthritis involving multiple joints    Per Records received from [employer]   Prediabetes    Per Records received from [facility]   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[facility])    Per records from [employer] [facility]   Right hip pain    Per records from [employer] [facility]   Right thigh pain 2022   Per records from [employer] [facility] the [place]   Vitamin D deficiency    Per Records received from [facility]     CURRENT MEDICATIONS: Reviewed  Patient's Medications  New Prescriptions   No medications on file  Previous Medications   CALCIUM CARBONATE (CALCIUM 600) 600 MG TABS TABLET    Take 1 tablet (600 mg total) by mouth 2 (two) times daily with a meal.   CHOLECALCIFEROL (VITAMIN D3) 50 MCG (2000 UT) CAPSULE    Take 1 capsule (2,000 Units total) by mouth daily.   LIDOCAINE (LIDODERM) 5 %    Place 1 patch onto the skin daily. Remove & Discard patch within 12 hours or as directed by MD   LISINOPRIL-HYDROCHLOROTHIAZIDE (ZESTORETIC) 20-12.5 MG TABLET    Take 1 tablet by mouth once daily   METHOCARBAMOL (ROBAXIN) 500 MG TABLET    Take 1 tablet (500 mg total) by mouth every 8 (eight)  hours as needed for muscle spasms.   MULTIPLE VITAMIN (MULTIVITAMIN WITH MINERALS) TABS TABLET    Take 1 tablet by mouth daily.   OXYCODONE (ROXICODONE) 5 MG IMMEDIATE RELEASE TABLET    Take 1 tablet (5 mg total) by mouth every 6 (six) hours as needed for up to 10 doses for severe pain.   PANTOPRAZOLE (PROTONIX) 20 MG TABLET    Take 1 tablet (20 mg total) by mouth daily.   ROSUVASTATIN (CRESTOR) 20 MG TABLET    Take 1 tablet (20 mg total) by mouth daily.  Modified Medications   No medications on file  Discontinued Medications   No medications on file     No Known Allergies   REVIEW OF SYSTEMS:  GENERAL: no change in appetite, no fatigue, no weight changes, no fever, chills or weakness SKIN: Denies rash, itching, wounds, ulcer sores, or nail abnormality EYES: Denies change in vision, dry eyes, eye pain, itching or discharge EARS: Denies change in hearing, ringing in ears, or earache NOSE: Denies  nasal congestion or epistaxis MOUTH and THROAT: Denies oral discomfort, gingival pain or bleeding, pain from teeth or hoarseness   RESPIRATORY: no cough, SOB, DOE, wheezing, hemoptysis CARDIAC: no chest pain, edema or palpitations GI: no abdominal pain, diarrhea, constipation, heart burn, nausea or vomiting GU: Denies dysuria, frequency, hematuria, incontinence, or discharge MUSCULOSKELETAL: has left lower back pain CIRCULATION: Denies claudication, edema of legs, varicosities, or cold extremities NEUROLOGICAL: Denies dizziness, syncope, numbness, or headache PSYCHIATRIC: Denies feeling of depression or anxiety. No report of hallucinations, insomnia, paranoia, or agitation    LABS/RADIOLOGY: Labs reviewed: Basic Metabolic Panel: Recent Labs    05/22/22 1424 10/12/22 0558 10/22/22 0602  NA 141 136 138  K 4.1 3.5 3.5  CL 103 104 102  CO2 31 27 27   GLUCOSE 84 95 116*  BUN 15 14 13   CREATININE 0.91 0.61 0.81  CALCIUM 9.9 9.2 9.7   Liver Function Tests: Recent Labs    05/22/22 1424 10/22/22 0602  AST 16 27  ALT 10 17  ALKPHOS  --  57  BILITOT 0.3 0.9  PROT 7.4 7.1  ALBUMIN  --  3.8   Recent Labs    10/22/22 0602  LIPASE 27   No results for input(s): "AMMONIA" in the last 8760 hours. CBC: Recent Labs    02/10/22 0204 10/12/22 0558 10/22/22 0602  WBC 6.7 7.2 7.2  NEUTROABS  --  2.8 3.5  HGB 12.6 12.6 12.7  HCT 38.6 37.4 39.1  MCV 90.8 92.3 94.2  PLT 254 222 263   A1C: Invalid input(s): "A1C" Lipid Panel: Recent Labs    05/22/22 1424  HDL 65   Cardiac Enzymes: No results for input(s): "CKTOTAL", "CKMB", "CKMBINDEX", "TROPONINI" in the last 8760 hours. BNP: Invalid input(s): "POCBNP" CBG: No results for input(s): "GLUCAP" in the last 8760 hours.    No results found.  ASSESSMENT/PLAN:  1. Degenerative disc disease, lumbar -  has 8/10 on her left lower back affecting her work -  continue Robaxin PRN and Oxycodone PRN -  recommended to wear  back brace when standing - Ambulatory referral to Orthopedics     Time spent on non face to face visit:   14 minutes  The patient gave consent to this video visit. Explained to the patient the risk and privacy issue that was involved with this video call.   The patient was advised to call back and ask for an in-person evaluation if the symptoms worsen or if  the condition fails to improve.   Kenard Gower, NP [employer] [phone]

## 2023-01-24 NOTE — Progress Notes (Incomplete)
DATE:  01/24/2023 MRN:  [id number]  BIRTHDAY: 17-Apr-1956   Contact Information   None on File    Other Contacts     Name Relation Home Work [place] Son [phone]  [phone]   [name]   [phone]        Code Status History     Date Active Date Inactive Code Status Order ID Comments User Context   09/21/2021 1616 09/22/2021 1707 Full Code [id number]  Tarry Kos, MD Inpatient      Patient location:  at [facility]  Provider location:  [facility]  Patient was unable to self report due to lack of equipment at home via telehealth    Chief Complaint  Patient presents with  . Acute Visit    Referral Request    HISTORY OF PRESENT ILLNESS: This is a 67 year old female who had a video visit today for referral request. She stated that she was having severe left lower back pain so she went to [facility] ED in [place], [place]. She was told that she needs to see an orthopedic. She has been having left lower back pain for 6 weeks and has been affecting her job. 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 She stated that her pain level is 8/10. She stated that nothing seems to work. She takes Robaxin PRN and Oxycodone PRN.   PAST MEDICAL HISTORY:  Past Medical History:  Diagnosis Date  . Acid reflux    [facility]  . Alcohol use    Per Records received from [facility]- pt states she only has a [place] of wine occasionally  . Arthritis    Knee and hip  . BMI 36.0-36.9,adult    Per Records received from [facility]  . Chronic knee pain    L & R  . GERD (gastroesophageal reflux disease)   . Heart murmur, systolic 09/19/2021   1/6 systolic  . Hyperlipidemia   . Hypertension   . Morbid obesity [facility])    Per Records received from [facility]  . Other osteoarthritis involving multiple joints    Per Records received from [facility]  . Prediabetes    Per Records received from [facility]  . 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(HCC)    [facility]  . Right hip pain    Per records from [facility] the [facility]  . Right thigh pain 2022   Per records from [facility]  . Vitamin D deficiency    Per Records received from [facility]     CURRENT MEDICATIONS: Reviewed  Patient's Medications  New Prescriptions   No medications on file  Previous Medications   CALCIUM CARBONATE (CALCIUM 600) 600 MG TABS TABLET    Take 1 tablet (600 mg total) by mouth 2 (two) times daily with a meal.   CHOLECALCIFEROL (VITAMIN D3) 50 MCG (2000 UT) CAPSULE    Take 1 capsule (2,000 Units total) by mouth daily.   LIDOCAINE (LIDODERM) 5 %    Place 1 patch onto the skin daily. Remove & Discard patch within 12 hours or as directed by MD   LISINOPRIL-HYDROCHLOROTHIAZIDE (ZESTORETIC) 20-12.5 MG TABLET    Take 1 tablet by mouth once daily   METHOCARBAMOL (ROBAXIN) 500 MG TABLET    Take 1 tablet (500 mg total) by mouth every 8 (eight) hours as  needed for muscle spasms.   MULTIPLE VITAMIN (MULTIVITAMIN WITH MINERALS) TABS TABLET    Take 1 tablet by mouth daily.   OXYCODONE (ROXICODONE) 5 MG IMMEDIATE RELEASE TABLET    Take 1 tablet (5 mg total) by mouth every 6 (six) hours as needed for up to 10 doses for severe pain.   PANTOPRAZOLE (PROTONIX) 20 MG TABLET    Take 1 tablet (20 mg total) by mouth daily.   ROSUVASTATIN (CRESTOR) 20 MG TABLET    Take 1 tablet (20 mg total) by mouth daily.  Modified Medications   No medications on file  Discontinued Medications   No medications on file     No Known Allergies   REVIEW OF SYSTEMS:  GENERAL: no change in appetite, no fatigue, no weight changes, no fever, chills or weakness SKIN: Denies rash, itching, wounds, ulcer sores, or nail abnormality EYES: Denies change in vision, dry eyes, eye pain, itching or discharge EARS: Denies change in hearing, ringing in ears, or earache NOSE:  Denies nasal congestion or epistaxis MOUTH and THROAT: Denies oral discomfort, gingival pain or bleeding, pain from teeth or hoarseness   RESPIRATORY: no cough, SOB, DOE, wheezing, hemoptysis CARDIAC: no chest pain, edema or palpitations GI: no abdominal pain, diarrhea, constipation, heart burn, nausea or vomiting GU: Denies dysuria, frequency, hematuria, incontinence, or discharge MUSCULOSKELETAL: Denies joit pain, muscle pain, back pain, restricted movement, or unusual weakness CIRCULATION: Denies claudication, edema of legs, varicosities, or cold extremities NEUROLOGICAL: Denies dizziness, syncope, numbness, or headache PSYCHIATRIC: Denies feeling of depression or anxiety. No report of hallucinations, insomnia, paranoia, or agitation ENDOCRINE: Denies polyphagia, polyuria, polydipsia, heat or cold intolerance HEME/LYMPH: Denies excessive bruising, petechia, enlarged lymph nodes, or bleeding problems IMMUNOLOGIC: Denies history of frequent infections, AIDS, or use of immunosuppressive agents     LABS/RADIOLOGY: Labs reviewed: Basic Metabolic Panel: Recent Labs    05/22/22 1424 10/12/22 0558 10/22/22 0602  NA 141 136 138  K 4.1 3.5 3.5  CL 103 104 102  CO2 31 27 27   GLUCOSE 84 95 116*  BUN 15 14 13   CREATININE 0.91 0.61 0.81  CALCIUM 9.9 9.2 9.7   Liver Function Tests: Recent Labs    05/22/22 1424 10/22/22 0602  AST 16 27  ALT 10 17  ALKPHOS  --  57  BILITOT 0.3 0.9  PROT 7.4 7.1  ALBUMIN  --  3.8   Recent Labs    10/22/22 0602  LIPASE 27   No results for input(s): "AMMONIA" in the last 8760 hours. CBC: Recent Labs    02/10/22 0204 10/12/22 0558 10/22/22 0602  WBC 6.7 7.2 7.2  NEUTROABS  --  2.8 3.5  HGB 12.6 12.6 12.7  HCT 38.6 37.4 39.1  MCV 90.8 92.3 94.2  PLT 254 222 263   A1C: Invalid input(s): "A1C" Lipid Panel: Recent Labs    05/22/22 1424  HDL 65   Cardiac Enzymes: No results for input(s): "CKTOTAL", "CKMB", "CKMBINDEX", "TROPONINI"  in the last 8760 hours. BNP: Invalid input(s): "POCBNP" CBG: No results for input(s): "GLUCAP" in the last 8760 hours.    No results found.  ASSESSMENT/PLAN:       Time spent on non face to face visit:   14 minutes  The patient gave consent to this video visit. Explained to the patient the risk and privacy issue that was involved with this video call.   The patient was advised to call back and ask for an in-person evaluation if the symptoms worsen or if  the condition fails to improve.   Kenard Gower, NP [employer] [phone]

## 2023-02-07 ENCOUNTER — Ambulatory Visit: Payer: Medicare HMO | Admitting: Physician Assistant

## 2023-02-07 DIAGNOSIS — G8929 Other chronic pain: Secondary | ICD-10-CM

## 2023-02-07 DIAGNOSIS — M545 Low back pain, unspecified: Secondary | ICD-10-CM

## 2023-02-07 MED ORDER — PREDNISONE 10 MG (21) PO TBPK: ORAL_TABLET | ORAL | 0 refills | Status: DC

## 2023-02-07 MED ORDER — METHOCARBAMOL 750 MG PO TABS: 750.0000 mg | ORAL_TABLET | Freq: Two times a day (BID) | ORAL | 2 refills | Status: DC | PRN

## 2023-02-07 NOTE — Progress Notes (Signed)
Office Visit Note   Patient: [name]           Date of Birth: 1955-08-11           MRN: [id number] Visit Date: 02/07/2023              Requested by: Gillis Santa, NP [address] [place],  [place] [address] PCP: [name], NP   Assessment & Plan: Visit Diagnoses:  1. Chronic low back pain, unspecified back pain laterality, unspecified whether sciatica present     Plan: Impression is chronic left-sided low back pain without radiculopathy.  At this point, I would like to start the patient on a steroid pack and muscle relaxer and send her to physical therapy.  Referral has been made.  She will follow-up with [place] as needed.  Call with concerns or questions.  Follow-Up Instructions: Return if symptoms worsen or fail to improve.   Orders:  Orders Placed This Encounter  Procedures   Ambulatory referral to Physical Therapy   Meds ordered this encounter  Medications   predniSONE (STERAPRED UNI-PAK 21 TAB) 10 MG (21) TBPK tablet    Sig: Take as directed    Dispense:  21 tablet    Refill:  0   methocarbamol (ROBAXIN-750) 750 MG tablet    Sig: Take 1 tablet (750 mg total) by mouth 2 (two) times daily as needed for muscle spasms.    Dispense:  20 tablet    Refill:  2      Procedures: No procedures performed   Clinical Data: No additional findings.   Subjective: Chief Complaint  Patient presents with   Lower Back - Pain    HPI patient is a pleasant 67 year old female who comes in today with left-sided low back..  She denies any pain, weakness or paresthesias down the left lower extremity.  She works for [employer] and notes that her symptoms are worse at work while she is bending and stooping.  She was seen at the ED in [place] where she was prescribed medications that she cannot remember.  She does note that these did not help.  She denies any history of lumbar pathology.  No bowel or bladder change or saddle  paresthesias.  Review of Systems as detailed in HPI.  All others reviewed and are negative.   Objective: Vital Signs: There were no vitals taken for this visit.  Physical Exam well-developed well-nourished female no acute distress.  Alert and oriented x 3.  Ortho Exam lumbar spine exam: No spinous tenderness.  She has moderate tenderness to the left lower back.  Increased pain with lumbar flexion and extension.  Positive straight leg raise on the left.  No focal weakness.  She is neurovascularly intact distally.  Specialty Comments:  No specialty comments available.  Imaging: No new imaging   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   Primary osteoarthritis of right hip 03/24/2021   TRIGGER FINGER, LEFT THUMB 08/12/2009   Anxiety state 05/20/2009   CANDIDIASIS, SKIN 03/12/2009   TINGLING 12/24/2008   CHEST PAIN 02/03/2008   HYPOKALEMIA 11/07/2007   Other specified disorders of bladder 09/26/2007   HEADACHE 08/07/2007   CONSTIPATION 07/23/2007   MICROSCOPIC HEMATURIA 07/23/2007   DENTAL CARIES 05/27/2007   TINEA PEDIS 05/23/2007   Allergic rhinitis 05/23/2007   GERD 05/23/2007   HIATAL HERNIA 05/23/2007   DEGENERATIVE JOINT DISEASE,  LEFT KNEE 05/23/2007   ESSENTIAL HYPERTENSION, BENIGN 05/20/2007   Past Medical History:  Diagnosis Date   Acid reflux    [facility]   Alcohol use    Per Records received from [facility]- pt states she only has a Ernest of wine occasionally   Arthritis    Knee and hip   BMI 36.0-36.9,adult    Per Records received from [facility]   Chronic knee pain    L & R   GERD (gastroesophageal reflux disease)    Heart murmur, systolic 09/19/2021   1/6 systolic   Hyperlipidemia    Hypertension    Morbid obesity (HCC)    Per Records received from [facility]   Other osteoarthritis involving multiple joints    Per Records  received from [employer]   Prediabetes    Per Records received from [facility]   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[facility])    Per records from [employer] [facility]   Right hip pain    Per records from [employer] [facility] the [facility]   Right thigh pain 2022   Per records from [employer] [facility] the [place]   Vitamin D deficiency    Per Records received from [facility]    Family History  Problem Relation Age of Onset   Hypertension Mother        Per records from [facility] [place]   Cancer Mother 53       colon   Colon cancer Mother        Per records from [facility] [place]   Cancer Father 72       lung   Hypertension Father        Per records from [facility] [place]   Lung disease Father        Per records from [employer] [facility] the [facility]   Seizures Brother    Epilepsy Brother    Drug abuse Daughter    Other Daughter        Murder in 2014   Stomach cancer Neg Hx    Rectal cancer Neg Hx    Esophageal cancer Neg Hx     Past Surgical History:  Procedure Laterality Date   COLONOSCOPY     JOINT REPLACEMENT Left 07/11/2018   knee replacement   TOE SURGERY Right    bone spur "shaved" down- many years ago   TOTAL HIP ARTHROPLASTY Right 09/21/2021   Procedure: RIGHT TOTAL HIP ARTHROPLASTY ANTERIOR APPROACH;  Surgeon: Tarry Kos, MD;  Location: [facility] OR;  Service: Orthopedics;  Laterality: Right;   TUBAL LIGATION  1978   [facility]   Social History   Occupational History   Not on file  Tobacco Use   Smoking status: Never   Smokeless tobacco: Never  Vaping Use   Vaping status: Never Used  Substance and Sexual Activity   Alcohol use: Yes    Comment: pt states she may have 1 Wellman of wine on special occasions   Drug use: No   Sexual activity: Not on file

## 2023-02-13 ENCOUNTER — Encounter: Payer: Self-pay | Admitting: Physical Therapy

## 2023-02-13 ENCOUNTER — Ambulatory Visit: Payer: Medicare HMO | Admitting: Physical Therapy

## 2023-02-13 ENCOUNTER — Other Ambulatory Visit: Payer: Self-pay

## 2023-02-13 DIAGNOSIS — M5459 Other low back pain: Secondary | ICD-10-CM

## 2023-02-13 DIAGNOSIS — R29898 Other symptoms and signs involving the musculoskeletal system: Secondary | ICD-10-CM

## 2023-02-13 DIAGNOSIS — M6281 Muscle weakness (generalized): Secondary | ICD-10-CM | POA: Diagnosis not present

## 2023-02-13 NOTE — Therapy (Signed)
OUTPATIENT PHYSICAL THERAPY THORACOLUMBAR EVALUATION   Patient Name: [name] MRN: [id number] DOB:Jan 06, 1956, 67 y.o., female Today's Date: 02/13/2023  END OF SESSION:  PT End of Session - 02/13/23 1604     Visit Number 1    Number of Visits 13    Date for PT Re-Evaluation 03/27/23    Authorization Type [employer] MCR    Authorization Time Period 02/13/23 to 03/27/23    Progress Note Due on Visit 10    PT Start Time 1433    PT Stop Time 1508   MHP not included in billing   PT Time Calculation (min) 35 min    Activity Tolerance Patient tolerated treatment well    Behavior During Therapy WFL for tasks assessed/performed             Past Medical History:  Diagnosis Date   Acid reflux    Per records [facility]   Alcohol use    Per Records received from [facility]- pt states she only has a Carlson of wine occasionally   Arthritis    Knee and hip   BMI 36.0-36.9,adult    Per Records received from [facility]   Chronic knee pain    L & R   GERD (gastroesophageal reflux disease)    Heart murmur, systolic 09/19/2021   1/6 systolic   Hyperlipidemia    Hypertension    Morbid obesity (HCC)    Per Records received from [facility]   Other osteoarthritis involving multiple joints    Per Records received from [employer]   Prediabetes    Per Records received from [facility]   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[facility])    Per records from [employer] [facility] the [facility]   Right hip pain    Per records from [employer] [facility]   Right thigh pain 2022   Per records from [employer] [facility] the [facility]   Vitamin D deficiency    Per Records received from [facility]   Past Surgical History:  Procedure Laterality Date   COLONOSCOPY     JOINT REPLACEMENT Left 07/11/2018   knee replacement   TOE SURGERY Right    bone spur "shaved" down- many years ago   TOTAL HIP ARTHROPLASTY Right 09/21/2021   Procedure: RIGHT TOTAL HIP  ARTHROPLASTY ANTERIOR APPROACH;  Surgeon: Tarry Kos, MD;  Location: [facility] OR;  Service: Orthopedics;  Laterality: Right;   TUBAL LIGATION  1978   [facility]   Patient Active Problem List   Diagnosis Date Noted   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   Primary osteoarthritis of right hip 03/24/2021   TRIGGER FINGER, LEFT THUMB 08/12/2009   Anxiety state 05/20/2009   CANDIDIASIS, SKIN 03/12/2009   TINGLING 12/24/2008   CHEST PAIN 02/03/2008   HYPOKALEMIA 11/07/2007   Other specified disorders of bladder 09/26/2007   HEADACHE 08/07/2007   CONSTIPATION 07/23/2007   MICROSCOPIC HEMATURIA 07/23/2007   DENTAL CARIES 05/27/2007   TINEA PEDIS 05/23/2007   Allergic rhinitis 05/23/2007   GERD 05/23/2007   HIATAL HERNIA 05/23/2007   DEGENERATIVE JOINT DISEASE, LEFT KNEE 05/23/2007   ESSENTIAL HYPERTENSION, BENIGN 05/20/2007    PCP: Abbey Chatters NP   REFERRING PROVIDER: Cristie Hem, PA-C  REFERRING DIAG: M54.50,G89.29 (ICD-10-CM) - Chronic low back pain, unspecified back pain laterality, unspecified whether sciatica present  Rationale for Evaluation and Treatment: Rehabilitation  THERAPY DIAG:  Other low back pain  Muscle weakness (generalized)  Other symptoms and signs involving the musculoskeletal system  ONSET DATE: chronic   SUBJECTIVE:                                                                                                                                                                                           SUBJECTIVE STATEMENT: This has been going on for awhile, this has been a problem for weeks and weeks. I thought it was my side and I've been back to the ED and to my doctor. Can't remember doing anything specific that started my pain, it actually started about halfway on my way here too. No radicular pains or sciatica. 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 Have to see a doctor for gallstones.   PERTINENT HISTORY:  OA, chronic B knee pain, HLD, HTN, morbid obesity, pre-DM, RA, hip pain, R thigh pain, L TKR, R toe surgery, R THA   PAIN:  Are you having pain? Yes: NPRS scale: 9/10 Pain location: left side going around to back (points to L side), Pain description: sharp Aggravating factors: bending/stooping at work, driving long distances, sitting a certain way  Relieving factors: vibrating pillow   PRECAUTIONS: None  RED FLAGS: None   WEIGHT BEARING RESTRICTIONS: No  FALLS:  Has patient fallen in last 6 months? 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   FINDINGS: There is no evidence of lumbar spine fracture. Alignment is normal. No osteolytic or osteoblastic changes. Early degenerative changes at each lumbar level with small marginal osteophytes.   IMPRESSION: Mild multilevel degenerative changes. No acute osseous abnormalities.     EXAM: THORACIC SPINE - 3 VIEWS   COMPARISON:  None Available.   FINDINGS: There is no evidence of thoracic spine fracture. Alignment is normal. No other significant bone abnormalities are identified.   IMPRESSION: Negative.  PATIENT SURVEYS:  FOTO 67, predicted 74 10 visits     COGNITION: Overall cognitive status: Within functional limits for tasks assessed     SENSATION: Not  tested    POSTURE: rounded shoulders, forward head, decreased lumbar  lordosis, and increased thoracic kyphosis  PALPATION: L lumbar paraspinals tight and tender, L glutes and SI joint sore, L obliques sore and TTP   LUMBAR ROM:   AROM eval  Flexion WNL   Extension Severe limitation  Right lateral flexion Severe limitation   Left lateral flexion Severe limitation   Right rotation Moderate limitation   Left rotation Moderate limitation    (Blank rows = not tested)  LOWER EXTREMITY MMT:     MMT Right eval Left eval  Hip flexion 4- 3+  Hip extension    Hip abduction 4+ 4  Hip adduction    Hip internal rotation    Hip external rotation    Knee flexion 4+ 2+  Knee extension 4+ 4-  Ankle dorsiflexion 5 5  Ankle plantarflexion    Ankle inversion    Ankle eversion     (Blank rows = not tested)    LUMBAR SPECIAL TESTS:  R leg about 1/4 inch longer than L in supine     GAIT: Distance walked: in clinic distances  Assistive device utilized: None Level of assistance: Complete Independence Comments: antalgic, seems to have a leg length difference- sways side to side with gait   TODAY'S TREATMENT:                                                                                                                              DATE:   Eval  Objective measures, care planning, appropriate education  TherEx  TA sets supine 1x10  Lumbar rotation stretch 5x5 seconds B SKTC 3x5 seconds B  SI MET for LLD   MHP lumbar supine hooklying x5 min at EOS not included in billing    PATIENT EDUCATION:  Education details: exam findings, HEP, POC  Person educated: Patient Education method: Explanation, Demonstration, and Handouts Education comprehension: verbalized understanding, returned demonstration, and needs further education  HOME EXERCISE PROGRAM: Access Code: [email or web address] URL: [email or web address] Date: 02/13/2023 Prepared by: Nedra Hai  Exercises - Supine Transversus Abdominis Bracing - Hands on Stomach  - 1-2 x daily - 7 x  weekly - 1 sets - 10 reps - 3 seconds  hold - Supine Lower Trunk Rotation  - 1-2 x daily - 7 x weekly - 1 sets - 10 reps - 5 seconds  hold - Supine Single Knee to Chest Stretch  - 1-2 x daily - 7 x weekly - 1 sets - 10 reps - 5 seconds  hold  ASSESSMENT:  CLINICAL IMPRESSION: Patient is a 67 y.o. F who was seen today for physical therapy evaluation and treatment for low back pain. Exam shows objective impairments. Pain is fairly severe and seems fairly irritable, she is very motivated to improve so that she can tolerate work with less pain. Will benefit from skilled PT services to address all concerns and reduce pain.  Able to reduce pain to 6/10 from  9/10 at EOS.   OBJECTIVE IMPAIRMENTS: Abnormal gait, decreased mobility, difficulty walking, decreased ROM, decreased strength, improper body mechanics, postural dysfunction, obesity, and pain.   ACTIVITY LIMITATIONS: carrying, lifting, bending, sitting, standing, squatting, sleeping, bed mobility, locomotion level, and caring for others  PARTICIPATION LIMITATIONS: cleaning, laundry, shopping, community activity, occupation, and yard work  PERSONAL FACTORS: Age, Behavior pattern, Education, Fitness, Past/current experiences, Profession, Sex, Social background, and Time since onset of injury/illness/exacerbation are also affecting patient's functional outcome.   REHAB POTENTIAL: Fair chronicity of impairments, health literacy level   CLINICAL DECISION MAKING: Stable/uncomplicated  EVALUATION COMPLEXITY: Low   GOALS: Goals reviewed with patient? Yes  SHORT TERM GOALS: Target date: 03/06/2023    Will be compliant with appropriate progressive HEP  Baseline: Goal status: INITIAL  2.  Pain to be no worse than 6/10 with functional task performance  Baseline:  Goal status: INITIAL  3.  Will demonstrate improved awareness of functional posture and ergonomics with all functional tasks  Baseline:  Goal status: INITIAL    LONG TERM GOALS:  Target date: 03/27/2023    MMT to improve by at least 1 grade in all weak groups  Baseline:  Goal status: INITIAL  2.  Lumbar ROM to be no more than 25% limited all planes of motion  Baseline:  Goal status: INITIAL  3.  Will demonstrate good biomechanics for work related tasks to avoid exacerbating pain  Baseline:  Goal status: INITIAL  4.  FOTO score to be with 5 points of predicted  Baseline:  Goal status: INITIAL     PLAN:  PT FREQUENCY: 2x/week  PT DURATION: 6 weeks  PLANNED INTERVENTIONS: Therapeutic exercises, Therapeutic activity, Neuromuscular re-education, Balance training, Gait training, Patient/Family education, Self Care, Aquatic Therapy, Dry Needling, Electrical stimulation, Spinal mobilization, Cryotherapy, Moist heat, Taping, Traction, Ultrasound, Ionotophoresis 4mg /ml Dexamethasone, Manual therapy, and Re-evaluation.  PLAN FOR NEXT SESSION: how is HEP? Review HEP form, otherwise work on lumbar mobility, functional strengthening, biomechanical training   Nedra Hai, PT, DPT 02/13/23 4:06 PM

## 2023-02-15 ENCOUNTER — Telehealth: Payer: Self-pay | Admitting: Physical Therapy

## 2023-02-15 ENCOUNTER — Encounter: Payer: Medicare HMO | Admitting: Physical Therapy

## 2023-02-15 NOTE — Telephone Encounter (Signed)
No-show for today's appt. Called and [facility] about missed appointment and next visit.   Nedra Hai, PT, DPT 02/15/23 1:23 PM

## 2023-02-19 ENCOUNTER — Telehealth: Payer: Self-pay | Admitting: Orthopaedic Surgery

## 2023-02-19 NOTE — Telephone Encounter (Signed)
Patient called. Says PT makes her hurt worse. Does not want to do PT anymore. Would like to Dr. Roda Shutters to know.

## 2023-02-19 NOTE — Telephone Encounter (Signed)
Ok sounds good

## 2023-02-20 ENCOUNTER — Encounter: Payer: Medicare HMO | Admitting: Physical Therapy

## 2023-02-26 ENCOUNTER — Encounter: Payer: Medicare HMO | Admitting: Rehabilitative and Restorative Service Providers"

## 2023-03-01 ENCOUNTER — Encounter: Payer: Medicare HMO | Admitting: Rehabilitative and Restorative Service Providers"

## 2023-03-06 ENCOUNTER — Encounter: Payer: Medicare HMO | Admitting: Rehabilitative and Restorative Service Providers"

## 2023-03-08 ENCOUNTER — Encounter: Payer: Medicare HMO | Admitting: Rehabilitative and Restorative Service Providers"

## 2023-03-09 ENCOUNTER — Telehealth: Payer: Self-pay

## 2023-03-09 NOTE — Telephone Encounter (Signed)
Patient called and left message on clinical intake voicemail asking if a letter can be written stating that her dog is a companion to her. She is trying to move into an apartment and needs a letter for the dog. She states she has had the dog for about 2-3 years.

## 2023-03-13 NOTE — Telephone Encounter (Signed)
Letter written and signed.

## 2023-03-20 ENCOUNTER — Encounter: Payer: Self-pay | Admitting: Physical Therapy

## 2023-03-20 ENCOUNTER — Ambulatory Visit: Payer: Medicare HMO | Admitting: Physical Therapy

## 2023-03-20 DIAGNOSIS — M5459 Other low back pain: Secondary | ICD-10-CM

## 2023-03-20 DIAGNOSIS — M6281 Muscle weakness (generalized): Secondary | ICD-10-CM

## 2023-03-20 DIAGNOSIS — R29898 Other symptoms and signs involving the musculoskeletal system: Secondary | ICD-10-CM

## 2023-03-20 NOTE — Therapy (Addendum)
OUTPATIENT PHYSICAL THERAPY TREATMENT PROGRESS NOTE/RECERTIFICATION   / DISCHARGE   Progress Note Reporting Period 02/13/23 to 03/20/23  See note below for Objective Data and Assessment of Progress/Goals.    Patient Name: [name] MRN: [id number] DOB:[birth year], 67 y.o., female Today's Date: 03/20/2023  END OF SESSION:  PT End of Session - 03/20/23 1309     Visit Number 2    Number of Visits 10    Date for PT Re-Evaluation 05/15/23    Authorization Type [employer] MCR    Progress Note Due on Visit 12    PT Start Time 1309    PT Stop Time 1340    PT Time Calculation (min) 31 min    Activity Tolerance Patient tolerated treatment well    Behavior During Therapy WFL for tasks assessed/performed              Past Medical History:  Diagnosis Date   Acid reflux    [facility]   Alcohol use    Per Records received from [facility]- pt states she only has a [place] of wine occasionally   Arthritis    Knee and hip   BMI 36.0-36.9,adult    Per Records received from [facility]   Chronic knee pain    L & R   GERD (gastroesophageal reflux disease)    Heart murmur, systolic 09/19/2021   1/6 systolic   Hyperlipidemia    Hypertension    Morbid obesity (HCC)    Per Records received from [facility]   Other osteoarthritis involving multiple joints    Per Records received from [employer]   Prediabetes    Per Records received from [facility]   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[facility])    Per records from [employer] [facility]   Right hip pain    Per records from [employer] [facility]   Right thigh pain 2022   Per records from [employer] [facility]   Vitamin D deficiency    Per Records received from [facility]   Past Surgical History:  Procedure Laterality Date   COLONOSCOPY     JOINT REPLACEMENT Left 07/11/2018   knee replacement   TOE SURGERY Right    bone spur "shaved" down- many years ago    TOTAL HIP ARTHROPLASTY Right 09/21/2021   Procedure: RIGHT TOTAL HIP ARTHROPLASTY ANTERIOR APPROACH;  Surgeon: Tarry Kos, MD;  Location: [facility] OR;  Service: Orthopedics;  Laterality: Right;   TUBAL LIGATION  1978   [facility]   Patient Active Problem List   Diagnosis Date Noted   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   Primary osteoarthritis of right hip 03/24/2021   TRIGGER FINGER, LEFT THUMB 08/12/2009   Anxiety state 05/20/2009   CANDIDIASIS, SKIN 03/12/2009   TINGLING 12/24/2008   CHEST PAIN 02/03/2008   HYPOKALEMIA 11/07/2007   Other specified disorders of bladder 09/26/2007   HEADACHE 08/07/2007   CONSTIPATION 07/23/2007   MICROSCOPIC HEMATURIA 07/23/2007   DENTAL CARIES 05/27/2007   TINEA PEDIS 05/23/2007   Allergic rhinitis 05/23/2007   GERD 05/23/2007   HIATAL HERNIA 05/23/2007   DEGENERATIVE JOINT DISEASE, LEFT KNEE 05/23/2007   ESSENTIAL HYPERTENSION, BENIGN 05/20/2007    PCP: Abbey Chatters NP   REFERRING PROVIDER: Cristie Hem, PA-C  REFERRING DIAG: M54.50,G89.29 (ICD-10-CM) - Chronic low back pain, unspecified back pain laterality, unspecified whether  sciatica present  Rationale for Evaluation and Treatment: Rehabilitation  THERAPY DIAG:  Other low back pain - Plan: PT plan of care cert/re-cert  Muscle weakness (generalized) - Plan: PT plan of care cert/re-cert  Other symptoms and signs involving the musculoskeletal system - Plan: PT plan of care cert/re-cert  ONSET DATE: chronic   SUBJECTIVE:                                                                                                                                                                                           SUBJECTIVE STATEMENT: Pt reports after first session her pain was "so bad" that she decided she wasn't coming back.  She returned to work but pain returned, so she is returning.  She states she needs PT in order to  get an MRI.  She reports she isn't doing any exercises from initial visit.   PERTINENT HISTORY:  OA, chronic B knee pain, HLD, HTN, morbid obesity, pre-DM, RA, hip pain, R thigh pain, L TKR, R toe surgery, R THA   PAIN:  Are you having pain? Yes: NPRS scale: 7 currently, up to 10, at best 5/10 Pain location: low back, left side going around to back (points to L side), denies any pain in LLE Pain description: sharp Aggravating factors: bending/stooping at work, driving long distances, sitting a certain way  Relieving factors: "nothing"  PRECAUTIONS: None  RED FLAGS: None   WEIGHT BEARING RESTRICTIONS: No  FALLS:  Has patient fallen in last 6 months? 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 No acute osseous abnormalities.  PATIENT SURVEYS:  EVAL: FOTO 67, predicted 74 10 visits   COGNITION: Overall cognitive status: Within functional limits for tasks assessed    POSTURE: rounded shoulders, forward head, decreased lumbar lordosis, and increased thoracic kyphosis  PALPATION: EVAL: L lumbar paraspinals tight and tender, L glutes and SI joint sore, L obliques sore and TTP     LUMBAR ROM:   AROM eval 03/20/23  Flexion WNL  WNL  Extension Severe limitation WNL  Right lateral flexion Severe limitation  WNL  Left lateral flexion Severe limitation  WNL  Right rotation Moderate limitation  Limited 25%  Left rotation Moderate limitation  Limited 25%   (Blank rows = not tested)  LOWER  EXTREMITY MMT:     MMT Right eval Left eval Right/Left 03/20/23  Hip flexion 4- 3+ 4/4  Hip  extension     Hip abduction 4+ 4 4/4  Hip adduction     Hip internal rotation     Hip external rotation     Knee flexion 4+ 2+ 5/5  Knee extension 4+ 4- 5/5  Ankle dorsiflexion 5 5    (Blank rows = not tested)    LUMBAR SPECIAL TESTS:  EVAL: R leg about 1/4 inch longer than L in supine   03/20/23: Slump negative bil    GAIT: Distance walked: in clinic distances  Assistive device utilized: None Level of assistance: Complete Independence Comments: antalgic, seems to have a leg length difference- sways side to side with gait   TODAY'S TREATMENT:                                                                                                                              DATE:  03/20/23 See above for objective measures  See HEP - trial reps performed with min cues needed for comprehension  Discussed aquatic therapy - pt very interested and will set up for aquatic therapy  Eval Objective measures, care planning, appropriate education  TherEx TA sets supine 1x10  Lumbar rotation stretch 5x5 seconds B SKTC 3x5 seconds B  SI MET for LLD   MHP lumbar supine hooklying x5 min at EOS not included in billing    PATIENT EDUCATION:  Education details: exam findings, HEP, POC  Person educated: Patient Education method: Explanation, Demonstration, and Handouts Education comprehension: verbalized understanding, returned demonstration, and needs further education  HOME EXERCISE PROGRAM: Access Code: [email or web address] URL: [email or web address] Date: 03/20/2023 Prepared by: Moshe Cipro  Exercises - Supine Transversus Abdominis Bracing - Hands on Stomach  - 1-2 x daily - 7 x weekly - 1 sets - 10 reps - 3 seconds  hold - Supine Single Knee to Chest Stretch  - 1-2 x daily - 7 x weekly - 1 sets - 5 reps - 10-15 seconds  hold - Supine Piriformis Stretch with Foot on Ground  - 1-2 x daily - 7 x weekly - 1 sets - 5 reps - 10-15 sec hold - Self Release   - 1-2 x daily - 7 x  weekly - 1 sets - 1 reps - 3-5 min hold  Patient Education - [facility]  ASSESSMENT:  CLINICAL IMPRESSION: Pt returns to PT after canceling all her appts initially due to increased pain.  Overall no significant changes noted, she does have a little more lumbar ROM than prior session.  Will benefit from PT to address deficits and will plan to utilize aquatic therapy as well to help with activity tolerance.   OBJECTIVE IMPAIRMENTS: Abnormal gait, decreased activity tolerance, decreased mobility, difficulty walking, decreased ROM, decreased strength, hypomobility, increased fascial restrictions, increased muscle spasms, improper body mechanics, postural dysfunction, and pain.   ACTIVITY LIMITATIONS: carrying,  lifting, bending, sitting, standing, squatting, sleeping, bed mobility, locomotion level, and caring for others  PARTICIPATION LIMITATIONS: cleaning, laundry, shopping, community activity, occupation, and yard work  PERSONAL FACTORS: Age, Behavior pattern, Education, Fitness, Past/current experiences, Profession, Sex, Social background, and Time since onset of injury/illness/exacerbation are also affecting patient's functional outcome.   REHAB POTENTIAL: Fair chronicity of impairments, health literacy level   CLINICAL DECISION MAKING: Stable/uncomplicated  EVALUATION COMPLEXITY: Low   GOALS: Goals reviewed with patient? Yes  SHORT TERM GOALS: Target date: 04/17/2023   Will be compliant with appropriate progressive HEP  Baseline: Goal status: ONGOING 03/20/23  2.  Pain to be no worse than 6/10 with functional task performance  Baseline:  Goal status: ONGOING 03/20/23  3.  Will demonstrate improved awareness of functional posture and ergonomics with all functional tasks  Baseline:  Goal status: ONGOING 03/20/23    LONG TERM GOALS: Target date: 05/15/2023  MMT to improve by at least 1 grade in all weak groups  Baseline:  Goal status: ONGOING 03/20/23  2.  Lumbar  ROM WNL without increase in pain all direction Baseline:  Goal status: REVISED 03/20/23  3.  Will demonstrate good biomechanics for work related tasks to avoid exacerbating pain  Baseline:  Goal status: ONGOING 03/20/23  4.  FOTO score to be with 5 points of predicted  Baseline:  Goal status: ONGOING 03/20/23     PLAN:  PT FREQUENCY: 1-2x/week  PT DURATION: 8 weeks  PLANNED INTERVENTIONS: Therapeutic exercises, Therapeutic activity, Neuromuscular re-education, Balance training, Gait training, Patient/Family education, Self Care, Aquatic Therapy, Dry Needling, Electrical stimulation, Spinal mobilization, Cryotherapy, Moist heat, Taping, Traction, Ultrasound, Ionotophoresis 4mg /ml Dexamethasone, Manual therapy, and Re-evaluation.  PLAN FOR NEXT SESSION: Review HEP, otherwise work on lumbar mobility, functional strengthening, aquatic HEP   NEXT MD VISIT: unsure/PRN with referring     Clarita Crane, PT, DPT 03/20/23 2:00 PM    PHYSICAL THERAPY DISCHARGE SUMMARY  Visits from Start of Care: 2  Current functional level related to goals / functional outcomes: See note   Remaining deficits: See note   Education / Equipment: HEP  Patient goals were partially met. Patient is being discharged due to not returning since the last visit.  Chyrel Masson, PT, DPT, OCS, ATC 05/08/23  9:39 AM

## 2023-03-23 ENCOUNTER — Ambulatory Visit: Payer: Medicare HMO | Admitting: Physical Therapy

## 2023-03-28 ENCOUNTER — Encounter: Payer: Medicare HMO | Admitting: Physical Therapy

## 2023-04-04 ENCOUNTER — Encounter: Payer: Medicare HMO | Admitting: Physical Therapy

## 2023-04-06 ENCOUNTER — Encounter: Payer: Medicare HMO | Admitting: Physical Therapy

## 2023-04-11 ENCOUNTER — Encounter: Payer: Medicare HMO | Admitting: Physical Therapy

## 2023-04-12 ENCOUNTER — Telehealth: Payer: Self-pay

## 2023-04-12 NOTE — Telephone Encounter (Signed)
Patient is calling to confirm a appointment.

## 2023-04-13 ENCOUNTER — Ambulatory Visit (INDEPENDENT_AMBULATORY_CARE_PROVIDER_SITE_OTHER): Payer: Medicare HMO | Admitting: Family

## 2023-04-13 ENCOUNTER — Encounter: Payer: Self-pay | Admitting: Family

## 2023-04-13 VITALS — BP 150/100 | HR 83 | Temp 97.8°F | Resp 16 | Ht 59.0 in | Wt 158.2 lb

## 2023-04-13 DIAGNOSIS — M5416 Radiculopathy, lumbar region: Secondary | ICD-10-CM | POA: Diagnosis not present

## 2023-04-13 DIAGNOSIS — G8929 Other chronic pain: Secondary | ICD-10-CM | POA: Diagnosis not present

## 2023-04-13 MED ORDER — CYCLOBENZAPRINE HCL 5 MG PO TABS
5.0000 mg | ORAL_TABLET | Freq: Three times a day (TID) | ORAL | 1 refills | Status: DC | PRN
Start: 2023-04-13 — End: 2023-06-04

## 2023-04-13 NOTE — Patient Instructions (Signed)
-   Apply warm compressor to lower back for 15 -20 minutes daily for pain  - Notify provider if symptoms worsen or fail to improve

## 2023-04-13 NOTE — Progress Notes (Signed)
Provider: Yohance Hathorne FNP-C  [name], NP  Patient Care Team: [name], NP as PCP - General (Geriatric Medicine)  Extended Emergency Contact Information Primary Emergency Contact: CARTER,GILDA Mobile Phone: [phone] Relation: Sister Secondary Emergency Contact: Inis Sizer Address: [address]          [place], [place] [address] [place] of [place] Home Phone: [phone] Mobile Phone: [phone] Relation: Son  Code Status:  Full Code  Goals of care: Advanced Directive information    04/13/2023    8:53 AM  Advanced Directives  Does Patient Have a Medical Advance Directive? No     Chief Complaint  Patient presents with   Acute Visit    Back pain    HPI:  Pt is a 67 y.o. 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 Has a significant degenerative disc disease. She denies any lower extremities weakness,numbness or tingling.   Past Medical History:  Diagnosis Date   Acid reflux    [facility]   Alcohol use    Per Records received from [facility]- pt states she only has a [place] of wine occasionally   Arthritis    Knee and hip   BMI 36.0-36.9,adult    Per [facility] received from [facility]   Chronic knee pain    L & R   GERD (gastroesophageal reflux disease)    Heart murmur, systolic 09/19/2021   1/6 systolic   Hyperlipidemia    Hypertension    Morbid obesity (HCC)    Per Records received from [facility]   Other osteoarthritis involving multiple joints    Per Records received from [employer]   Prediabetes    Per Records received from [facility]   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[facility])     Per records from [employer] [facility]   Right hip pain    Per records from [employer] [facility]   Right thigh pain 2022   Per records from [employer] [facility]   Vitamin D deficiency    Per Records received from [facility]   Past Surgical History:  Procedure Laterality Date   COLONOSCOPY     JOINT REPLACEMENT Left 07/11/2018   knee replacement   TOE SURGERY Right    bone spur "shaved" down- many years ago   TOTAL HIP ARTHROPLASTY Right 09/21/2021   Procedure: RIGHT TOTAL HIP ARTHROPLASTY ANTERIOR APPROACH;  Surgeon: Tarry Kos, MD;  Location: [facility] OR;  Service: Orthopedics;  Laterality: Right;   TUBAL LIGATION  1978   [facility]    No Known Allergies  Outpatient Encounter Medications as of 04/13/2023  Medication Sig   calcium carbonate (CALCIUM 600) 600 MG TABS tablet Take 1 tablet (600 mg total) by mouth 2 (two) times daily with a meal.   Cholecalciferol (VITAMIN D3) 50 MCG (2000 UT) capsule Take 1 capsule (2,000 Units total) by mouth daily.   lisinopril-hydrochlorothiazide (ZESTORETIC) 20-12.5 MG tablet Take 1 tablet by mouth once daily   Multiple Vitamin (MULTIVITAMIN WITH MINERALS) TABS tablet Take 1 tablet by mouth  daily.   pantoprazole (PROTONIX) 20 MG tablet Take 1 tablet (20 mg total) by mouth daily.   rosuvastatin (CRESTOR) 20 MG tablet Take 1 tablet (20 mg total) by mouth daily.   [DISCONTINUED] lidocaine (LIDODERM) 5 % Place 1 patch onto the skin daily. Remove & Discard patch within 12 hours or as directed by MD   [DISCONTINUED] methocarbamol (ROBAXIN) 500 MG tablet Take 1 tablet (500 mg total) by mouth every 8 (eight) hours as needed for muscle spasms.   [DISCONTINUED] methocarbamol (ROBAXIN-750) 750 MG tablet Take 1 tablet (750 mg total) by mouth 2 (two) times daily as needed for muscle spasms.   [DISCONTINUED] oxyCODONE (ROXICODONE) 5 MG immediate release tablet Take 1  tablet (5 mg total) by mouth every 6 (six) hours as needed for up to 10 doses for severe pain.   [DISCONTINUED] predniSONE (STERAPRED UNI-PAK 21 TAB) 10 MG (21) TBPK tablet Take as directed   No facility-administered encounter medications on file as of 04/13/2023.    Review of Systems  Constitutional:  Negative for appetite change, chills, fatigue, fever and unexpected weight change.  Eyes:  Negative for pain, discharge, redness, itching and visual disturbance.  Respiratory:  Negative for cough, chest tightness, shortness of breath and wheezing.   Cardiovascular:  Negative for chest pain, palpitations and leg swelling.  Gastrointestinal:  Negative for abdominal distention, abdominal pain, constipation, diarrhea, nausea and vomiting.  Genitourinary:  Negative for difficulty urinating, dysuria, flank pain, frequency and urgency.  Musculoskeletal:  Positive for arthralgias, back pain and gait problem. Negative for joint swelling, myalgias, neck pain and neck stiffness.  Skin:  Negative for color change, pallor and rash.  Neurological:  Negative for dizziness, speech difficulty, weakness, light-headedness, numbness and headaches.    Immunization History  Administered Date(s) Administered   Fluad Quad(high Dose 65+) 03/09/2021, 05/22/2022   Influenza Whole 04/19/2009   Influenza,inj,Quad PF,6+ Mos 05/17/2018   Moderna SARS-COV2 Booster Vaccination 01/14/2021   Moderna Sars-Covid-2 Vaccination 04/16/2020, 05/14/2020   PNEUMOCOCCAL CONJUGATE-20 04/25/2021   Pfizer Covid-19 Vaccine Bivalent Booster 49yrs & up 05/28/2021   Td 08/10/2006   Pertinent  Health Maintenance Due  Topic Date Due   Colonoscopy  Never done   INFLUENZA VACCINE  02/08/2023   MAMMOGRAM  08/30/2023   DEXA SCAN  Completed      07/14/2022    8:03 PM 07/14/2022    8:04 PM 11/02/2022    8:52 AM 01/24/2023    2:58 PM 04/13/2023    8:53 AM  Fall Risk  Falls in the past year?   0 0 0  Was there an injury with Fall?   0 0    Fall Risk Category Calculator   0 0   (RETIRED) Patient Fall Risk Level Low fall risk Low fall risk     Patient at Risk for Falls Due to   No Fall Risks No Fall Risks   Fall risk Follow up   Falls evaluation completed Falls evaluation completed    Functional Status Survey:    Vitals:   04/13/23 0855  BP: (!) 150/100  Pulse: 83  Resp: 16  Temp: 97.8 F (36.6 C)  SpO2: 98%  Weight: 158 lb 3.2 oz (71.8 kg)  Height: 4\' 11"  (1.499 m)   Body mass index is 31.95 kg/m. Physical Exam Vitals reviewed.  Constitutional:      General: She is not in acute distress.    Appearance: Normal appearance. She is normal weight. She is not ill-appearing or diaphoretic.  HENT:     Head: Normocephalic.  Eyes:     General: No scleral icterus.       Right eye: No discharge.        Left eye: No discharge.     Conjunctiva/sclera: Conjunctivae normal.     Pupils: Pupils are equal, round, and reactive to light.  Neck:     Vascular: No carotid bruit.  Cardiovascular:     Rate and Rhythm: Normal rate and regular rhythm.     Pulses: Normal pulses.     Heart sounds: Normal heart sounds. No murmur heard.    No friction rub. No gallop.  Pulmonary:     Effort: Pulmonary effort is normal. No respiratory distress.     Breath sounds: Normal breath sounds. No wheezing, rhonchi or rales.  Chest:     Chest wall: No tenderness.  Abdominal:     General: Bowel sounds are normal. There is no distension.     Palpations: Abdomen is soft. There is no mass.     Tenderness: There is no abdominal tenderness. There is no right CVA tenderness, left CVA tenderness, guarding or rebound.  Musculoskeletal:        General: No swelling or tenderness. Normal range of motion.     Cervical back: Normal range of motion. No rigidity or tenderness.     Right lower leg: No edema.     Left lower leg: No edema.  Lymphadenopathy:     Cervical: No cervical adenopathy.  Neurological:     Mental Status: She is alert and oriented  to person, place, and time.     Cranial Nerves: No cranial nerve deficit.     Sensory: No sensory deficit.     Motor: No weakness.     Coordination: Coordination normal.     Gait: Gait abnormal.  Psychiatric:        Mood and Affect: Mood normal.        Speech: Speech normal.        Behavior: Behavior normal.     Labs reviewed: Recent Labs    05/22/22 1424 10/12/22 0558 10/22/22 0602  NA 141 136 138  K 4.1 3.5 3.5  CL 103 104 102  CO2 31 27 27   GLUCOSE 84 95 116*  BUN 15 14 13   CREATININE 0.91 0.61 0.81  CALCIUM 9.9 9.2 9.7   Recent Labs    05/22/22 1424 10/22/22 0602  AST 16 27  ALT 10 17  ALKPHOS  --  57  BILITOT 0.3 0.9  PROT 7.4 7.1  ALBUMIN  --  3.8   Recent Labs    10/12/22 0558 10/22/22 0602  WBC 7.2 7.2  NEUTROABS 2.8 3.5  HGB 12.6 12.7  HCT 37.4 39.1  MCV 92.3 94.2  PLT 222 263   Lab Results  Component Value Date   TSH 0.65 01/14/2021   Lab Results  Component Value Date   HGBA1C 6.2 (H) 05/22/2022   Lab Results  Component Value Date   CHOL 167 05/22/2022   HDL 65 05/22/2022   LDLCALC 85 05/22/2022   TRIG 82 05/22/2022   CHOLHDL 2.6 05/22/2022    Significant Diagnostic Results in last 30 days:  No results found.  Assessment/Plan   Chronic radicular pain of lower back Reports pain has worsen could not tolerate therapy which made the pain worse. - Advised to reschedule appointment with Orthopedic for reevaluation  She tried her daughter's flexeril which helped with the pain. - start on flexeril  5 mg tablet three times daily as needed for pain.  Family/ staff Communication: Reviewed plan of care with patient verbalized understanding.  Labs/tests ordered: None   Next Appointment: Return if symptoms worsen or fail to improve.   Caesar Bookman, NP

## 2023-04-16 ENCOUNTER — Telehealth: Payer: Self-pay | Admitting: Orthopaedic Surgery

## 2023-04-16 ENCOUNTER — Other Ambulatory Visit: Payer: Self-pay

## 2023-04-16 DIAGNOSIS — G8929 Other chronic pain: Secondary | ICD-10-CM

## 2023-04-16 NOTE — Telephone Encounter (Signed)
We can go ahead and get an mri of the lumbar spine and have her fu with megan after

## 2023-04-16 NOTE — Telephone Encounter (Signed)
Patient called and said that [name] told her to call and see what are some other options for her because she can't do physical therapy  because it hurts her back so bad. CB#[phone]

## 2023-04-16 NOTE — Telephone Encounter (Signed)
Ordered MRI and placed referral to [facility]. Notified patient via voicemail.

## 2023-04-20 ENCOUNTER — Ambulatory Visit: Payer: Medicare HMO | Admitting: Physical Therapy

## 2023-04-27 ENCOUNTER — Other Ambulatory Visit: Payer: Self-pay | Admitting: General Surgery

## 2023-04-30 ENCOUNTER — Encounter: Payer: Medicare HMO | Admitting: Nurse Practitioner

## 2023-05-01 LAB — SURGICAL PATHOLOGY

## 2023-05-08 ENCOUNTER — Ambulatory Visit (INDEPENDENT_AMBULATORY_CARE_PROVIDER_SITE_OTHER): Payer: Medicare HMO | Admitting: Nurse Practitioner

## 2023-05-08 ENCOUNTER — Encounter: Payer: Self-pay | Admitting: Nurse Practitioner

## 2023-05-08 ENCOUNTER — Telehealth: Payer: Self-pay | Admitting: *Deleted

## 2023-05-08 DIAGNOSIS — Z1211 Encounter for screening for malignant neoplasm of colon: Secondary | ICD-10-CM | POA: Diagnosis not present

## 2023-05-08 DIAGNOSIS — Z1212 Encounter for screening for malignant neoplasm of rectum: Secondary | ICD-10-CM | POA: Diagnosis not present

## 2023-05-08 DIAGNOSIS — Z1231 Encounter for screening mammogram for malignant neoplasm of breast: Secondary | ICD-10-CM

## 2023-05-08 DIAGNOSIS — Z0000 Encounter for general adult medical examination without abnormal findings: Secondary | ICD-10-CM

## 2023-05-08 NOTE — Progress Notes (Signed)
Subjective:   [name] is a 67 y.o. female who presents for Medicare Annual (Subsequent) preventive examination.  Visit Complete: Virtual I connected with  Araceli Bouche on 05/08/23 by a video and audio enabled telemedicine application and verified that I am speaking with the correct person using two identifiers.  Patient Location: Home  Provider Location: Office/Clinic  I discussed the limitations of evaluation and management by telemedicine. The patient expressed understanding and agreed to proceed.  Vital Signs: Because this visit was a virtual/telehealth visit, some criteria may be missing or patient reported. Any vitals not documented were not able to be obtained and vitals that have been documented are patient reported.   Cardiac Risk Factors include: advanced age (>20men, >79 women);dyslipidemia;hypertension;obesity (BMI >30kg/m2)     Objective:    There were no vitals filed for this visit. There is no height or weight on file to calculate BMI.     05/08/2023    9:21 AM 04/13/2023    8:53 AM 02/13/2023    2:31 PM 01/24/2023    2:58 PM 11/02/2022    8:53 AM 05/22/2022    1:47 PM 04/27/2022    8:14 AM  Advanced Directives  Does Patient Have a Medical Advance Directive? No No No No No No No  Would patient like information on creating a medical advance directive? No - Patient declined  No - Patient declined No - Patient declined No - Patient declined No - Patient declined No - Patient declined    Current Medications (verified) Outpatient Encounter Medications as of 05/08/2023  Medication Sig   calcium carbonate (CALCIUM 600) 600 MG TABS tablet Take 1 tablet (600 mg total) by mouth 2 (two) times daily with a meal.   Cholecalciferol (VITAMIN D3) 50 MCG (2000 UT) capsule Take 1 capsule (2,000 Units total) by mouth daily.   cyclobenzaprine (FLEXERIL) 5 MG tablet Take 1 tablet (5 mg total) by mouth 3 (three) times daily as needed for muscle spasms.    lisinopril-hydrochlorothiazide (ZESTORETIC) 20-12.5 MG tablet Take 1 tablet by mouth once daily   Multiple Vitamin (MULTIVITAMIN WITH MINERALS) TABS tablet Take 1 tablet by mouth daily.   pantoprazole (PROTONIX) 20 MG tablet Take 1 tablet (20 mg total) by mouth daily.   rosuvastatin (CRESTOR) 20 MG tablet Take 1 tablet (20 mg total) by mouth daily.   No facility-administered encounter medications on file as of 05/08/2023.    Allergies (verified) Patient has no known allergies.   History: Past Medical History:  Diagnosis Date   Acid reflux    [facility]   Alcohol use    Per Records received from [facility]- pt states she only has a Nathanson of wine occasionally   Arthritis    Knee and hip   BMI 36.0-36.9,adult    Per Records received from [facility]   Chronic knee pain    L & R   GERD (gastroesophageal reflux disease)    Heart murmur, systolic 09/19/2021   1/6 systolic   Hyperlipidemia    Hypertension    Morbid obesity (HCC)    Per [facility] received from [facility]   Other osteoarthritis involving multiple joints    Per Records received from [facility]   Prediabetes    Per Records received from [facility]   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[facility])    Per records from [employer] [facility]   Right hip pain    [facility] records [facility]   Right thigh pain 2022   Per records from [facility]   Vitamin D deficiency    Per Records received from [facility]   Past Surgical History:  Procedure Laterality Date   COLONOSCOPY     JOINT REPLACEMENT Left 07/11/2018   knee replacement   TOE SURGERY Right    bone spur "shaved" down- many years ago   TOTAL HIP ARTHROPLASTY Right 09/21/2021   Procedure: RIGHT TOTAL HIP ARTHROPLASTY ANTERIOR APPROACH;  Surgeon: Tarry Kos, MD;  Location: [facility] OR;  Service: Orthopedics;  Laterality: Right;   TUBAL LIGATION  1978   [facility]   Family History  Problem Relation Age of Onset   Hypertension Mother        [facility]   Cancer Mother 47       colon   Colon cancer Mother        [facility] [place]   Cancer Father 72       lung   Hypertension Father        Per records from [facility] [place]   Lung disease Father        Per records from [employer] [facility]   Seizures Brother    Epilepsy Brother    Drug abuse Daughter    Other Daughter        Murder in 2014   Stomach cancer Neg Hx    Rectal cancer Neg Hx    Esophageal cancer Neg Hx    Social History   Socioeconomic History   Marital status: Divorced    Spouse name: Not on file   Number of children: 4   Years of education: Not on file   Highest education level: Not on file  Occupational History   Not on file  Tobacco Use   Smoking status: Never   Smokeless tobacco: Never  Vaping Use   Vaping status: Never Used  Substance and Sexual Activity   Alcohol use: Yes    Comment: pt states she may have 1 Asaro of wine on special occasions   Drug use: No   Sexual activity: Not on file  Other Topics Concern   Not on file  Social History Narrative   Diet: none      Caffeine: yes      Married, if yes what year: Divorced, 1972      Do you live in a house, apartment, assisted living, condo, trailer, ect: Apartment      Is it one or more stories:       How many persons live in your home? 1      Pets: yes ( shih-tzu)      Highest level or education completed: 10th      Current/Past profession:      Exercise:  No                Type and how often:          Living Will: No     DNR: No     POA/HPOA: No        Functional Status:   Do you have difficulty bathing or dressing yourself? No     Do you have difficulty preparing food or eating? No     Do you  have difficulty managing your  medications? No     Do you have difficulty managing your finances? No     Do you have difficulty affording your medications?  No     Social Determinants of Corporate investment banker Strain: Not on file  Food Insecurity: Not on file  Transportation Needs: Not on file  Physical Activity: Not on file  Stress: Not on file  Social Connections: Not on file    Tobacco Counseling Counseling given: Not Answered   Clinical Intake:  Pre-visit preparation completed: Yes  Pain : No/denies pain     BMI - recorded: 31 Nutritional Risks: None Diabetes: No  How often do you need to have someone help you when you read instructions, pamphlets, or other written materials from your doctor or pharmacy?: 1 - Never         Activities of Daily Living    05/08/2023    9:48 AM  In your present state of health, do you have any difficulty performing the following activities:  Hearing? 0  Vision? 1  Difficulty concentrating or making decisions? 0  Walking or climbing stairs? 0  Dressing or bathing? 0  Doing errands, shopping? 0  Preparing Food and eating ? N  Using the Toilet? N  In the past six months, have you accidently leaked urine? N  Do you have problems with loss of bowel control? N  Managing your Medications? N  Managing your Finances? N  Housekeeping or managing your Housekeeping? N    Patient Care Team: [name], NP as PCP - General (Geriatric Medicine)  Indicate any recent Medical Services you may have received from other than [facility] providers in the past year (date may be approximate).     Assessment:   This is a routine wellness examination for Atkins.  Hearing/Vision screen [facility] [address] Last Exam: 11/2022   Goals Addressed   None    Depression Screen    05/08/2023    9:22 AM 04/13/2023    8:53 AM 01/24/2023    2:58 PM 04/27/2022    8:48 AM 04/25/2021    9:06 AM  PHQ 2/9 Scores  PHQ - 2 Score 0 0 0 0 0   PHQ- 9 Score   0      Fall Risk    05/08/2023    9:22 AM 04/13/2023    8:53 AM 01/24/2023    2:58 PM 11/02/2022    8:52 AM 05/22/2022    1:47 PM  Fall Risk   Falls in the past year? 0 0 0 0 0  Number falls in past yr: 0  0 0 0  Injury with Fall? 0  0 0 0  Risk for fall due to :   No Fall Risks No Fall Risks No Fall Risks  Follow up   Falls evaluation completed Falls evaluation completed Falls evaluation completed    MEDICARE RISK AT HOME: Medicare Risk at Home Any stairs in or around the home?: No Home free of loose throw rugs in walkways, pet beds, electrical cords, etc?: Yes Adequate lighting in your home to reduce risk of falls?: Yes Life alert?: No Use of a cane, walker or w/c?: Yes Grab bars in the bathroom?: No Shower chair or bench in shower?: No  TIMED UP AND GO:  Was the test performed?  No    Cognitive Function:    04/25/2021    9:10 AM  MMSE - Mini Mental  State Exam  Orientation to time 5  Orientation to Place 5  Registration 3  Attention/ Calculation 5  Recall 2  Recall-comments missed penny  Language- name 2 objects 2  Language- repeat 1  Language- follow 3 step command 2  Language- follow 3 step command-comments took paper with left hand  Language- read & follow direction 1  Write a sentence 1  Copy design 1  Total score 28        05/08/2023    9:23 AM 04/27/2022    8:15 AM  6CIT Screen  What Year? 0 points 0 points  What month? 0 points 0 points  What time? 0 points 0 points  Count back from 20 0 points 0 points  Months in reverse 0 points 0 points  Repeat phrase 0 points 2 points  Total Score 0 points 2 points    Immunizations Immunization History  Administered Date(s) Administered   Fluad Quad(high Dose 65+) 03/09/2021, 05/22/2022   Influenza Whole 04/19/2009   Influenza,inj,Quad PF,6+ Mos 05/17/2018   Moderna SARS-COV2 Booster Vaccination 01/14/2021   Moderna Sars-Covid-2 Vaccination 04/16/2020, 05/14/2020   PNEUMOCOCCAL  CONJUGATE-20 04/25/2021   Pfizer Covid-19 Vaccine Bivalent Booster 105yrs & up 05/28/2021   Td 08/10/2006    TDAP status: Due, Education has been provided regarding the importance of this vaccine. Advised may receive this vaccine at local pharmacy or Health Dept. Aware to provide a copy of the vaccination record if obtained from local pharmacy or Health Dept. Verbalized acceptance and understanding.  Flu Vaccine status: Due, Education has been provided regarding the importance of this vaccine. Advised may receive this vaccine at local pharmacy or Health Dept. Aware to provide a copy of the vaccination record if obtained from local pharmacy or Health Dept. Verbalized acceptance and understanding.  Pneumococcal vaccine status: Up to date  Covid-19 vaccine status: Information provided on how to obtain vaccines.   Qualifies for Shingles Vaccine? Yes   Zostavax completed No   Shingrix Completed?: No.    Education has been provided regarding the importance of this vaccine. Patient has been advised to call insurance company to determine out of pocket expense if they have not yet received this vaccine. Advised may also receive vaccine at local pharmacy or Health Dept. Verbalized acceptance and understanding.  Screening Tests Health Maintenance  Topic Date Due   Colonoscopy  Never done   Zoster Vaccines- Shingrix (1 of 2) Never done   DTaP/Tdap/Td (2 - Tdap) 08/10/2016   INFLUENZA VACCINE  02/08/2023   COVID-19 Vaccine (5 - 2023-24 season) 03/11/2023   MAMMOGRAM  08/30/2023   DEXA SCAN  08/30/2023   Medicare Annual Wellness (AWV)  05/07/2024   Pneumonia Vaccine 4+ Years old  Completed   Hepatitis C Screening  Completed   HPV VACCINES  Aged Out    Health Maintenance  Health Maintenance Due  Topic Date Due   Colonoscopy  Never done   Zoster Vaccines- Shingrix (1 of 2) Never done   DTaP/Tdap/Td (2 - Tdap) 08/10/2016   INFLUENZA VACCINE  02/08/2023   COVID-19 Vaccine (5 - 2023-24 season)  03/11/2023    Colorectal cancer screening: Referral to GI placed done. Pt aware the office will call re: appt.  Mammogram status: Ordered today. Pt provided with contact info and advised to call to schedule appt.   Bone Density status: Completed 08/2021. Results reflect: Bone density results: OSTEOPENIA. Repeat every 2 years.  Lung Cancer Screening: (Low Dose CT Chest recommended if Age 97-80 years, 59  pack-year currently smoking OR have quit w/in 15years.) does not qualify.   Lung Cancer Screening Referral: na Additional Screening:  Hepatitis C Screening: does qualify; Completed   Vision Screening: Recommended annual ophthalmology exams for early detection of glaucoma and other disorders of the eye. Is the patient up to date with their annual eye exam?  Yes  Who is the provider or what is the name of the office in which the patient [facility]ends [facility] [address]  If pt is not established with a provider, would they like to be referred to a provider to establish care? No .   Dental Screening: Recommended annual dental exams for proper oral hygiene   Community Resource Referral / Chronic Care Management: CRR required this visit?  No   CCM required this visit?  No     Plan:     I have personally reviewed and noted the following in the patient's chart:   Medical and social history Use of alcohol, tobacco or illicit drugs  Current medications and supplements including opioid prescriptions. Patient is not currently taking opioid prescriptions. Functional ability and status Nutritional status Physical activity Advanced directives List of other physicians Hospitalizations, surgeries, and ER visits in previous 12 months Vitals Screenings to include cognitive, depression, and falls Referrals and appointments  In addition, I have reviewed and discussed with patient certain preventive protocols, quality metrics, and best practice recommendations. A written  personalized care plan for preventive services as well as general preventive health recommendations were provided to patient.     [name], NP   05/08/2023   After Visit Summary: (MyChart) Due to this being a telephonic visit, the after visit summary with patients personalized plan was offered to patient via MyChart

## 2023-05-08 NOTE — Patient Instructions (Signed)
  [name] , Thank you for taking time to come for your Medicare Wellness Visit. I appreciate your ongoing commitment to your health goals. Please review the following plan we discussed and let me know if I can assist you in the future.   This is a list of the screening recommended for you and due dates:  Health Maintenance  Topic Date Due   Colon Cancer Screening  Never done   Zoster (Shingles) Vaccine (1 of 2) Never done   DTaP/Tdap/Td vaccine (2 - Tdap) 08/10/2016   Flu Shot  02/08/2023   COVID-19 Vaccine (5 - 2023-24 season) 03/11/2023   Mammogram  08/30/2023   DEXA scan (bone density measurement)  08/30/2023   Medicare Annual Wellness Visit  05/07/2024   Pneumonia Vaccine  Completed   Hepatitis C Screening  Completed   HPV Vaccine  Aged Out    Make sure to follow up on colonoscopy Schedule mammogram  Get vaccines at local pharmacy

## 2023-05-08 NOTE — Progress Notes (Signed)
  This service is provided via telemedicine  No vital signs collected/recorded due to the encounter was a telemedicine visit.   Location of patient (ex: home, work):  Home  Patient consents to a telephone visit:  Yes  Location of the provider (ex: office, home):  Office [place].   Name of any referring provider:  na  Names of all persons participating in the telemedicine service and their role in the encounter:  Chryl Heck, Patient, Nelda Severe, CMA, Abbey Chatters, NP  Time spent on call:  7:11

## 2023-05-08 NOTE — Telephone Encounter (Signed)
Ms. jalaia, huguley are scheduled for a virtual visit with your provider today.    Just as we do with appointments in the office, we must obtain your consent to participate.  Your consent will be active for this visit and any virtual visit you Shayaan Parke have with one of our providers in the next 365 days.    If you have a MyChart account, I can also send a copy of this consent to you electronically.  All virtual visits are billed to your insurance company just like a traditional visit in the office.  As this is a virtual visit, video technology does not allow for your provider to perform a traditional examination.  This Julliana Whitmyer limit your provider's ability to fully assess your condition.  If your provider identifies any concerns that need to be evaluated in person or the need to arrange testing such as labs, EKG, etc, we will make arrangements to do so.    Although advances in technology are sophisticated, we cannot ensure that it will always work on either your end or our end.  If the connection with a video visit is poor, we Travares Nelles have to switch to a telephone visit.  With either a video or telephone visit, we are not always able to ensure that we have a secure connection.   I need to obtain your verbal consent now.   Are you willing to proceed with your visit today?   JAHNYLA MOOREHOUSE has provided verbal consent on 05/08/2023 for a virtual visit (video or telephone).   MayBeckey Downing, [place] 05/08/2023  9:27 AM

## 2023-05-12 ENCOUNTER — Ambulatory Visit
Admission: RE | Admit: 2023-05-12 | Discharge: 2023-05-12 | Disposition: A | Discharge status: Home / Self Care | Payer: Medicare HMO | Source: Ambulatory Visit | Attending: Physician Assistant | Admitting: Physician Assistant

## 2023-05-12 DIAGNOSIS — M545 Other chronic pain: Secondary | ICD-10-CM

## 2023-05-28 ENCOUNTER — Encounter: Payer: Self-pay | Admitting: Physical Medicine and Rehabilitation

## 2023-05-28 ENCOUNTER — Other Ambulatory Visit: Payer: Self-pay

## 2023-05-28 ENCOUNTER — Ambulatory Visit: Payer: Medicare HMO | Admitting: Physical Medicine and Rehabilitation

## 2023-05-28 DIAGNOSIS — G8929 Other chronic pain: Secondary | ICD-10-CM

## 2023-05-28 DIAGNOSIS — M47816 Spondylosis without myelopathy or radiculopathy, lumbar region: Secondary | ICD-10-CM

## 2023-05-28 DIAGNOSIS — M47819 Spondylosis without myelopathy or radiculopathy, site unspecified: Secondary | ICD-10-CM

## 2023-05-28 DIAGNOSIS — M545 Low back pain, unspecified: Secondary | ICD-10-CM | POA: Diagnosis not present

## 2023-05-28 NOTE — Progress Notes (Signed)
Referral entered  

## 2023-05-28 NOTE — Progress Notes (Unsigned)
[name] - 67 y.o. female MRN [id number]  Date of birth: 29-May-1956  Office Visit Note: Visit Date: 05/28/2023 PCP: [name], NP Referred by: [name], NP  Subjective: Chief Complaint  Patient presents with   Lower Back - Pain   HPI: [name] is a 66 y.o. female who comes in today per the request of Marval Regal, [place] for evaluation of chronic, worsening and severe bilateral lower back pain, ongoing for several months. Her pain worsens with standing and bending, describes her pain as sore and aching sensation, currently rates as 10 out of 10. Some relief of pain with home exercise regimen, rest and use of medications. No relief of pain with Flexeril. History of formal physical therapy in the past, states she was unable to tolerate treatments. Recent lumbar MRI imaging exhibits facet arthropathy at L3-L4 and L4-L5, worse on the right. No significant nerve impingement, no severe spinal canal stenosis noted. No history of lumbar surgery/lumbar injections. Patient working as Arboriculturist, states her severe pain is causing difficulty performing job duties. She is managed by Dr. Glee Arvin from orthopedic standpoint. Patient denies focal weakness, numbness and tingling. No recent trauma or falls.      Review of Systems  Musculoskeletal:  Positive for back pain.  Neurological:  Negative for tingling, sensory change, focal weakness and weakness.  All other systems reviewed and are negative.  Otherwise per HPI.  Assessment & Plan: Visit Diagnoses:    ICD-10-CM   1. Chronic bilateral low back pain without sciatica  M54.50 Ambulatory referral to Physical Medicine Rehab   G89.29     2. Spondylosis without myelopathy or radiculopathy  M47.819 Ambulatory referral to Physical Medicine Rehab    3. Facet arthropathy, lumbar  M47.816 Ambulatory referral to Physical Medicine Rehab       Plan: Findings:  Chronic, worsening and severe bilateral axial back pain. Patient  continues to have severe pain despite good conservative therapies such as formal physical therapy, home exercise regimen, rest and use of medications. I discussed recent lumbar MRI with patient today using imaging and spine model. Patients clinical presentation and exam are consistent with facet mediated pain, most prominent facet arthropathy noted at L4-L5. Pain noted with lumbar extension upon exam today. Next step is to perform diagnostic and hopefully therapeutic bilateral L4-L5 facet joint injections under fluoroscopic guidance. If good relief of pain with facet blocks we discussed possibility of longer sustained pain relief with radiofrequency ablation. I discussed injection procedure in detail with her today, she has no questions at this time. I encouraged patient to remain active as tolerated. No red flag symptoms noted upon exam today.   Screening for Osteoporosis for Women Aged 30-20 Years of Age:    Patient has had a central dual-energy X-ray absorptiometry (DXA) to check for osteoporosis.   Today note sent to PCP on record. Patient does not need referral to [facility] osteoporosis clinic.      Meds & Orders: No orders of the defined types were placed in this encounter.   Orders Placed This Encounter  Procedures   Ambulatory referral to Physical Medicine Rehab    Follow-up: Return for Bilateral L4-L5 facet joint injections.   Procedures: No procedures performed      Clinical History: Narrative & Impression CLINICAL DATA:  Left-sided low back pain over the last several months.   EXAM: MRI LUMBAR SPINE WITHOUT CONTRAST   TECHNIQUE: Multiplanar, multisequence MR imaging of the lumbar spine was performed. No  intravenous contrast was administered.   COMPARISON:  Radiography 11/02/2022   FINDINGS: Segmentation:  5 lumbar type vertebral bodies.   Alignment:  Normal   Vertebrae: No fracture or focal bone lesion. Edematous change of the facet joints on the right at L3-4 and  L4-5.   Conus medullaris and cauda equina: Conus extends to the L1 level. Conus and cauda equina appear normal.   Paraspinal and other soft tissues: Negative   Disc levels:   No abnormality from T12-L1 through L2-3.   L3-4: No disc abnormality. 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 No encroachment upon the neural structures. This could relate to back pain or referred facet syndrome pain.   L4-5: Minimal disc bulge. Bilateral facet osteoarthritis, worse on the right. No apparent compressive stenosis. Findings could relate to back pain or referred facet syndrome pain.   L5-S1: No disc abnormality. Minimal facet hypertrophy without visible edema. No stenosis.   IMPRESSION: 1. 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 No encroachment upon the neural structures. This could relate to back pain or referred facet syndrome pain. Mild bulging of the L4-5 disc.     Electronically Signed   By: Paulina Fusi M.D.   On: 05/28/2023 10:58   She reports that she has never smoked. She has never used smokeless tobacco. No results for input(s): "HGBA1C", "LABURIC" in the last 8760 hours.  Objective:  VS:  HT:    WT:   BMI:     BP:   HR: bpm  TEMP: ( )  RESP:  Physical Exam Vitals and nursing note reviewed.  HENT:     Head: Normocephalic and atraumatic.     Right Ear: External ear normal.     Left Ear: External ear normal.     Nose: Nose normal.     Mouth/Throat:     Mouth: Mucous membranes are moist.  Eyes:     Extraocular Movements: Extraocular movements intact.  Cardiovascular:     Rate and Rhythm: Normal rate.     Pulses: Normal pulses.  Pulmonary:     Effort: Pulmonary effort is normal.  Abdominal:     General: Abdomen is flat. There is no distension.  Musculoskeletal:        General: Tenderness present.     Cervical back: Normal range of motion.     Comments: Patient rises from seated position to standing without difficulty. Pain noted with facet loading  and lumbar extension. 5/5 strength noted with bilateral hip flexion, knee flexion/extension, ankle dorsiflexion/plantarflexion and EHL. No clonus noted bilaterally. No pain upon palpation of greater trochanters. No pain with internal/external rotation of bilateral hips. Sensation intact bilaterally. Negative slump test bilaterally. Ambulates without aid, gait steady.     Skin:    General: Skin is warm and dry.     Capillary Refill: Capillary refill takes less than 2 seconds.  Neurological:     General: No focal deficit present.     Mental Status: She is alert and oriented to person, place, and time.  Psychiatric:        Mood and Affect: Mood normal.        Behavior: Behavior normal.     Ortho Exam  Imaging: No results found.  Past Medical/Family/Surgical/Social History: Medications & Allergies reviewed per EMR, new medications updated. Patient Active Problem List   Diagnosis Date Noted   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   Primary osteoarthritis of right hip 03/24/2021   Trigger finger, acquired  08/12/2009   Anxiety state 05/20/2009   CANDIDIASIS, SKIN 03/12/2009   TINGLING 12/24/2008   CHEST PAIN 02/03/2008   HYPOKALEMIA 11/07/2007   Other specified disorders of bladder 09/26/2007   Headache 08/07/2007   Constipation 07/23/2007   MICROSCOPIC HEMATURIA 07/23/2007   Dental caries 05/27/2007   TINEA PEDIS 05/23/2007   Allergic rhinitis 05/23/2007   GERD 05/23/2007   Diaphragmatic hernia 05/23/2007   DEGENERATIVE JOINT DISEASE, LEFT KNEE 05/23/2007   ESSENTIAL HYPERTENSION, BENIGN 05/20/2007   Past Medical History:  Diagnosis Date   Acid reflux    [facility]   Alcohol use    Per Records received from [facility]- pt states she only has a [employer] of wine occasionally   Arthritis    Knee and hip   BMI 36.0-36.9,adult    Per Records received from [facility]   Chronic knee pain    L & R   GERD  (gastroesophageal reflux disease)    Heart murmur, systolic 09/19/2021   1/6 systolic   Hyperlipidemia    Hypertension    Morbid obesity (HCC)    Per Records received from [facility]   Other osteoarthritis involving multiple joints    Per Records received from [employer]   Prediabetes    Per Records received from [facility]   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[facility])    Per records from [employer] [facility]   Right hip pain    Per records from [employer] [facility]   Right thigh pain 2022   Per records from [employer] [facility] [place]   Vitamin D deficiency    Per Records received from [facility]   Family History  Problem Relation Age of Onset   Hypertension Mother        [facility] [place]   Cancer Mother 28       colon   Colon cancer Mother        [facility] [place]   Cancer Father 72       lung   Hypertension Father        [facility] records [facility] [place]   Lung disease Father        Per records from [employer] [facility]   Seizures Brother    Epilepsy Brother    Drug abuse Daughter    Other Daughter        Murder in 2014   Stomach cancer Neg Hx    Rectal cancer Neg Hx    Esophageal cancer Neg Hx    Past Surgical History:  Procedure Laterality Date   COLONOSCOPY     JOINT REPLACEMENT Left 07/11/2018   knee replacement   TOE SURGERY Right    bone spur "shaved" down- many years ago   TOTAL HIP ARTHROPLASTY Right 09/21/2021   Procedure: RIGHT TOTAL HIP ARTHROPLASTY ANTERIOR APPROACH;  Surgeon: Tarry Kos, MD;  Location: [facility] OR;  Service: Orthopedics;  Laterality: Right;   TUBAL LIGATION  1978   [facility]   Social History   Occupational History   Not on file  Tobacco Use   Smoking status: Never   Smokeless tobacco: Never  Vaping Use   Vaping status: Never Used   Substance and Sexual Activity   Alcohol use: Yes  Comment: pt states she may have 1 Panos of wine on special occasions   Drug use: No   Sexual activity: Not on file

## 2023-05-28 NOTE — Progress Notes (Signed)
F/u with [name] to discuss

## 2023-05-29 ENCOUNTER — Other Ambulatory Visit: Payer: Self-pay

## 2023-06-04 ENCOUNTER — Ambulatory Visit (INDEPENDENT_AMBULATORY_CARE_PROVIDER_SITE_OTHER): Payer: Medicare HMO | Admitting: Nurse Practitioner

## 2023-06-04 VITALS — BP 140/90 | HR 66 | Temp 97.2°F | Ht 59.0 in | Wt 161.0 lb

## 2023-06-04 DIAGNOSIS — E782 Mixed hyperlipidemia: Secondary | ICD-10-CM | POA: Diagnosis not present

## 2023-06-04 DIAGNOSIS — R739 Hyperglycemia, unspecified: Secondary | ICD-10-CM

## 2023-06-04 DIAGNOSIS — M8580 Other specified disorders of bone density and structure, unspecified site: Secondary | ICD-10-CM

## 2023-06-04 DIAGNOSIS — K219 Gastro-esophageal reflux disease without esophagitis: Secondary | ICD-10-CM

## 2023-06-04 DIAGNOSIS — G8929 Other chronic pain: Secondary | ICD-10-CM | POA: Diagnosis not present

## 2023-06-04 DIAGNOSIS — M5416 Radiculopathy, lumbar region: Secondary | ICD-10-CM | POA: Diagnosis not present

## 2023-06-04 DIAGNOSIS — I10 Essential (primary) hypertension: Secondary | ICD-10-CM | POA: Diagnosis not present

## 2023-06-04 DIAGNOSIS — Z7689 Persons encountering health services in other specified circumstances: Secondary | ICD-10-CM

## 2023-06-04 DIAGNOSIS — Z1212 Encounter for screening for malignant neoplasm of rectum: Secondary | ICD-10-CM

## 2023-06-04 DIAGNOSIS — Z1211 Encounter for screening for malignant neoplasm of colon: Secondary | ICD-10-CM

## 2023-06-04 NOTE — Progress Notes (Signed)
Careteam: Patient Care Team: [name], NP as PCP - General (Geriatric Medicine)  PLACE OF SERVICE:  [facility]  Advanced Directive information    No Known Allergies  Chief Complaint  Patient presents with   Medical Management of Chronic Issues    Routine visit.    Immunizations    Discuss the need for 2nd Shingrix vaccine, DTAP vaccine, Influenza vaccine, and Covid Booster. NCIR Verified.    Health Maintenance    Discuss the need for Colonoscopy.      HPI: Patient is a 67 y.o. female for routine follow up.   Reports her back pain is arthritis and she feels better about it.  Saw orthopedic about her back recommended injection and to use tylenol   She has been out of work since her gallbladder but going back to work Quarry manager.  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  No ongoing abdominal pain. Moving bowels well.   Has not taken her blood pressure medication today.   GERD- controlled on protonix.     Review of Systems:  Review of Systems  Constitutional:  Negative for chills, fever and weight loss.  HENT:  Negative for tinnitus.   Respiratory:  Negative for cough, sputum production and shortness of breath.   Cardiovascular:  Negative for chest pain, palpitations and leg swelling.  Gastrointestinal:  Positive for heartburn. Negative for abdominal pain, constipation and diarrhea.  Genitourinary:  Negative for dysuria, frequency and urgency.  Musculoskeletal:  Positive for back pain. Negative for falls, joint pain and myalgias.  Skin: Negative.   Neurological:  Negative for dizziness and headaches.  Psychiatric/Behavioral:  Negative for depression and memory loss. The patient does not have insomnia.     Past Medical History:  Diagnosis Date   Acid reflux    [facility]   Alcohol use    Per Records received from [facility]- pt states she only has a [place] of wine  occasionally   Arthritis    Knee and hip   BMI 36.0-36.9,adult    Per Records received from [facility]   Chronic knee pain    L & R   GERD (gastroesophageal reflux disease)    Heart murmur, systolic 09/19/2021   1/6 systolic   Hyperlipidemia    Hypertension    Morbid obesity (HCC)    Per Records received from [facility]   Other osteoarthritis involving multiple joints    Per Records received from [employer]   Prediabetes    Per Records received from [facility]   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[facility])    Per records from [employer] [facility]   Right hip pain    Per records from [employer] [facility]   Right thigh pain 2022   Per records from [employer] [facility]   Vitamin D deficiency    Per Records received from [facility]   Past Surgical History:  Procedure Laterality Date   COLONOSCOPY     JOINT REPLACEMENT Left 07/11/2018   knee replacement   TOE SURGERY Right    bone spur "shaved" down- many years ago   TOTAL HIP ARTHROPLASTY Right 09/21/2021   Procedure: RIGHT TOTAL HIP ARTHROPLASTY ANTERIOR APPROACH;  Surgeon: Tarry Kos, MD;  Location: [facility] OR;  Service: Orthopedics;  Laterality: Right;   TUBAL LIGATION  1978   Per records [facility]  [facility]   Social History:   reports that she has never smoked. She has never used smokeless tobacco. She reports current alcohol use. She reports that she does not use drugs.  Family History  Problem Relation Age of Onset   Hypertension Mother        [facility]   Cancer Mother 88       colon   Colon cancer Mother        [facility] [place]   Cancer Father 72       lung   Hypertension Father        [facility]   Lung disease Father        Per records from [employer] [facility]   Seizures Brother    Epilepsy Brother     Drug abuse Daughter    Other Daughter        Murder in 2014   Stomach cancer Neg Hx    Rectal cancer Neg Hx    Esophageal cancer Neg Hx     Medications: Patient's Medications  New Prescriptions   No medications on file  Previous Medications   CALCIUM CARBONATE (CALCIUM 600) 600 MG TABS TABLET    Take 1 tablet (600 mg total) by mouth 2 (two) times daily with a meal.   CHOLECALCIFEROL (VITAMIN D3) 50 MCG (2000 UT) CAPSULE    Take 1 capsule (2,000 Units total) by mouth daily.   LISINOPRIL-HYDROCHLOROTHIAZIDE (ZESTORETIC) 20-12.5 MG TABLET    Take 1 tablet by mouth once daily   MULTIPLE VITAMIN (MULTIVITAMIN WITH MINERALS) TABS TABLET    Take 1 tablet by mouth daily.   PANTOPRAZOLE (PROTONIX) 20 MG TABLET    Take 1 tablet (20 mg total) by mouth daily.   ROSUVASTATIN (CRESTOR) 20 MG TABLET    Take 1 tablet (20 mg total) by mouth daily.  Modified Medications   No medications on file  Discontinued Medications   CYCLOBENZAPRINE (FLEXERIL) 5 MG TABLET    Take 1 tablet (5 mg total) by mouth 3 (three) times daily as needed for muscle spasms.    Physical Exam:  Vitals:   06/04/23 0959 06/04/23 1028  BP: (!) 156/80 (!) 140/90  Pulse: 66   Temp: (!) 97.2 F (36.2 C)   SpO2: 98%   Weight: 161 lb (73 kg)   Height: 4\' 11"  (1.499 m)    Body mass index is 32.52 kg/m. Wt Readings from Last 3 Encounters:  06/04/23 161 lb (73 kg)  04/13/23 158 lb 3.2 oz (71.8 kg)  11/02/22 156 lb 3.2 oz (70.9 kg)    Physical Exam Constitutional:      General: She is not in acute distress.    Appearance: She is well-developed. She is not diaphoretic.  HENT:     Head: Normocephalic and atraumatic.     Mouth/Throat:     Pharynx: No oropharyngeal exudate.  Eyes:     Conjunctiva/sclera: Conjunctivae normal.     Pupils: Pupils are equal, round, and reactive to light.  Cardiovascular:     Rate and Rhythm: Normal rate and regular rhythm.     Heart sounds: Normal heart sounds.  Pulmonary:     Effort:  Pulmonary effort is normal.     Breath sounds: Normal breath sounds.  Abdominal:     General: Bowel sounds are normal.  Palpations: Abdomen is soft.  Musculoskeletal:     Cervical back: Normal range of motion and neck supple.     Right lower leg: No edema.     Left lower leg: No edema.  Skin:    General: Skin is warm and dry.  Neurological:     Mental Status: She is alert.  Psychiatric:        Mood and Affect: Mood normal.     Labs reviewed: Basic Metabolic Panel: Recent Labs    10/12/22 0558 10/22/22 0602  NA 136 138  K 3.5 3.5  CL 104 102  CO2 27 27  GLUCOSE 95 116*  BUN 14 13  CREATININE 0.61 0.81  CALCIUM 9.2 9.7   Liver Function Tests: Recent Labs    10/22/22 0602  AST 27  ALT 17  ALKPHOS 57  BILITOT 0.9  PROT 7.1  ALBUMIN 3.8   Recent Labs    10/22/22 0602  LIPASE 27   No results for input(s): "AMMONIA" in the last 8760 hours. CBC: Recent Labs    10/12/22 0558 10/22/22 0602  WBC 7.2 7.2  NEUTROABS 2.8 3.5  HGB 12.6 12.7  HCT 37.4 39.1  MCV 92.3 94.2  PLT 222 263   Lipid Panel: No results for input(s): "CHOL", "HDL", "LDLCALC", "TRIG", "CHOLHDL", "LDLDIRECT" in the last 8760 hours. TSH: No results for input(s): "TSH" in the last 8760 hours. A1C: Lab Results  Component Value Date   HGBA1C 6.2 (H) 05/22/2022     Assessment/Plan 1. Chronic radicular pain of lower back -stable at this time -continues to follow up with orthopedics -tylenol 500 mg 2 tablets every 8 hours as needed pain  2. Essential hypertension, benign --Blood pressure elevated today but typically well controlled -home blood pressures are well controlled -No changes to medications today  -will have pt continue to monitor home bp goal <140/90, to notify if readings remain high on 3 different days  -follow metabolic panel - COMPLETE METABOLIC PANEL WITH GFR - CBC with Differential/Platelet  3. Gastroesophageal reflux disease without esophagitis Stable on  protonix  4. Mixed hyperlipidemia -continues on crestor and encourage dietary modifications - Lipid panel - COMPLETE METABOLIC PANEL WITH GFR  5. Hyperglycemia -dietary modifications encouraged - Hemoglobin A1c  6. Encounter for colorectal cancer screening - Ambulatory referral to Gastroenterology  7. Osteopenia, unspecified location -Recommended to take calcium 600 mg twice daily with Vitamin D 2000 units daily and weight bearing activity 30 mins/5 days a week  8. Need for referral to dentistry for poor dentition - Ambulatory referral to Dentistry She is wanting dentures as well.   Return in about 6 months (around 12/02/2023) for routine follow up.  Janene Harvey. Biagio Borg [facility] [phone]

## 2023-06-05 LAB — CBC WITH DIFFERENTIAL/PLATELET
Absolute Lymphocytes: 2919 {cells}/uL (ref 850–3900)
Absolute Monocytes: 428 {cells}/uL (ref 200–950)
Basophils Absolute: 41 {cells}/uL (ref 0–200)
Basophils Relative: 0.6 %
Eosinophils Absolute: 435 {cells}/uL (ref 15–500)
Eosinophils Relative: 6.3 %
HCT: 39.5 % (ref 35.0–45.0)
Hemoglobin: 13 g/dL (ref 11.7–15.5)
MCH: 29.4 pg (ref 27.0–33.0)
MCHC: 32.9 g/dL (ref 32.0–36.0)
MCV: 89.4 fL (ref 80.0–100.0)
MPV: 12.1 fL (ref 7.5–12.5)
Monocytes Relative: 6.2 %
Neutro Abs: 3077 {cells}/uL (ref 1500–7800)
Neutrophils Relative %: 44.6 %
Platelets: 269 10*3/uL (ref 140–400)
RBC: 4.42 10*6/uL (ref 3.80–5.10)
RDW: 12 % (ref 11.0–15.0)
Total Lymphocyte: 42.3 %
WBC: 6.9 10*3/uL (ref 3.8–10.8)

## 2023-06-05 LAB — LIPID PANEL
Cholesterol: 162 mg/dL (ref <200)
HDL: 59 mg/dL (ref >=50)
LDL Cholesterol (Calc): 79 mg/dL
Non-HDL Cholesterol (Calc): 103 mg/dL (ref <130)
Total CHOL/HDL Ratio: 2.7 (calc) (ref <5.0)
Triglycerides: 140 mg/dL (ref <150)

## 2023-06-05 LAB — COMPLETE METABOLIC PANEL WITH GFR
AG Ratio: 1.2 (calc) (ref 1.0–2.5)
ALT: 12 U/L (ref 6–29)
AST: 18 U/L (ref 10–35)
Albumin: 4 g/dL (ref 3.6–5.1)
Alkaline phosphatase (APISO): 77 U/L (ref 37–153)
BUN: 15 mg/dL (ref 7–25)
CO2: 25 mmol/L (ref 20–32)
Calcium: 10.1 mg/dL (ref 8.6–10.4)
Chloride: 107 mmol/L (ref 98–110)
Creat: 0.77 mg/dL (ref 0.50–1.05)
Globulin: 3.4 g/dL (ref 1.9–3.7)
Glucose, Bld: 97 mg/dL (ref 65–99)
Potassium: 4.2 mmol/L (ref 3.5–5.3)
Sodium: 140 mmol/L (ref 135–146)
Total Bilirubin: 0.3 mg/dL (ref 0.2–1.2)
Total Protein: 7.4 g/dL (ref 6.1–8.1)
eGFR: 84 mL/min/{1.73_m2} (ref >=60)

## 2023-06-05 LAB — HEMOGLOBIN A1C
Hgb A1c MFr Bld: 6.3 %{Hb} — ABNORMAL HIGH (ref <5.7)
Mean Plasma Glucose: 134 mg/dL
eAG (mmol/L): 7.4 mmol/L

## 2023-06-06 ENCOUNTER — Ambulatory Visit
Admission: RE | Admit: 2023-06-06 | Discharge: 2023-06-06 | Disposition: A | Discharge status: Home / Self Care | Payer: Medicare HMO | Source: Ambulatory Visit | Attending: Nurse Practitioner | Admitting: Nurse Practitioner

## 2023-06-06 DIAGNOSIS — Z1231 Encounter for screening mammogram for malignant neoplasm of breast: Secondary | ICD-10-CM

## 2023-06-11 ENCOUNTER — Other Ambulatory Visit: Payer: Self-pay

## 2023-06-11 ENCOUNTER — Ambulatory Visit (INDEPENDENT_AMBULATORY_CARE_PROVIDER_SITE_OTHER): Payer: Medicare HMO | Admitting: Physical Medicine and Rehabilitation

## 2023-06-11 DIAGNOSIS — M47819 Spondylosis without myelopathy or radiculopathy, site unspecified: Secondary | ICD-10-CM

## 2023-06-11 DIAGNOSIS — M10372 Gout due to renal impairment, left ankle and foot: Secondary | ICD-10-CM | POA: Insufficient documentation

## 2023-06-11 MED ORDER — METHYLPREDNISOLONE ACETATE 40 MG/ML IJ SUSP
40.0000 mg | Freq: Once | INTRAMUSCULAR | Status: AC
  Administered 2023-06-11: 40 mg

## 2023-06-11 NOTE — Progress Notes (Signed)
Functional Pain Scale - descriptive words and definitions  Moderate (4)   Constantly aware of pain, can complete ADLs with modification/sleep marginally affected at times/passive distraction is of no use, but active distraction gives some relief. Moderate range order  Average Pain 4 172/80 She works 3rd shift and is in a little pain this am   +Driver, -BT, -Dye Allergies.

## 2023-06-11 NOTE — Procedures (Signed)
Lumbar Facet Joint Intra-Articular Injection(s) with Fluoroscopic Guidance  Patient: [name]      Date of Birth: [birth year] MRN: [id number] PCP: [name], NP      Visit Date: 06/11/2023   Universal Protocol:    Date/Time: 06/11/2023  Consent Given By: the patient  Position: PRONE   Additional Comments: Vital signs were monitored before and after the procedure. Patient was prepped and draped in the usual sterile fashion. The correct patient, procedure, and site was verified.   Injection Procedure Details:  Procedure Site One Meds Administered:  Meds ordered this encounter  Medications   methylPREDNISolone acetate (DEPO-MEDROL) injection 40 mg     Laterality: Bilateral  Location/Site:  L4-L5  Needle size: 22 guage  Needle type: Spinal  Needle Placement: Articular  Findings:  -Comments: Excellent flow of contrast producing a partial arthrogram.  Procedure Details: The fluoroscope beam is vertically oriented in AP, and the inferior recess is visualized beneath the lower pole of the inferior apophyseal process, which represents the target point for needle insertion. When direct visualization is difficult the target point is located at the medial projection of the vertebral pedicle. The region overlying each aforementioned target is locally anesthetized with a 1 to 2 ml. volume of 1% Lidocaine without Epinephrine.   The spinal needle was inserted into each of the above mentioned facet joints using biplanar fluoroscopic guidance. A 0.25 to 0.5 ml. volume of Isovue-250 was injected and a partial facet joint arthrogram was obtained. A single spot film was obtained of the resulting arthrogram.    One to 1.25 ml of the steroid/anesthetic solution was then injected into each of the facet joints noted above.   Additional Comments:  The patient tolerated the procedure well Dressing: 2 x 2 sterile gauze and Band-Aid    Post-procedure details: Patient was observed  during the procedure. Post-procedure instructions were reviewed.  Patient left the clinic in stable condition.

## 2023-06-11 NOTE — Patient Instructions (Signed)

## 2023-06-11 NOTE — Progress Notes (Signed)
[name] - 67 y.o. female MRN [id number]  Date of birth: [birth year]  Office Visit Note: Visit Date: 06/11/2023 PCP: [name], NP Referred by: [name], NP  Subjective: Chief Complaint  Patient presents with   Lower Back - Pain   HPI:  DEARDRA Valdez is a 67 y.o. female who comes in today at the request of Ellin Goodie, FNP for planned Bilateral  L4-5 Lumbar facet/medial branch block with fluoroscopic guidance.  The patient has failed conservative care including home exercise, medications, time and activity modification.  This injection will be diagnostic and hopefully therapeutic.  Please see requesting physician notes for further details and justification.  Exam has shown concordant pain with facet joint loading.   ROS Otherwise per HPI.  Assessment & Plan: Visit Diagnoses:    ICD-10-CM   1. 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   Meds & Orders:  Meds ordered this encounter  Medications   methylPREDNISolone acetate (DEPO-MEDROL) injection 40 mg    Orders Placed This Encounter  Procedures   Facet Injection   XR C-ARM NO REPORT    Follow-up: Return for visit to requesting provider as needed.   Procedures: No procedures performed  Lumbar Facet Joint Intra-Articular Injection(s) with Fluoroscopic Guidance  Patient: [name]      Date of Birth: 07/05/1956 MRN: [id number] PCP: [name], NP      Visit Date: 06/11/2023   Universal Protocol:    Date/Time: 06/11/2023  Consent Given By: the patient  Position: PRONE   Additional Comments: Vital signs were monitored before and after the procedure. Patient was prepped and draped in the usual sterile fashion. The correct patient, procedure, and site was verified.   Injection Procedure Details:  Procedure Site One Meds  Administered:  Meds ordered this encounter  Medications   methylPREDNISolone acetate (DEPO-MEDROL) injection 40 mg     Laterality: Bilateral  Location/Site:  L4-L5  Needle size: 22 guage  Needle type: Spinal  Needle Placement: Articular  Findings:  -Comments: Excellent flow of contrast producing a partial arthrogram.  Procedure Details: The fluoroscope beam is vertically oriented in AP, and the inferior recess is visualized beneath the lower pole of the inferior apophyseal process, which represents the target point for needle insertion. When direct visualization is difficult the target point is located at the medial projection of the vertebral pedicle. The region overlying each aforementioned target is locally anesthetized with a 1 to 2 ml. volume of 1% Lidocaine without Epinephrine.   The spinal needle was inserted into each of the above mentioned facet joints using biplanar fluoroscopic guidance. A 0.25 to 0.5 ml. volume of Isovue-250 was injected and a partial facet joint arthrogram was obtained. A single spot film was obtained of the resulting arthrogram.    One to 1.25 ml of the steroid/anesthetic solution was then injected into each of the facet joints noted above.   Additional Comments:  The patient tolerated the procedure well Dressing: 2 x 2 sterile gauze and Band-Aid    Post-procedure details: Patient was observed during the procedure. Post-procedure instructions were reviewed.  Patient left the clinic in stable condition.    Clinical History: Narrative & Impression CLINICAL DATA:  Left-sided low back pain over the last several months.   EXAM: MRI LUMBAR SPINE WITHOUT CONTRAST  TECHNIQUE: Multiplanar, multisequence MR imaging of the lumbar spine was performed. No intravenous contrast was administered.   COMPARISON:  Radiography 11/02/2022   FINDINGS: Segmentation:  5 lumbar type vertebral bodies.   Alignment:  Normal   Vertebrae: No fracture or  focal bone lesion. Edematous change of the facet joints on the right at L3-4 and L4-5.   Conus medullaris and cauda equina: Conus extends to the L1 level. Conus and cauda equina appear normal.   Paraspinal and other soft tissues: Negative   Disc levels:   No abnormality from T12-L1 through L2-3.   L3-4: No disc abnormality. 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 No encroachment upon the neural structures. This could relate to back pain or referred facet syndrome pain.   L4-5: Minimal disc bulge. Bilateral facet osteoarthritis, worse on the right. No apparent compressive stenosis. Findings could relate to back pain or referred facet syndrome pain.   L5-S1: No disc abnormality. Minimal facet hypertrophy without visible edema. No stenosis.   IMPRESSION: 1. 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 No encroachment upon the neural structures. This could relate to back pain or referred facet syndrome pain. Mild bulging of the L4-5 disc.     Electronically Signed   By: Paulina Fusi M.D.   On: 05/28/2023 10:58     Objective:  VS:  HT:    WT:   BMI:     BP:   HR: bpm  TEMP: ( )  RESP:  Physical Exam Vitals and nursing note reviewed.  Constitutional:      General: She is not in acute distress.    Appearance: Normal appearance. She is not ill-appearing.  HENT:     Head: Normocephalic and atraumatic.     Right Ear: External ear normal.     Left Ear: External ear normal.  Eyes:     Extraocular Movements: Extraocular movements intact.  Cardiovascular:     Rate and Rhythm: Normal rate.     Pulses: Normal pulses.  Pulmonary:     Effort: Pulmonary effort is normal. No respiratory distress.  Abdominal:     General: There is no distension.     Palpations: Abdomen is soft.  Musculoskeletal:        General: Tenderness present.     Cervical back: Neck supple.     Right lower leg: No edema.     Left lower leg: No edema.     Comments: Patient has good distal  strength with no pain over the greater trochanters.  No clonus or focal weakness.  Skin:    Findings: No erythema, lesion or rash.  Neurological:     General: No focal deficit present.     Mental Status: She is alert and oriented to person, place, and time.     Sensory: No sensory deficit.     Motor: No weakness or abnormal muscle tone.     Coordination: Coordination normal.  Psychiatric:        Mood and Affect: Mood normal.        Behavior: Behavior normal.      Imaging: XR C-ARM NO REPORT  Result Date: 06/11/2023 Please see Notes tab for imaging impression.

## 2023-07-06 ENCOUNTER — Telehealth: Payer: Self-pay | Admitting: *Deleted

## 2023-07-06 NOTE — Telephone Encounter (Signed)
Patient notified and agreed. Stated she will call the Dental Office and schedule an appointment.

## 2023-07-06 NOTE — Telephone Encounter (Signed)
Patient called and Left message on clinical intake and stated that Shanda Bumps placed a referral for a Dentist but she has not heard anything.  MyChart letter was sent to patient on 06/04/23.   Tried calling patient, LMOM to return call.

## 2023-07-13 ENCOUNTER — Telehealth: Payer: Self-pay | Admitting: *Deleted

## 2023-07-13 NOTE — Telephone Encounter (Signed)
 Attempted to contact patient x 2 for telephone PV. Received message that person was not accepting phone calls, no VM. Mailed missed appointment letter.

## 2023-07-23 ENCOUNTER — Encounter: Payer: Self-pay | Admitting: Gastroenterology

## 2023-07-23 ENCOUNTER — Other Ambulatory Visit: Payer: Self-pay | Admitting: Nurse Practitioner

## 2023-07-24 ENCOUNTER — Telehealth: Payer: Self-pay

## 2023-07-24 NOTE — Telephone Encounter (Signed)
 Patient call this morning stating that she has not her from [facility]  that was referred by [name], NP and would like an update. Please Advise   Message sent to [facility]

## 2023-07-25 NOTE — Telephone Encounter (Signed)
 I left a voicemail her and let her know that you called and to look at her MyChart message.  Thank you [employer] to [name]

## 2023-07-25 NOTE — Telephone Encounter (Signed)
 I have called the patient as well as sent her a MyChart message regarding the Dental Referral.

## 2023-07-31 ENCOUNTER — Encounter: Payer: Medicare HMO | Admitting: Gastroenterology

## 2023-08-22 ENCOUNTER — Emergency Department (HOSPITAL_COMMUNITY)
Admission: EM | Admit: 2023-08-22 | Discharge: 2023-08-22 | Disposition: A | Discharge status: Home / Self Care | Payer: Medicare HMO | Attending: Emergency Medicine | Admitting: Emergency Medicine

## 2023-08-22 ENCOUNTER — Encounter (HOSPITAL_COMMUNITY): Payer: Self-pay

## 2023-08-22 ENCOUNTER — Other Ambulatory Visit: Payer: Self-pay

## 2023-08-22 DIAGNOSIS — Y92410 Unspecified street and highway as the place of occurrence of the external cause: Secondary | ICD-10-CM | POA: Insufficient documentation

## 2023-08-22 DIAGNOSIS — R1032 Left lower quadrant pain: Secondary | ICD-10-CM | POA: Insufficient documentation
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
## 2023-08-30 ENCOUNTER — Ambulatory Visit (AMBULATORY_SURGERY_CENTER): Payer: Medicare HMO

## 2023-08-30 ENCOUNTER — Other Ambulatory Visit: Payer: Self-pay | Admitting: Gastroenterology

## 2023-08-30 VITALS — Ht 59.0 in | Wt 155.0 lb

## 2023-08-30 DIAGNOSIS — Z1211 Encounter for screening for malignant neoplasm of colon: Secondary | ICD-10-CM

## 2023-08-30 MED ORDER — SUFLAVE 178.7 G PO SOLR: 1.0000 | Freq: Once | ORAL | 0 refills | Status: AC

## 2023-08-30 NOTE — Progress Notes (Signed)

## 2023-09-03 ENCOUNTER — Ambulatory Visit (INDEPENDENT_AMBULATORY_CARE_PROVIDER_SITE_OTHER): Payer: Medicare HMO | Admitting: Family

## 2023-09-03 ENCOUNTER — Encounter: Payer: Self-pay | Admitting: Family

## 2023-09-03 VITALS — BP 138/88 | HR 80 | Temp 97.8°F | Resp 19 | Ht 59.0 in | Wt 161.2 lb

## 2023-09-03 DIAGNOSIS — L02215 Cutaneous abscess of perineum: Secondary | ICD-10-CM

## 2023-09-03 DIAGNOSIS — R52 Pain, unspecified: Secondary | ICD-10-CM | POA: Diagnosis not present

## 2023-09-03 MED ORDER — CEPHALEXIN 500 MG PO CAPS: 500.0000 mg | ORAL_CAPSULE | Freq: Two times a day (BID) | ORAL | 0 refills | Status: AC

## 2023-09-03 NOTE — Progress Notes (Signed)
 Provider: Heela Heishman FNP-C  [name], NP  Patient Care Team: [name], NP as PCP - General (Geriatric Medicine)  Extended Emergency Contact Information Primary Emergency Contact: CARTER,GILDA Mobile Phone: [phone] Relation: Sister Secondary Emergency Contact: Inis Sizer Address: [address]          [place], [place] [address] [place] Home Phone: [phone] Mobile Phone: [phone] Relation: Son  Code Status:  Full Code  Goals of care: Advanced Directive information    09/03/2023    9:27 AM  Advanced Directives  Does Patient Have a Medical Advance Directive? No  Would patient like information on creating a medical advance directive? No - Patient declined     Chief Complaint  Patient presents with   Acute Visit    Raised area.    Discussed the use of AI scribe software for clinical note transcription with the patient, who gave verbal consent to proceed.  History of Present Illness   [name] is a 68 year old female who presents with a painful bump that has since decreased in size.  She initially noticed a painful bump described as a 'little knot' that enlarged over time. The bump, located on her labia, decreased in size by the day before the visit. It was not draining and did not resemble a boil or pimple. A colleague suggested it might be similar to a condition her mother experienced, increasing her concern. No chills or drainage were noted. The bump is no longer painful and may have opened and drained on its own. It was described as 'big, like a pea size' and possibly related to an infected hair follicle.  She feels generally unwell with body aches and weakness, which prompted her to leave work early. No cough, back pain, or recent exposure to sick individuals, although her daughter mentioned the flu is prevalent in [place], where she recently visited while apartment hunting. She has not been  around anyone with the flu recently.  She recently moved to a new area near [employer] [place] [employer] and is in the process of settling into a new apartment.   Past Medical History:  Diagnosis Date   Acid reflux    [facility]   Alcohol use    Per Records received from [facility]- pt states she only has a Lukins of wine occasionally   Arthritis    Knee and hip   BMI 36.0-36.9,adult    Per Records received from [facility]   Chronic knee pain    L & R   GERD (gastroesophageal reflux disease)    Heart murmur, systolic 09/19/2021   1/6 systolic   Hyperlipidemia    Hypertension    Morbid obesity (HCC)    Per Records received from [facility]   Other osteoarthritis involving multiple joints    Per Records received from [facility]   Prediabetes    Per Records received from [facility]   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[facility])    Per records from [facility]   Right hip pain    Per records from [facility]   Right thigh pain 2022   Per records from [facility]   Vitamin D deficiency    Per Records received from [facility]   Past Surgical History:  Procedure Laterality Date   COLONOSCOPY  JOINT REPLACEMENT Left 07/11/2018   knee replacement   TOE SURGERY Right    bone spur "shaved" down- many years ago   TOTAL HIP ARTHROPLASTY Right 09/21/2021   Procedure: RIGHT TOTAL HIP ARTHROPLASTY ANTERIOR APPROACH;  Surgeon: Tarry Kos, MD;  Location: [facility] OR;  Service: Orthopedics;  Laterality: Right;   TUBAL LIGATION  1978   [facility] records [facility]    No Known Allergies  Outpatient Encounter Medications as of 09/03/2023  Medication Sig   Calcium Carb-Cholecalciferol (CALCIUM HIGH POTENCY/VITAMIN D) 600-5 MG-MCG TABS Take by mouth.   calcium carbonate (CALCIUM 600) 600 MG TABS tablet Take 1 tablet (600 mg total) by mouth 2 (two) times daily  with a meal.   cephALEXin (KEFLEX) 500 MG capsule Take 1 capsule (500 mg total) by mouth 2 (two) times daily for 7 days.   lisinopril-hydrochlorothiazide (ZESTORETIC) 20-12.5 MG tablet Take 1 tablet by mouth once daily   Multiple Vitamin (MULTIVITAMIN WITH MINERALS) TABS tablet Take 1 tablet by mouth daily.   pantoprazole (PROTONIX) 20 MG tablet Take 1 tablet by mouth once daily   rosuvastatin (CRESTOR) 20 MG tablet Take 1 tablet (20 mg total) by mouth daily.   Cholecalciferol (VITAMIN D3) 50 MCG (2000 UT) capsule Take 1 capsule (2,000 Units total) by mouth daily. (Patient not taking: Reported on 09/03/2023)   No facility-administered encounter medications on file as of 09/03/2023.    Review of Systems  Constitutional:  Negative for appetite change, chills, fatigue, fever and unexpected weight change.       Generalized body aches   HENT:  Negative for congestion, ear discharge, ear pain, hearing loss, nosebleeds, postnasal drip, rhinorrhea, sinus pressure, sinus pain, sneezing, sore throat, tinnitus and trouble swallowing.   Eyes:  Negative for pain, discharge, redness, itching and visual disturbance.  Respiratory:  Negative for cough, chest tightness, shortness of breath and wheezing.   Cardiovascular:  Negative for chest pain, palpitations and leg swelling.  Gastrointestinal:  Negative for abdominal distention, abdominal pain, blood in stool, constipation, diarrhea, nausea and vomiting.  Musculoskeletal:  Negative for arthralgias, back pain, gait problem, joint swelling and myalgias.  Skin:  Negative for color change, pallor, rash and wound.       Bumb on labia area has improved in size and pain   Neurological:  Negative for dizziness, syncope, speech difficulty, weakness, light-headedness, numbness and headaches.    Immunization History  Administered Date(s) Administered   Fluad Quad(high Dose 65+) 03/09/2021, 05/22/2022   Influenza Whole 04/19/2009   Influenza, High Dose Seasonal PF  05/23/2023   Influenza,inj,Quad PF,6+ Mos 05/17/2018   Moderna Covid-19 Fall Seasonal Vaccine 12yrs & older 05/23/2023   Moderna SARS-COV2 Booster Vaccination 01/14/2021   Moderna Sars-Covid-2 Vaccination 04/16/2020, 05/14/2020   PNEUMOCOCCAL CONJUGATE-20 04/25/2021   Pfizer Covid-19 Vaccine Bivalent Booster 68yrs & up 05/28/2021   Td 08/10/2006   Zoster Recombinant(Shingrix) 11/19/2015   Pertinent  Health Maintenance Due  Topic Date Due   Colonoscopy  Never done   DEXA SCAN  08/30/2023   MAMMOGRAM  06/05/2025   INFLUENZA VACCINE  Completed      11/02/2022    8:52 AM 01/24/2023    2:58 PM 04/13/2023    8:53 AM 05/08/2023    9:22 AM 09/03/2023    9:27 AM  Fall Risk  Falls in the past year? 0 0 0 0 0  Was there an injury with Fall? 0 0  0 0  Fall Risk Category Calculator  0 0  0 0  Patient at Risk for Falls Due to No Fall Risks No Fall Risks     Fall risk Follow up Falls evaluation completed Falls evaluation completed      Functional Status Survey:    Vitals:   09/03/23 0933  BP: 138/88  Pulse: 80  Resp: 19  Temp: 97.8 F (36.6 C)  SpO2: 94%  Weight: 161 lb 3.2 oz (73.1 kg)  Height: 4\' 11"  (1.499 m)   Body mass index is 32.56 kg/m. Physical Exam Vitals reviewed. Exam conducted with a chaperone present Greggory Brandy).  Constitutional:      General: She is not in acute distress.    Appearance: Normal appearance. She is normal weight. She is not ill-appearing or diaphoretic.  HENT:     Head: Normocephalic.     Mouth/Throat:     Mouth: Mucous membranes are moist.     Pharynx: Oropharynx is clear. No oropharyngeal exudate or posterior oropharyngeal erythema.  Eyes:     General: No scleral icterus.       Right eye: No discharge.        Left eye: No discharge.     Conjunctiva/sclera: Conjunctivae normal.     Pupils: Pupils are equal, round, and reactive to light.  Neck:     Vascular: No carotid bruit.  Cardiovascular:     Rate and Rhythm: Normal rate and  regular rhythm.     Pulses: Normal pulses.     Heart sounds: Normal heart sounds. No murmur heard.    No friction rub. No gallop.  Pulmonary:     Effort: Pulmonary effort is normal. No respiratory distress.     Breath sounds: Normal breath sounds. No wheezing, rhonchi or rales.  Chest:     Chest wall: No tenderness.  Abdominal:     General: Bowel sounds are normal. There is no distension.     Palpations: Abdomen is soft. There is no mass.     Tenderness: There is no abdominal tenderness. There is no right CVA tenderness, left CVA tenderness, guarding or rebound.  Genitourinary:    Exam position: Lithotomy position.     Labia:        Right: No rash or lesion.        Left: No rash, tenderness or lesion.      Comments: Left labia pea size abscess hard to palpation without any drainage or tenderness.  Musculoskeletal:        General: No swelling or tenderness. Normal range of motion.     Cervical back: Normal range of motion. No rigidity or tenderness.     Right lower leg: No edema.     Left lower leg: No edema.  Lymphadenopathy:     Cervical: No cervical adenopathy.  Skin:    General: Skin is warm and dry.     Coloration: Skin is not pale.     Findings: No bruising, erythema, lesion or rash.     Comments: Pea size hard bump non-tender to touch with irritated top. No drainage noted   Neurological:     Mental Status: She is alert and oriented to person, place, and time.     Gait: Gait normal.  Psychiatric:        Mood and Affect: Mood normal.        Speech: Speech normal.        Behavior: Behavior normal.     Labs reviewed: Recent Labs    10/12/22 0558 10/22/22 0602  06/04/23 1030  NA 136 138 140  K 3.5 3.5 4.2  CL 104 102 107  CO2 27 27 25   GLUCOSE 95 116* 97  BUN 14 13 15   CREATININE 0.61 0.81 0.77  CALCIUM 9.2 9.7 10.1   Recent Labs    10/22/22 0602 06/04/23 1030  AST 27 18  ALT 17 12  ALKPHOS 57  --   BILITOT 0.9 0.3  PROT 7.1 7.4  ALBUMIN 3.8  --     Recent Labs    10/12/22 0558 10/22/22 0602 06/04/23 1030  WBC 7.2 7.2 6.9  NEUTROABS 2.8 3.5 3,077  HGB 12.6 12.7 13.0  HCT 37.4 39.1 39.5  MCV 92.3 94.2 89.4  PLT 222 263 269   Lab Results  Component Value Date   TSH 0.65 01/14/2021   Lab Results  Component Value Date   HGBA1C 6.3 (H) 06/04/2023   Lab Results  Component Value Date   CHOL 162 06/04/2023   HDL 59 06/04/2023   LDLCALC 79 06/04/2023   TRIG 140 06/04/2023   CHOLHDL 2.7 06/04/2023    Significant Diagnostic Results in last 30 days:  No results found.  Assessment/Plan  Skin Abscess   Presented with a painful bump on the left labia, now decreased in size and no longer painful. Initially large, likely related to an infected hair follicle. No drainage or systemic symptoms. Physical exam shows a small residual area hard to palpate, possibly spontaneously drained. Discussed Keflex to prevent recurrence and importance of cleaning with antibacterial soap.   - Prescribe Keflex 500 mg, BID for 7 days   - Advise thorough cleaning with antibacterial soap   - Monitor for signs of recurrence or infection    Generalized Body Aches   Reports generalized body aches and weakness post-bump appearance. Denies cough, chills, or recent exposure to illness. Vital signs normal, no pain on exam. Advised rest, hydration, and balanced diet.   - Advise rest and hydration   - Encourage balanced diet   - Monitor symptoms and return if she worsens or persists    Follow-up   - Send Keflex prescription to [facility] [address]   - Advise follow-up if symptoms persist or worsen.   Family/ staff Communication: Reviewed plan of care with patient verbalized understanding   Labs/tests ordered: None   Next Appointment: Return if symptoms worsen or fail to improve.   Total time: 15 minutes. Greater than 50% of total time spent doing patient education regarding left labia abscess,generalized body aches and health maintenance including  symptom/medication management.   Caesar Bookman, NP

## 2023-09-07 ENCOUNTER — Telehealth: Payer: Self-pay | Admitting: Gastroenterology
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
## 2023-09-10 NOTE — Telephone Encounter (Signed)
 Okay got it, thanks for let me know

## 2023-09-10 NOTE — Telephone Encounter (Signed)
 Good morning,  It was scheduled for March 5th.

## 2023-09-12 ENCOUNTER — Encounter: Payer: Medicare HMO | Admitting: Gastroenterology

## 2023-10-04 ENCOUNTER — Ambulatory Visit: Payer: Self-pay | Admitting: *Deleted

## 2023-10-04 NOTE — Telephone Encounter (Signed)
 Sending to [name], NP as a FYI for the reason that appointment is pending for 10/19/23 and patient with level 10 pain, however emphasized she only wants to see [name], NP

## 2023-10-04 NOTE — Telephone Encounter (Signed)
 Copied from CRM [id number]. Topic: Clinical - Red Word Triage >> Oct 04, 2023  8:25 AM Maxwell Marion wrote: Red Word that prompted transfer to Nurse Triage: extreme pain in legs every day, rates the pain a 10 on a scale of 1-10. Says it feels like her legs are going to give out. Tylenol extra strength is not working. Reason for Disposition  [1] MODERATE pain (e.g., interferes with normal activities, limping) AND [2] present > 3 days  Answer Assessment - Initial Assessment Questions 1. ONSET: "When did the pain start?"      I'm having pain in both of my legs. 2. LOCATION: "Where is the pain located?"      It bothers me every day.   I work 3rd shift.  After working 4-5 hours my legs start hurting.   I'm using Tylenol but it's not hurting.   When I'm walking pushing my cart it feels like my legs are trying to give out on me.    3. PAIN: "How bad is the pain?"    (Scale 1-10; or mild, moderate, severe)   -  MILD (1-3): doesn't interfere with normal activities    -  MODERATE (4-7): interferes with normal activities (e.g., work or school) or awakens from sleep, limping    -  SEVERE (8-10): excruciating pain, unable to do any normal activities, unable to walk     Moderate      I have arthritis in my knees and my back.   4. WORK OR EXERCISE: "Has there been any recent work or exercise that involved this part of the body?"      Working  5. CAUSE: "What do you think is causing the leg pain?"     I don't know 6. OTHER SYMPTOMS: "Do you have any other symptoms?" (e.g., chest pain, back pain, breathing difficulty, swelling, rash, fever, numbness, weakness)     Back pain too 7. PREGNANCY: "Is there any chance you are pregnant?" "When was your last menstrual period?"     N/A  Protocols used: Leg Pain-A-AH  Chief Complaint: Bilateral leg pain and weakness after working 4-5 hours on her job on night shift. Symptoms: Feels like her legs are going to give out and painful both legs. Frequency: nightly Pertinent  Negatives: Patient denies Tylenol helping Disposition: [] ED /[] Urgent Care (no appt availability in office) / [x] Appointment(In office/virtual)/ []  [facility]/ [] Home Care/ [] Refused Recommended Disposition /[] [facility]/ []  Follow-up with PCP Additional Notes: She only wanted to see Dr. Janyth Contes so got her scheduled with Dr. Janyth Contes for 10/19/2023 at 11:00.

## 2023-10-04 NOTE — Telephone Encounter (Signed)
 Noted thank you

## 2023-10-19 ENCOUNTER — Ambulatory Visit: Admitting: Nurse Practitioner

## 2023-10-19 ENCOUNTER — Telehealth: Payer: Self-pay

## 2023-10-19 VITALS — BP 138/84 | HR 82 | Temp 97.1°F | Ht 59.0 in | Wt 161.0 lb

## 2023-10-19 DIAGNOSIS — E2839 Other primary ovarian failure: Secondary | ICD-10-CM

## 2023-10-19 DIAGNOSIS — G8929 Other chronic pain: Secondary | ICD-10-CM | POA: Diagnosis not present

## 2023-10-19 DIAGNOSIS — M5416 Radiculopathy, lumbar region: Secondary | ICD-10-CM | POA: Diagnosis not present

## 2023-10-19 MED ORDER — PREDNISONE 10 MG (21) PO TBPK: ORAL_TABLET | ORAL | 0 refills | Status: DC

## 2023-10-19 NOTE — Telephone Encounter (Signed)
Message routed to PCP Eubanks, Jessica K, NP  

## 2023-10-19 NOTE — Telephone Encounter (Signed)
 I already printed work note when she was here.

## 2023-10-19 NOTE — Patient Instructions (Signed)
 To use heating pad to back three times daily as needed for pain Tylenol 1000 mg every 8 hours as needed for pain Take prednisone dose pack as prescribed.  To get xray of back.  Physical therapy has been ordered
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
## 2023-10-19 NOTE — Telephone Encounter (Signed)
 Copied from CRM [id number]. Topic: General - Other >> Oct 19, 2023 12:10 PM Irine Seal wrote: Reason for CRM: Patient is in the parking lot after leaving her appointment and stated she needs a work excuse for 2 days. Called [facility] and spoke with Darlina Rumpf, who advised that clinic staff/ provider are out for lunch. Darlina Rumpf stated the excuse can be printed for pickup, uploaded to MyChart, or faxed. Patient will call her employer to obtain a fax number and will call back once she has it.

## 2023-11-01 ENCOUNTER — Ambulatory Visit (INDEPENDENT_AMBULATORY_CARE_PROVIDER_SITE_OTHER): Admitting: Rehabilitative and Restorative Service Providers"

## 2023-11-01 ENCOUNTER — Encounter: Payer: Self-pay | Admitting: Rehabilitative and Restorative Service Providers"

## 2023-11-01 DIAGNOSIS — M6281 Muscle weakness (generalized): Secondary | ICD-10-CM | POA: Diagnosis not present

## 2023-11-01 DIAGNOSIS — R293 Abnormal posture: Secondary | ICD-10-CM | POA: Diagnosis not present

## 2023-11-01 DIAGNOSIS — R262 Difficulty in walking, not elsewhere classified: Secondary | ICD-10-CM | POA: Diagnosis not present

## 2023-11-01 NOTE — Therapy (Signed)
 OUTPATIENT PHYSICAL THERAPY THORACOLUMBAR EVALUATION   Patient Name: [name] MRN: [id number] DOB:1955/08/04, 68 y.o., female Today's Date: 11/01/2023  END OF SESSION:  PT End of Session - 11/01/23 1820     Visit Number 1    Number of Visits 16    Date for PT Re-Evaluation 12/27/23    Authorization Type [employer]    Authorization - Visit Number 1    Authorization - Number of Visits 20    Progress Note Due on Visit 10    PT Start Time 1020    PT Stop Time 1103    PT Time Calculation (min) 43 min    Activity Tolerance Patient tolerated treatment well;No increased pain;Patient limited by fatigue    Behavior During Therapy [facility] for tasks assessed/performed             Past Medical History:  Diagnosis Date   Acid reflux    Per [facility]   Alcohol use    Per Records received from [facility]- pt states she only has a [place] of wine occasionally   Arthritis    Knee and hip   BMI 36.0-36.9,adult    Per [facility]   Chronic knee pain    L & R   GERD (gastroesophageal reflux disease)    Heart murmur, systolic 09/19/2021   1/6 systolic   Hyperlipidemia    Hypertension    Morbid obesity (HCC)    Per Records received from [facility]   Other osteoarthritis involving multiple joints    Per Records received from [employer]   Prediabetes    Per Records received from [facility]   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[facility])    Per records from [employer] [facility]   Right hip pain    Per records from [employer] [facility]   Right thigh pain 2022   Per records from [employer] [facility] the [facility]   Vitamin D  deficiency    Per Records received from [employer]   Past Surgical History:  Procedure Laterality Date   COLONOSCOPY     JOINT REPLACEMENT Left 07/11/2018   knee replacement   TOE SURGERY Right    bone spur "shaved" down- many years ago   TOTAL HIP ARTHROPLASTY Right  09/21/2021   Procedure: RIGHT TOTAL HIP ARTHROPLASTY ANTERIOR APPROACH;  Surgeon: Wes Hamman, MD;  Location: [facility] OR;  Service: Orthopedics;  Laterality: Right;   TUBAL LIGATION  1978   [facility]   Patient Active Problem List   Diagnosis Date Noted   Acute gout due to renal impairment involving toe of left foot 06/11/2023   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   Hyperlipidemia 08/18/2021   Hypertension 08/18/2021   Obesity 08/18/2021   Primary osteoarthritis of right hip 03/24/2021   Trigger finger, acquired 08/12/2009   Anxiety state 05/20/2009   CANDIDIASIS, SKIN 03/12/2009   TINGLING 12/24/2008   CHEST PAIN 02/03/2008   HYPOKALEMIA 11/07/2007   Other specified disorders of bladder 09/26/2007   Headache 08/07/2007   Constipation 07/23/2007   MICROSCOPIC HEMATURIA 07/23/2007   Dental caries 05/27/2007   TINEA PEDIS 05/23/2007   Allergic rhinitis 05/23/2007   GERD 05/23/2007   Diaphragmatic hernia 05/23/2007   DEGENERATIVE JOINT DISEASE, LEFT KNEE 05/23/2007   ESSENTIAL HYPERTENSION, BENIGN 05/20/2007    PCP: Verma Gobble, NP  REFERRING PROVIDER:  Verma Gobble, NP  REFERRING DIAG: [id number] (ICD-10-CM) - Chronic radicular pain of lower back  Rationale for Evaluation and Treatment: Rehabilitation  THERAPY DIAG:  Abnormal posture  Difficulty in walking, not elsewhere classified  Muscle weakness (generalized)  ONSET DATE: 1 ~ 2 months  SUBJECTIVE:                                                                                                                                                                                           SUBJECTIVE STATEMENT: [name] notes after 4-5 hours at work, she has to hold onto walls because her legs "give out."  No real pain or paresthesias.  PERTINENT HISTORY:  HLD, HTN, pre-diabetes, Lt TKA, Rt THA  PAIN:  Are you having pain? Yes: NPRS scale: 0-4/10  over the past week Pain location: Gluteals, thighs Pain description: Throbbing Aggravating factors: After 4-5 hours at work, later in the day Relieving factors: Get off her feet and rest  PRECAUTIONS: Back  RED FLAGS: None   WEIGHT BEARING RESTRICTIONS: No  FALLS:  Has patient fallen in last 6 months? 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  NEXT MD VISIT: 12/07/2023  OBJECTIVE:  Note: Objective measures were completed at Evaluation unless otherwise noted.  DIAGNOSTIC FINDINGS:  IMPRESSION: 1. 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 No encroachment upon the neural structures. This could relate to back pain or referred facet syndrome pain. Mild bulging of the L4-5 disc.  PATIENT SURVEYS:  Patient specific functional scale (0 unable to 10 no difficulty) 1) Doing normal work activities, particularly later in her shift 4/10 2) Activities late in the day 4/10 Total: 8/20 or 40%  COGNITION: Overall cognitive status: Within functional limits for tasks assessed     SENSATION: WFL   POSTURE: rounded shoulders, forward head, decreased lumbar lordosis, and flexed trunk   LUMBAR ROM:   AROM Eval 11/01/2023  Flexion   Extension 5  Right lateral flexion   Left lateral flexion   Right rotation   Left rotation    (Blank rows = not tested)  LOWER EXTREMITY ROM:     Active  Right eval Left eval  Hip flexion    Hip extension    Hip abduction  Hip adduction    Hip internal rotation    Hip external rotation    Knee flexion    Knee extension    Ankle dorsiflexion     Ankle plantarflexion    Ankle inversion    Ankle eversion     (Blank rows = not tested)  LOWER EXTREMITY STRENGTH:    In pounds assessed with hand-held dynamometer Left/Right 11/01/2023   Hip flexion    Hip extension    Hip abduction 51.1/36.7 pounds   Hip adduction    Hip internal rotation    Hip external rotation    Knee flexion    Knee extension 53.9/67.2 pounds   Ankle dorsiflexion    Ankle plantarflexion    Ankle inversion    Ankle eversion     (Blank rows = not tested)  GAIT: Distance walked: 50 feet Assistive device utilized: None Level of assistance: Complete Independence Comments: Isabell notes she sometimes has to use a cane when fatigued from her workday.  She walks with a forward flexed posture and notes her endurance is much more limited over the last 1 to 2 months.  TREATMENT DATE: 11/01/2023 Standing lumbar extension AROM 10 x 3 seconds  Functional activities:       Sit to stand with slow eccentrics 2 sets of 5 Alternating hip hike at a countertop 10 x 3 seconds Reviewed exam findings and day 1 home exercises  Neuromuscular re-education: Tandem balance 4 x 20 seconds                                                                                                                        PATIENT EDUCATION:  Education details: See above Person educated: Patient Education method: Explanation, Demonstration, Tactile cues, Verbal cues, and Handouts Education comprehension: verbalized understanding, returned demonstration, verbal cues required, tactile cues required, and needs further education  HOME EXERCISE PROGRAM: Access Code: [email or web address] URL: [email or web address] Date: 11/01/2023 Prepared by: Terral Ferrari  Exercises - Standing Lumbar Extension at Wall - Forearms  - 5 x daily - 7 x weekly - 1 sets - 5 reps - 3 seconds hold - Sit to Stand Without Arm Support  - 5 x daily - 7 x weekly - 1 sets - 5 reps - Standing Hip Hiking  - 5 x daily - 7 x  weekly - 1 sets - 10 reps - 3 seconds hold - Tandem Stance  - 5 x daily - 7 x weekly - 1 sets - 2 reps - 20 second hold  ASSESSMENT:  CLINICAL IMPRESSION: Patient is a 68 y.o. female who was seen today for physical therapy evaluation and treatment for M54.16,G89.29 (ICD-10-CM) - Chronic radicular pain of lower back.  Paul's biggest concern is that her legs fatigue and feel like they are going to give out after 4 to 5 hours at her job cleaning at a hotel.  This appears to be related more to weakness and poor endurance rather than anything radicular.  We will certainly  address postural and spine impairments noted today including a very flexed posture, although significant hip abductors and quadriceps weakness along with limited standing and walking endurance will be the focus of her rehabilitation.  Because Paulo was able to get into supervised physical therapy in a relatively short period of time since symptoms started, I anticipate she should be able to meet the below listed long-term goals within the recommended plan of care.  OBJECTIVE IMPAIRMENTS: Abnormal gait, cardiopulmonary status limiting activity, decreased activity tolerance, decreased endurance, decreased knowledge of condition, difficulty walking, decreased strength, impaired perceived functional ability, and pain.   ACTIVITY LIMITATIONS: carrying, lifting, bending, standing, squatting, stairs, and locomotion level  PARTICIPATION LIMITATIONS: cleaning, community activity, and occupation  PERSONAL FACTORS: HLD, HTN, pre-diabetes, Lt TKA, Rt THA are also affecting patient's functional outcome.   REHAB POTENTIAL: Good  CLINICAL DECISION MAKING: Stable/uncomplicated  EVALUATION COMPLEXITY: Moderate   GOALS: Goals reviewed with patient? Yes  SHORT TERM GOALS: Target date: 11/29/2023  Geniene will be independent with her day 1 home exercise program Baseline: Started 11/01/2023 Goal status: INITIAL  2.  Improve bilateral quadriceps  strength to at least 65 pounds Baseline: See objective Goal status: INITIAL  3.  Improve lumbar extension AROM to 10 degrees Baseline: 5 degrees Goal status: INITIAL   LONG TERM GOALS: Target date: 12/27/2023  Improve patient specific functional score to 80% Baseline: 40% Goal status: INITIAL  2.  Amarya will report improved endurance at work and will not have feelings of instability or pain at the end of her normal work shift Baseline: 0-4/10 Goal status: INITIAL  3.  Improve bilateral quadriceps strength to at least 80 pounds Baseline: See objective Goal status: INITIAL  4.  Improve bilateral hip abductors strength to at least 60 pounds Baseline: See objective Goal status: INITIAL  5.  Bobby will be independent with her long-term home exercise program at discharge Baseline: See objective Goal status: INITIAL  PLAN:  PT FREQUENCY: 1-2x/week  PT DURATION: 8 weeks  PLANNED INTERVENTIONS: 97110-Therapeutic exercises, 97530- Therapeutic activity, 97112- Neuromuscular re-education, 97535- Self Care, [id number]- Manual therapy, [phone]- Gait training, Patient/Family education, Balance training, Stair training, Cryotherapy, and Moist heat.  PLAN FOR NEXT SESSION: Review day 1 home exercise program.  Emphasis on lower extremity strength and endurance work along with postural awareness to improve forward flexed trunk with gait.   Joli Neas, PT, MPT 11/01/2023, 6:23 PM

## 2023-11-09 ENCOUNTER — Encounter: Admitting: Rehabilitative and Restorative Service Providers"

## 2023-11-15 ENCOUNTER — Encounter: Admitting: Physical Therapy

## 2023-11-15 NOTE — Therapy (Deleted)
 OUTPATIENT PHYSICAL THERAPY TREATMENT   Patient Name: [name] MRN: [id number] DOB:[birth year], 68 y.o., female Today's Date: 11/15/2023  END OF SESSION:    Past Medical History:  Diagnosis Date   Acid reflux    [facility]   Alcohol use    Per Records received from [facility]- pt states she only has a [place] of wine occasionally   Arthritis    Knee and hip   BMI 36.0-36.9,adult    Per Records received from [facility]   Chronic knee pain    L & R   GERD (gastroesophageal reflux disease)    Heart murmur, systolic 09/19/2021   1/6 systolic   Hyperlipidemia    Hypertension    Morbid obesity (HCC)    Per Records received from [facility]   Other osteoarthritis involving multiple joints    Per Records received from [employer]   Prediabetes    Per Records received from [facility]   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[facility])    Per records from [employer] [facility]   Right hip pain    Per records from [employer] [facility]   Right thigh pain 2022   Per records from [employer] [facility] the [place]   Vitamin D  deficiency    Per Records received from [facility]   Past Surgical History:  Procedure Laterality Date   COLONOSCOPY     JOINT REPLACEMENT Left 07/11/2018   knee replacement   TOE SURGERY Right    bone spur "shaved" down- many years ago   TOTAL HIP ARTHROPLASTY Right 09/21/2021   Procedure: RIGHT TOTAL HIP ARTHROPLASTY ANTERIOR APPROACH;  Surgeon: Wes Hamman, MD;  Location: [facility] OR;  Service: Orthopedics;  Laterality: Right;   TUBAL LIGATION  1978   [facility] records from [facility]   Patient Active Problem List   Diagnosis Date Noted   Acute gout due to renal impairment involving toe of left foot 06/11/2023   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   Hyperlipidemia 08/18/2021   Hypertension 08/18/2021   Obesity 08/18/2021   Primary osteoarthritis of  right hip 03/24/2021   Trigger finger, acquired 08/12/2009   Anxiety state 05/20/2009   CANDIDIASIS, SKIN 03/12/2009   TINGLING 12/24/2008   CHEST PAIN 02/03/2008   HYPOKALEMIA 11/07/2007   Other specified disorders of bladder 09/26/2007   Headache 08/07/2007   Constipation 07/23/2007   MICROSCOPIC HEMATURIA 07/23/2007   Dental caries 05/27/2007   TINEA PEDIS 05/23/2007   Allergic rhinitis 05/23/2007   GERD 05/23/2007   Diaphragmatic hernia 05/23/2007   DEGENERATIVE JOINT DISEASE, LEFT KNEE 05/23/2007   ESSENTIAL HYPERTENSION, BENIGN 05/20/2007    PCP: Verma Gobble, NP  REFERRING PROVIDER: Verma Gobble, NP  REFERRING DIAG: M54.16,G89.29 (ICD-10-CM) - Chronic radicular pain of lower back  Rationale for Evaluation and Treatment: Rehabilitation  THERAPY DIAG:  No diagnosis found.  ONSET DATE: 1 ~ 2 months  SUBJECTIVE:  SUBJECTIVE STATEMENT: ***  From eval: Yassmine notes after 4-5 hours at work, she has to hold onto walls because her legs "give out."  No real pain or paresthesias.  PERTINENT HISTORY:  HLD, HTN, pre-diabetes, Lt TKA, Rt THA  PAIN:  Are you having pain? Yes: NPRS scale: 0-4/10 over the past week Pain location: Gluteals, thighs Pain description: Throbbing Aggravating factors: After 4-5 hours at work, later in the day Relieving factors: Get off her feet and rest  PRECAUTIONS: Back  RED FLAGS: None   WEIGHT BEARING RESTRICTIONS: No  FALLS:  Has patient fallen in last 6 months? 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  NEXT MD VISIT: 12/07/2023  OBJECTIVE:  Note: Objective measures were completed at Evaluation unless otherwise noted.  DIAGNOSTIC FINDINGS:  IMPRESSION: 1. 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 No encroachment upon the neural structures. This could relate to back pain or referred facet syndrome pain. Mild bulging of the L4-5 disc.  PATIENT SURVEYS:  Patient specific functional scale (0 unable to 10 no difficulty) 1) Doing normal work activities, particularly later in her shift 4/10 2) Activities late in the day 4/10 Total: 8/20 or 40%  COGNITION: Overall cognitive status: Within functional limits for tasks assessed     SENSATION: WFL   POSTURE: rounded shoulders, forward head, decreased lumbar lordosis, and flexed trunk   LUMBAR ROM:   AROM Eval 11/01/2023  Flexion   Extension 5  Right lateral flexion   Left lateral flexion   Right rotation   Left rotation    (Blank rows = not tested)  LOWER EXTREMITY ROM:     Active  Right eval Left eval  Hip flexion    Hip extension    Hip abduction    Hip adduction    Hip internal rotation    Hip external rotation    Knee flexion    Knee extension    Ankle dorsiflexion    Ankle plantarflexion    Ankle inversion    Ankle eversion     (Blank rows = not tested)  LOWER EXTREMITY STRENGTH:    In pounds assessed with hand-held dynamometer Left/Right 11/01/2023   Hip flexion    Hip extension    Hip abduction 51.1/36.7 pounds   Hip adduction    Hip internal rotation    Hip external rotation    Knee flexion    Knee extension 53.9/67.2 pounds   Ankle dorsiflexion    Ankle plantarflexion    Ankle inversion    Ankle eversion     (Blank rows = not tested)  GAIT: Distance walked: 50 feet Assistive device utilized: None Level of assistance: Complete  Independence Comments: Azlin notes she sometimes has to use a cane when fatigued from her workday.  She walks with a forward flexed posture and notes her endurance is much more limited over the last 1 to 2 months.  TREATMENT DATE:  11/15/23 ***  11/01/2023 Standing lumbar extension AROM 10 x 3 seconds  Functional activities:       Sit to stand with slow eccentrics 2 sets of 5 Alternating  hip hike at a countertop 10 x 3 seconds Reviewed exam findings and day 1 home exercises  Neuromuscular re-education: Tandem balance 4 x 20 seconds                                                                                                                        PATIENT EDUCATION:  Education details: See above Person educated: Patient Education method: Explanation, Demonstration, Tactile cues, Verbal cues, and Handouts Education comprehension: verbalized understanding, returned demonstration, verbal cues required, tactile cues required, and needs further education  HOME EXERCISE PROGRAM: Access Code: [email or web address] URL: [email or web address] Date: 11/01/2023 Prepared by: Terral Ferrari  Exercises - Standing Lumbar Extension at Wall - Forearms  - 5 x daily - 7 x weekly - 1 sets - 5 reps - 3 seconds hold - Sit to Stand Without Arm Support  - 5 x daily - 7 x weekly - 1 sets - 5 reps - Standing Hip Hiking  - 5 x daily - 7 x weekly - 1 sets - 10 reps - 3 seconds hold - Tandem Stance  - 5 x daily - 7 x weekly - 1 sets - 2 reps - 20 second hold  ASSESSMENT:  CLINICAL IMPRESSION: ***  From eval: Patient is a 68 y.o. female who was seen today for physical therapy evaluation and treatment for M54.16,G89.29 (ICD-10-CM) - Chronic radicular pain of lower back.  Paul's biggest concern is that her legs fatigue and feel like they are going to give out after 4 to 5 hours at her job cleaning at a hotel.  This appears to be related more to weakness and poor endurance rather than anything radicular.  We  will certainly address postural and spine impairments noted today including a very flexed posture, although significant hip abductors and quadriceps weakness along with limited standing and walking endurance will be the focus of her rehabilitation.  Because Ciani was able to get into supervised physical therapy in a relatively short period of time since symptoms started, I anticipate she should be able to meet the below listed long-term goals within the recommended plan of care.  OBJECTIVE IMPAIRMENTS: Abnormal gait, cardiopulmonary status limiting activity, decreased activity tolerance, decreased endurance, decreased knowledge of condition, difficulty walking, decreased strength, impaired perceived functional ability, and pain.   ACTIVITY LIMITATIONS: carrying, lifting, bending, standing, squatting, stairs, and locomotion level  PARTICIPATION LIMITATIONS: cleaning, community activity, and occupation  PERSONAL FACTORS: HLD, HTN, pre-diabetes, Lt TKA, Rt THA are also affecting patient's functional outcome.   REHAB POTENTIAL: Good  CLINICAL DECISION MAKING: Stable/uncomplicated  EVALUATION COMPLEXITY: Moderate   GOALS: Goals reviewed with patient? Yes  SHORT TERM GOALS: Target date: 11/29/2023  Ioana will be independent with her day 1 home exercise program Baseline: Started 11/01/2023 Goal status: INITIAL  2.  Improve bilateral quadriceps strength to at least 65 pounds Baseline: See objective Goal status: INITIAL  3.  Improve lumbar extension AROM to 10 degrees Baseline: 5 degrees Goal status: INITIAL   LONG  TERM GOALS: Target date: 12/27/2023  Improve patient specific functional score to 80% Baseline: 40% Goal status: INITIAL  2.  Bernyce will report improved endurance at work and will not have feelings of instability or pain at the end of her normal work shift Baseline: 0-4/10 Goal status: INITIAL  3.  Improve bilateral quadriceps strength to at least 80 pounds Baseline: See  objective Goal status: INITIAL  4.  Improve bilateral hip abductors strength to at least 60 pounds Baseline: See objective Goal status: INITIAL  5.  Rinnah will be independent with her long-term home exercise program at discharge Baseline: See objective Goal status: INITIAL  PLAN:  PT FREQUENCY: 1-2x/week  PT DURATION: 8 weeks  PLANNED INTERVENTIONS: 97110-Therapeutic exercises, 97530- Therapeutic activity, 97112- Neuromuscular re-education, 97535- Self Care, 16109- Manual therapy, [phone]- Gait training, Patient/Family education, Balance training, Stair training, Cryotherapy, and Moist heat.  PLAN FOR NEXT SESSION: Review day 1 home exercise program.  Emphasis on lower extremity strength and endurance work along with postural awareness to improve forward flexed trunk with gait.   Dimple Bastyr April Ma L Nery Frappier, PT, DPT 11/15/2023, 7:55 AM

## 2023-11-16 ENCOUNTER — Encounter: Admitting: Physical Therapy

## 2023-11-20 ENCOUNTER — Encounter: Admitting: Physical Therapy

## 2023-11-26 ENCOUNTER — Encounter: Admitting: Physical Therapy

## 2023-12-04 ENCOUNTER — Encounter: Admitting: Physical Therapy

## 2023-12-04 ENCOUNTER — Encounter: Payer: Self-pay | Admitting: Nurse Practitioner

## 2023-12-04 NOTE — Patient Instructions (Addendum)
 1.) Visit your local pharmacy to receive your shingrix and td/tdap if you have not already

## 2023-12-07 ENCOUNTER — Encounter: Payer: Medicare HMO | Admitting: Nurse Practitioner

## 2023-12-07 DIAGNOSIS — G8929 Other chronic pain: Secondary | ICD-10-CM

## 2023-12-07 NOTE — Progress Notes (Signed)
 This encounter was created in error - please disregard.

## 2023-12-11 ENCOUNTER — Emergency Department (HOSPITAL_COMMUNITY)
Admission: EM | Admit: 2023-12-11 | Discharge: 2023-12-11 | Disposition: A | Discharge status: Home / Self Care | Attending: Emergency Medicine | Admitting: Emergency Medicine

## 2023-12-11 ENCOUNTER — Encounter (HOSPITAL_COMMUNITY): Payer: Self-pay

## 2023-12-11 ENCOUNTER — Encounter: Admitting: Physical Therapy

## 2023-12-11 ENCOUNTER — Other Ambulatory Visit: Payer: Self-pay

## 2023-12-11 DIAGNOSIS — L299 Pruritus, unspecified: Secondary | ICD-10-CM | POA: Diagnosis present

## 2023-12-11 DIAGNOSIS — I10 Essential (primary) hypertension: Secondary | ICD-10-CM | POA: Insufficient documentation

## 2023-12-11 DIAGNOSIS — Z79899 Other long term (current) drug therapy: Secondary | ICD-10-CM | POA: Diagnosis not present

## 2023-12-11 MED ORDER — DEXAMETHASONE SODIUM PHOSPHATE 10 MG/ML IJ SOLN
10.0000 mg | Freq: Once | INTRAMUSCULAR | Status: AC
  Administered 2023-12-11: 10 mg via INTRAMUSCULAR
  Filled 2023-12-11: qty 1

## 2023-12-11 MED ORDER — DIPHENHYDRAMINE HCL 25 MG PO CAPS
25.0000 mg | ORAL_CAPSULE | Freq: Once | ORAL | Status: AC
  Administered 2023-12-11: 25 mg via ORAL
  Filled 2023-12-11: qty 1

## 2023-12-11 MED ORDER — HYDROXYZINE HCL 25 MG PO TABS: 25.0000 mg | ORAL_TABLET | Freq: Three times a day (TID) | ORAL | 0 refills | Status: AC | PRN

## 2023-12-11 NOTE — ED Triage Notes (Signed)
 Pt arrived from home via pOV c/o itching all over that started today. Pt denies rash

## 2023-12-11 NOTE — ED Provider Notes (Signed)
 [place] EMERGENCY DEPARTMENT AT [facility] Provider Note   CSN: [id number] Arrival date & time: 12/11/23  0041     History  Chief Complaint  Patient presents with   Pruritis    [name] is a 68 y.o. female.  Patient is a 68 year old female with a history of hypertension, GERD, hyperlipidemia, prediabetes, rheumatoid arthritis who presents with itching.  She said she started having some itching yesterday evening.  It started on her feet and then progressed all over.  Now she feels itchy all over.  She denies any known rash.  No known new exposures.  No new medications.  She has not had any known bug bites.  No swelling of her lips or tongue.  No shortness of breath.  No history of similar symptoms in the past.  No known history of liver disease.  She got some Benadryl  here in the ED without significant improvement in symptoms.       Home Medications Prior to Admission medications   Medication Sig Start Date End Date Taking? Authorizing Provider  hydrOXYzine  (ATARAX ) 25 MG tablet Take 1 tablet (25 mg total) by mouth every 8 (eight) hours as needed for itching. 12/11/23  Yes Hershel Los, MD  acetaminophen  (TYLENOL ) 500 MG tablet Take 500 mg by mouth as needed.    [provider]  calcium  carbonate (CALCIUM  600) 600 MG TABS tablet Take 1 tablet (600 mg total) by mouth 2 (two) times daily with a meal. 06/12/22   Verma Gobble, NP  Cholecalciferol (VITAMIN D3) 50 MCG (2000 UT) capsule Take 1 capsule (2,000 Units total) by mouth daily. 06/12/22   Eubanks, Jessica K, NP  lisinopril -hydrochlorothiazide  (ZESTORETIC ) 20-12.5 MG tablet Take 1 tablet by mouth once daily 12/11/22   Eubanks, Jessica K, NP  Multiple Vitamin (MULTIVITAMIN WITH MINERALS) TABS tablet Take 1 tablet by mouth daily.    [provider]  pantoprazole  (PROTONIX ) 20 MG tablet Take 1 tablet by mouth once daily 07/23/23   Eubanks, Jessica K, NP  predniSONE  (STERAPRED UNI-PAK 21 TAB) 10 MG  (21) TBPK tablet Use as directed 10/19/23   Eubanks, Jessica K, NP  rosuvastatin  (CRESTOR ) 20 MG tablet Take 1 tablet (20 mg total) by mouth daily. 01/09/23   Eubanks, Jessica K, NP      Allergies    Patient has no known allergies.    Review of Systems   Review of Systems  Constitutional:  Negative for chills, diaphoresis, fatigue and fever.  HENT:  Negative for congestion, rhinorrhea and sneezing.   Eyes: Negative.   Respiratory:  Negative for cough, chest tightness and shortness of breath.   Cardiovascular:  Negative for chest pain and leg swelling.  Gastrointestinal:  Negative for abdominal pain, blood in stool, diarrhea, nausea and vomiting.  Genitourinary:  Negative for difficulty urinating, flank pain, frequency and hematuria.  Musculoskeletal:  Negative for arthralgias and back pain.  Skin:  Negative for rash.       Itching  Neurological:  Negative for dizziness, speech difficulty, weakness, numbness and headaches.    Physical Exam Updated Vital Signs BP 107/83 (BP Location: Left Arm)   Pulse 73   Temp (!) 97.5 F (36.4 C)   Resp 16   Ht 4\' 11"  (1.499 m)   Wt 70.3 kg   SpO2 98%   BMI 31.31 kg/m  Physical Exam Constitutional:      Appearance: She is well-developed.  HENT:     Head: Normocephalic and atraumatic.  Mouth/Throat:     Comments: No angioedema Eyes:     Pupils: Pupils are equal, round, and reactive to light.  Cardiovascular:     Rate and Rhythm: Normal rate and regular rhythm.     Heart sounds: Normal heart sounds.  Pulmonary:     Effort: Pulmonary effort is normal. No respiratory distress.     Breath sounds: Normal breath sounds. No wheezing or rales.  Chest:     Chest wall: No tenderness.  Abdominal:     General: Bowel sounds are normal.     Palpations: Abdomen is soft.     Tenderness: There is no abdominal tenderness. There is no guarding or rebound.  Musculoskeletal:        General: Normal range of motion.     Cervical back: Normal range of  motion and neck supple.  Lymphadenopathy:     Cervical: No cervical adenopathy.  Skin:    General: Skin is warm and dry.     Findings: No rash.     Comments: There is some small patches of redness on her arms.  It is blanching.  No petechiae or purpura.  No vesicles.  No rash in the webspaces of her fingers.  Neurological:     Mental Status: She is alert and oriented to person, place, and time.     ED Results / Procedures / Treatments   Labs (all labs ordered are listed, but only abnormal results are displayed) Labs Reviewed - No data to display  EKG None  Radiology No results found.  Procedures Procedures    Medications Ordered in ED Medications  dexamethasone  (DECADRON ) injection 10 mg (has no administration in time range)  diphenhydrAMINE  (BENADRYL ) capsule 25 mg (25 mg Oral Given 12/11/23 0204)    ED Course/ Medical Decision Making/ A&P                                 Medical Decision Making Risk Prescription drug management.   Patient is 68 year old who presents with itching.  She has not noticed any rash but I do see some small red patches on her arms.  I do not see any clinical suggestions of scabies.  She does not have any airway involvement or angioedema.  She does not have any other symptoms that would be more concerning for pruritus related to other etiologies such as hepatitis.  I reviewed her chart and her last blood work was done about 6 months ago that showed normal LFTs.  Will treat her symptomatically.  She was given dose of Decadron  and will prescribe her Atarax .  Encouraged her to follow-up with her PCP to assess for improvement.  Return precautions were given.  Final Clinical Impression(s) / ED Diagnoses Final diagnoses:  Pruritus    Rx / DC Orders ED Discharge Orders          Ordered    hydrOXYzine  (ATARAX ) 25 MG tablet  Every 8 hours PRN        12/11/23 0850              Hershel Los, MD 12/11/23 [id number]

## 2023-12-11 NOTE — ED Provider Triage Note (Signed)
 Emergency Medicine Provider Triage Evaluation Note  [name] , a 68 y.o. female  was evaluated in triage.  Pt complains of pruritus.  Patient states she went to a store this morning began itching her feet and throughout the day the itchiness is spread throughout her entire body.  She went to work [employer] and they told her to go home and go to the emergency department for evaluation due to her widespread itchiness.  She denies any rash, shortness of breath, difficulty swallowing, GI upset.  Review of Systems  Positive:  Negative:   Physical Exam  BP (!) 174/80 (BP Location: Right Arm)   Pulse 80   Temp 98.4 F (36.9 C)   Resp 17   SpO2 96%  Gen:   Awake, no distress   Resp:  Normal effort  MSK:   Moves extremities without difficulty  Other:    Medical Decision Making  Medically screening exam initiated at 1:42 AM.  Appropriate orders placed.  [name] was informed that the remainder of the evaluation will be completed by another provider, this initial triage assessment does not replace that evaluation, and the importance of remaining in the ED until their evaluation is complete.     Elisa Guest, PA-C 12/11/23 [phone]

## 2023-12-11 NOTE — Discharge Instructions (Signed)
Follow-up with your primary care doctor.  Return to the emergency room if you have any worsening symptoms

## 2023-12-27 ENCOUNTER — Ambulatory Visit: Payer: Self-pay

## 2023-12-27 NOTE — Telephone Encounter (Signed)
 FYI Only or Action Required?: Action required by provider: request for appointment.  Patient was last seen in primary care on 10/19/2023 by Stephanie Gobble, NP. Called Nurse Triage reporting Leg Pain. Symptoms began several weeks ago. Interventions attempted: Nothing. Symptoms are: gradually worsening.  Triage Disposition: See PCP When Office is Open (Within 3 Days)  Patient/caregiver understands and will follow disposition?: Yes, will follow disposition  Copied from CRM [id number]. Topic: Clinical - Red Word Triage >> Dec 27, 2023  3:47 PM Brittney F wrote: Red Word that prompted transfer to Nurse Triage:   Extreme pain in legs  Patient stated that tylenol  is no longer helping Reason for Disposition  [1] MODERATE pain (e.g., interferes with normal activities, limping) AND [2] present > 3 days  Answer Assessment - Initial Assessment Questions 1. ONSET: When did the pain start?      Its been awhile, seen pcp for this, has seen PT states that it has not been helping 2. LOCATION: Where is the pain located?      bilateral 3. PAIN: How bad is the pain?    (Scale 1-10; or mild, moderate, severe)   -  MILD (1-3): doesn't interfere with normal activities    -  MODERATE (4-7): interferes with normal activities (e.g., work or school) or awakens from sleep, limping    -  SEVERE (8-10): excruciating pain, unable to do any normal activities, unable to walk     Pt states that she took a pill at 0800 after work, states 6, pt states that worse with work 4. WORK OR EXERCISE: Has there been any recent work or exercise that involved this part of the body?      Work is a lot of standing and walking 5. CAUSE: What do you think is causing the leg pain?     unsure 6. OTHER SYMPTOMS: Do you have any other symptoms? (e.g., chest pain, back pain, breathing difficulty, swelling, rash, fever, numbness, weakness)     Pt states she believes there is inflammation, that is when it is the most  painful.  Protocols used: Leg Pain-A-AH

## 2023-12-27 NOTE — Telephone Encounter (Signed)
 Please advise we do not have any in office appointments

## 2023-12-28 NOTE — Telephone Encounter (Signed)
 Spoke with patient and she does agree with the response of Eubanks, Jessica K, NP.  Message sent to Verma Gobble, NP

## 2023-12-28 NOTE — Telephone Encounter (Signed)
 Left a detail message to have the patient to callback in order to set up an appointment with another provider in the office because Verma Gobble, NP is booked with in office.

## 2023-12-28 NOTE — Telephone Encounter (Signed)
 If she can not wait until next week recommend going to urgent care- she was also a no show to last follow up appt so needs to be seen for medical management as well in office.

## 2024-01-04 ENCOUNTER — Ambulatory Visit (INDEPENDENT_AMBULATORY_CARE_PROVIDER_SITE_OTHER): Admitting: Nurse Practitioner

## 2024-01-04 ENCOUNTER — Ambulatory Visit
Admission: RE | Admit: 2024-01-04 | Discharge: 2024-01-04 | Disposition: A | Discharge status: Home / Self Care | Source: Ambulatory Visit | Attending: Nurse Practitioner | Admitting: Nurse Practitioner

## 2024-01-04 ENCOUNTER — Other Ambulatory Visit: Payer: Self-pay | Admitting: Nurse Practitioner

## 2024-01-04 ENCOUNTER — Encounter: Payer: Self-pay | Admitting: Nurse Practitioner

## 2024-01-04 VITALS — BP 162/90 | HR 73 | Temp 96.4°F | Resp 16 | Ht 59.0 in | Wt 165.8 lb

## 2024-01-04 DIAGNOSIS — M5416 Radiculopathy, lumbar region: Secondary | ICD-10-CM

## 2024-01-04 DIAGNOSIS — G8929 Other chronic pain: Secondary | ICD-10-CM

## 2024-01-04 DIAGNOSIS — E782 Mixed hyperlipidemia: Secondary | ICD-10-CM

## 2024-01-04 DIAGNOSIS — Z1212 Encounter for screening for malignant neoplasm of rectum: Secondary | ICD-10-CM

## 2024-01-04 DIAGNOSIS — Z1231 Encounter for screening mammogram for malignant neoplasm of breast: Secondary | ICD-10-CM

## 2024-01-04 DIAGNOSIS — E2839 Other primary ovarian failure: Secondary | ICD-10-CM

## 2024-01-04 DIAGNOSIS — Z1211 Encounter for screening for malignant neoplasm of colon: Secondary | ICD-10-CM

## 2024-01-04 DIAGNOSIS — R739 Hyperglycemia, unspecified: Secondary | ICD-10-CM

## 2024-01-04 DIAGNOSIS — K219 Gastro-esophageal reflux disease without esophagitis: Secondary | ICD-10-CM | POA: Diagnosis not present

## 2024-01-04 DIAGNOSIS — I10 Essential (primary) hypertension: Secondary | ICD-10-CM

## 2024-01-04 NOTE — Patient Instructions (Addendum)
 To go to [place] [facility] to get xray of low back- this is a walk in appt.   Call [phone] to make appt for bone density and mammogram    Call Armbruster, Elspeth SQUIBB, MD to schedule appt for colonoscopy  Address: [address], [place], [place] [address] Phone: [phone]
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
## 2024-01-05 LAB — CBC WITH DIFFERENTIAL/PLATELET
Absolute Lymphocytes: 2263 {cells}/uL (ref 850–3900)
Absolute Monocytes: 403 {cells}/uL (ref 200–950)
Basophils Absolute: 50 {cells}/uL (ref 0–200)
Basophils Relative: 0.8 %
Eosinophils Absolute: 459 {cells}/uL (ref 15–500)
Eosinophils Relative: 7.4 %
HCT: 38.5 % (ref 35.0–45.0)
Hemoglobin: 12.6 g/dL (ref 11.7–15.5)
MCH: 30.1 pg (ref 27.0–33.0)
MCHC: 32.7 g/dL (ref 32.0–36.0)
MCV: 92.1 fL (ref 80.0–100.0)
MPV: 11.7 fL (ref 7.5–12.5)
Monocytes Relative: 6.5 %
Neutro Abs: 3026 {cells}/uL (ref 1500–7800)
Neutrophils Relative %: 48.8 %
Platelets: 266 10*3/uL (ref 140–400)
RBC: 4.18 10*6/uL (ref 3.80–5.10)
RDW: 12 % (ref 11.0–15.0)
Total Lymphocyte: 36.5 %
WBC: 6.2 10*3/uL (ref 3.8–10.8)

## 2024-01-05 LAB — LIPID PANEL
Cholesterol: 163 mg/dL (ref <200)
HDL: 64 mg/dL (ref >=50)
LDL Cholesterol (Calc): 81 mg/dL
Non-HDL Cholesterol (Calc): 99 mg/dL (ref <130)
Total CHOL/HDL Ratio: 2.5 (calc) (ref <5.0)
Triglycerides: 98 mg/dL (ref <150)

## 2024-01-05 LAB — HEMOGLOBIN A1C
Hgb A1c MFr Bld: 6.4 % — ABNORMAL HIGH (ref <5.7)
Mean Plasma Glucose: 137 mg/dL
eAG (mmol/L): 7.6 mmol/L

## 2024-01-05 LAB — COMPREHENSIVE METABOLIC PANEL WITH GFR
AG Ratio: 1.4 (calc) (ref 1.0–2.5)
ALT: 12 U/L (ref 6–29)
AST: 16 U/L (ref 10–35)
Albumin: 4 g/dL (ref 3.6–5.1)
Alkaline phosphatase (APISO): 69 U/L (ref 37–153)
BUN: 12 mg/dL (ref 7–25)
CO2: 27 mmol/L (ref 20–32)
Calcium: 9.5 mg/dL (ref 8.6–10.4)
Chloride: 107 mmol/L (ref 98–110)
Creat: 0.71 mg/dL (ref 0.50–1.05)
Globulin: 2.9 g/dL (ref 1.9–3.7)
Glucose, Bld: 92 mg/dL (ref 65–99)
Potassium: 4.4 mmol/L (ref 3.5–5.3)
Sodium: 142 mmol/L (ref 135–146)
Total Bilirubin: 0.3 mg/dL (ref 0.2–1.2)
Total Protein: 6.9 g/dL (ref 6.1–8.1)
eGFR: 93 mL/min/{1.73_m2} (ref >=60)

## 2024-01-07 ENCOUNTER — Telehealth (HOSPITAL_BASED_OUTPATIENT_CLINIC_OR_DEPARTMENT_OTHER): Payer: Self-pay

## 2024-01-07 ENCOUNTER — Ambulatory Visit: Payer: Self-pay | Admitting: Nurse Practitioner

## 2024-01-08 ENCOUNTER — Ambulatory Visit: Payer: Self-pay | Admitting: Nurse Practitioner

## 2024-01-09 DIAGNOSIS — R739 Hyperglycemia, unspecified: Secondary | ICD-10-CM | POA: Insufficient documentation

## 2024-01-09 DIAGNOSIS — G8929 Other chronic pain: Secondary | ICD-10-CM | POA: Insufficient documentation

## 2024-01-09 NOTE — Assessment & Plan Note (Signed)
 Encouraged dietary modification

## 2024-01-09 NOTE — Assessment & Plan Note (Signed)
 Continues on crestor , follow lipids yearly

## 2024-01-09 NOTE — Assessment & Plan Note (Signed)
Controlled on protonix.   

## 2024-01-09 NOTE — Assessment & Plan Note (Signed)
 Continue exercises, tylenol  PRN pain.  Will follow up imaging.

## 2024-01-09 NOTE — Assessment & Plan Note (Signed)
 Blood pressure well controlled, goal bp <140/90 Continue current medications and dietary modifications follow metabolic panel

## 2024-01-17 ENCOUNTER — Ambulatory Visit (AMBULATORY_SURGERY_CENTER)

## 2024-01-17 VITALS — Ht 59.0 in | Wt 165.0 lb

## 2024-01-17 DIAGNOSIS — Z1211 Encounter for screening for malignant neoplasm of colon: Secondary | ICD-10-CM

## 2024-01-17 MED ORDER — NA SULFATE-K SULFATE-MG SULF 17.5-3.13-1.6 GM/177ML PO SOLN: 1.0000 | Freq: Once | ORAL | 0 refills | Status: AC

## 2024-01-17 NOTE — Progress Notes (Signed)

## 2024-01-22 ENCOUNTER — Other Ambulatory Visit: Payer: Self-pay | Admitting: Nurse Practitioner

## 2024-01-24 ENCOUNTER — Encounter: Payer: Self-pay | Admitting: Gastroenterology

## 2024-01-24 ENCOUNTER — Telehealth: Payer: Self-pay | Admitting: Gastroenterology

## 2024-01-24 MED ORDER — NA SULFATE-K SULFATE-MG SULF 17.5-3.13-1.6 GM/177ML PO SOLN: 1.0000 | Freq: Once | ORAL | 0 refills | Status: AC

## 2024-01-24 NOTE — Telephone Encounter (Signed)
 Called and spoke with patient - patient requested her Suprep RX be sent to a different pharmacy;  RX sent to requested pharmacy and patient advised to inform pharmacy to use [facility] for price reduction;  Patient advised to call back to the office at [phone] should questions/concerns arise;  Patient verbalized understanding of information/instructions;

## 2024-01-24 NOTE — Telephone Encounter (Signed)
 RTN call to patient, VM obtained and message left that RN was returning her call and to call back if she still needs assistance or has questions regarding her prep.

## 2024-01-24 NOTE — Telephone Encounter (Signed)
 Inbound all from patient requesting f/u call in regards to prep . Please advise.

## 2024-01-25 ENCOUNTER — Other Ambulatory Visit: Payer: Self-pay | Admitting: Nurse Practitioner

## 2024-01-25 NOTE — Telephone Encounter (Signed)
 Contacted patient , left a voicemail in regarding of medication already being signed and sent to her pharmacy. Tried to contact the pharmacy as well however, they don't open until 9am. We should try getting in contact with her in regards of her medication see the reason for the duplicate request within 3 days.

## 2024-01-25 NOTE — Telephone Encounter (Signed)
 Called patient pharmacy in regards to pt duplicate medication order. Informed them that the medication was signes and ordered 3 days ago pharmacy stated that it has been ready for pick up since July 15th. Informed pharmacy I would contact the patient to let her know this information.   Spoke with patient to let her know that he prescription that was requested on 01/25/2024 is ready for pickup at her pharmacy. Refusing prescriptions that's newly requested due to prescription being a duplicate.

## 2024-02-11 ENCOUNTER — Ambulatory Visit: Admitting: Nurse Practitioner

## 2024-02-18 ENCOUNTER — Telehealth: Payer: Self-pay | Admitting: Internal Medicine

## 2024-02-18 NOTE — Telephone Encounter (Signed)
 Good afternoon Dr. Abran, this patient called and stated that she was needing to cancel her procedure due to her having a family emergency and her daughter being hospitalized. Patient procedure was scheduled for August the 18 th and was cancelled. Please advise.

## 2024-02-25 ENCOUNTER — Encounter: Admitting: Gastroenterology

## 2024-03-13 ENCOUNTER — Ambulatory Visit: Payer: Self-pay

## 2024-03-13 ENCOUNTER — Encounter: Payer: Self-pay | Admitting: Adult Health

## 2024-03-13 ENCOUNTER — Ambulatory Visit (INDEPENDENT_AMBULATORY_CARE_PROVIDER_SITE_OTHER): Admitting: Adult Health

## 2024-03-13 VITALS — BP 132/78 | HR 80 | Temp 98.1°F | Resp 18 | Ht 59.0 in | Wt 163.2 lb

## 2024-03-13 DIAGNOSIS — G629 Polyneuropathy, unspecified: Secondary | ICD-10-CM | POA: Diagnosis not present

## 2024-03-13 DIAGNOSIS — K219 Gastro-esophageal reflux disease without esophagitis: Secondary | ICD-10-CM

## 2024-03-13 DIAGNOSIS — I10 Essential (primary) hypertension: Secondary | ICD-10-CM | POA: Diagnosis not present

## 2024-03-13 DIAGNOSIS — E782 Mixed hyperlipidemia: Secondary | ICD-10-CM | POA: Diagnosis not present

## 2024-03-13 DIAGNOSIS — E1169 Type 2 diabetes mellitus with other specified complication: Secondary | ICD-10-CM

## 2024-03-13 MED ORDER — GABAPENTIN 100 MG PO CAPS: 100.0000 mg | ORAL_CAPSULE | Freq: Every day | ORAL | 3 refills | Status: AC

## 2024-03-13 NOTE — Progress Notes (Signed)
 [facility]  Provider:  Jereld Serum DNP  Code Status:  Full Code  Goals of Care:     01/04/2024    9:25 AM  Advanced Directives  Does Patient Have a Medical Advance Directive? No  Would patient like information on creating a medical advance directive? No - Patient declined     Chief Complaint  Patient presents with   Leg Pain    Discussed the use of AI scribe software for clinical note transcription with the patient, who gave verbal consent to proceed.  HPI: Patient is a 68 y.o. female seen today for an acute visit for left leg pain.  She has been experiencing left leg pain for over a month, rated as an eight out of ten. The pain began in the lower leg and has since moved upwards, now localized higher up and associated with swelling. The pain worsens during her work shifts, which involve a lot of walking and standing.  She has a history of arthritis, having undergone hip replacement surgery in 2020, and also has arthritis in the lower back. She has been taking over-the-counter Tylenol  for pain relief, but finds it ineffective and is not keen on continuing its use.  She has been informed of her elevated A1c levels. She struggles with drinking water .  Her current medications include rosuvastatin  20 mg daily for cholesterol, lisinopril -hydrochlorothiazide  20-12.5 mg daily for hypertension, and Protonix  20 mg daily for acid reflux. No smoking or alcohol use. She works full-time on the third shift as a Engineer, water, which involves significant physical activity.    Past Medical History:  Diagnosis Date   Acid reflux    [facility]   Alcohol use    Per [facility] received from [facility]- pt states she only has a States of wine occasionally   Arthritis    Knee and hip   BMI 36.0-36.9,adult    Per Records received from [facility]   Chronic knee pain    L & R   GERD (gastroesophageal reflux disease)    Heart murmur, systolic  09/19/2021   1/6 systolic   Hyperlipidemia    Hypertension    Morbid obesity (HCC)    Per Records received from [facility]   Other osteoarthritis involving multiple joints    Per Records received from [employer]   Prediabetes    Per [facility] received from [facility]   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   Vitamin D  deficiency    Per Records received from [facility]    Past Surgical History:  Procedure Laterality Date   COLONOSCOPY     JOINT REPLACEMENT Left 07/11/2018   knee replacement   TOE SURGERY Right    bone spur shaved down- many years ago   TOTAL HIP ARTHROPLASTY Right 09/21/2021   Procedure: RIGHT TOTAL HIP ARTHROPLASTY ANTERIOR APPROACH;  Surgeon: Jerri Kay HERO, MD;  Location: [facility] OR;  Service: Orthopedics;  Laterality: Right;   TUBAL LIGATION  1978   [facility]    No Known Allergies  Outpatient Encounter Medications as of 03/13/2024  Medication Sig   acetaminophen  (TYLENOL ) 500 MG tablet Take 500 mg by mouth as  needed.   Cholecalciferol (VITAMIN D3) 50 MCG (2000 UT) capsule Take 1 capsule (2,000 Units total) by mouth daily.   gabapentin  (NEURONTIN ) 100 MG capsule Take 1 capsule (100 mg total) by mouth at bedtime.   lisinopril -hydrochlorothiazide  (ZESTORETIC ) 20-12.5 MG tablet Take 1 tablet by mouth once daily   Multiple Vitamin (MULTIVITAMIN WITH MINERALS) TABS tablet Take 1 tablet by mouth daily.   pantoprazole  (PROTONIX ) 20 MG tablet Take 1 tablet by mouth once daily   rosuvastatin  (CRESTOR ) 20 MG tablet Take 1 tablet by mouth once daily   calcium  carbonate (CALCIUM  600) 600 MG TABS tablet Take 1 tablet (600 mg total) by mouth 2 (two) times daily with a meal. (Patient not taking: Reported on 03/13/2024)    hydrOXYzine  (ATARAX ) 25 MG tablet Take 1 tablet (25 mg total) by mouth every 8 (eight) hours as needed for itching. (Patient not taking: Reported on 03/13/2024)   No facility-administered encounter medications on file as of 03/13/2024.    Review of Systems:  Review of Systems  Constitutional:  Negative for appetite change, chills, fatigue and fever.  HENT:  Negative for congestion, hearing loss, rhinorrhea and sore throat.   Eyes: Negative.   Respiratory:  Negative for cough, shortness of breath and wheezing.   Cardiovascular:  Negative for chest pain, palpitations and leg swelling.  Gastrointestinal:  Negative for abdominal pain, constipation, diarrhea, nausea and vomiting.  Genitourinary:  Negative for dysuria.  Musculoskeletal:  Negative for arthralgias, back pain and myalgias.       Left leg pain  Skin:  Negative for color change, rash and wound.  Neurological:  Negative for dizziness, weakness and headaches.  Psychiatric/Behavioral:  Negative for behavioral problems. The patient is not nervous/anxious.     Health Maintenance  Topic Date Due   Diabetic kidney evaluation - Urine ACR  Never done   Colonoscopy  Never done   Zoster Vaccines- Shingrix (2 of 2) 01/14/2016   DTaP/Tdap/Td (2 - Tdap) 08/10/2016   DEXA SCAN  08/30/2023   Influenza Vaccine  02/08/2024   COVID-19 Vaccine (6 - 2024-25 season) 03/10/2024   Medicare Annual Wellness (AWV)  05/07/2024   Diabetic kidney evaluation - eGFR measurement  01/03/2025   MAMMOGRAM  06/05/2025   Pneumococcal Vaccine: 50+ Years  Completed   Hepatitis C Screening  Completed   HPV VACCINES  Aged Out   Meningococcal B Vaccine  Aged Out    Physical Exam: Vitals:   03/13/24 1027  BP: 132/78  Pulse: 80  Resp: 18  Temp: 98.1 F (36.7 C)  Weight: 163 lb 3.2 oz (74 kg)  Height: 4' 11 (1.499 m)   Body mass index is 32.96 kg/m. Physical Exam Constitutional:      Appearance: Normal appearance.  HENT:     Head: Normocephalic and  atraumatic.     Nose: Nose normal.     Mouth/Throat:     Mouth: Mucous membranes are moist.  Eyes:     Conjunctiva/sclera: Conjunctivae normal.  Cardiovascular:     Rate and Rhythm: Normal rate and regular rhythm.  Pulmonary:     Effort: Pulmonary effort is normal.     Breath sounds: Normal breath sounds.  Abdominal:     General: Bowel sounds are normal.     Palpations: Abdomen is soft.  Musculoskeletal:        General: Normal range of motion.     Cervical back: Normal range of motion.  Skin:    General: Skin is warm and dry.  Neurological:     General: No focal deficit present.     Mental Status: She is alert and oriented to person, place, and time.  Psychiatric:        Mood and Affect: Mood normal.        Behavior: Behavior normal.        Thought Content: Thought content normal.        Judgment: Judgment normal.     Labs reviewed: Basic Metabolic Panel: Recent Labs    06/04/23 1030 01/04/24 0954  NA 140 142  K 4.2 4.4  CL 107 107  CO2 25 27  GLUCOSE 97 92  BUN 15 12  CREATININE 0.77 0.71  CALCIUM  10.1 9.5   Liver Function Tests: Recent Labs    06/04/23 1030 01/04/24 0954  AST 18 16  ALT 12 12  BILITOT 0.3 0.3  PROT 7.4 6.9   No results for input(s): LIPASE, AMYLASE in the last 8760 hours. No results for input(s): AMMONIA in the last 8760 hours. CBC: Recent Labs    06/04/23 1030 01/04/24 0954  WBC 6.9 6.2  NEUTROABS 3,077 3,026  HGB 13.0 12.6  HCT 39.5 38.5  MCV 89.4 92.1  PLT 269 266   Lipid Panel: Recent Labs    06/04/23 1030 01/04/24 0954  CHOL 162 163  HDL 59 64  LDLCALC 79 81  TRIG 140 98  CHOLHDL 2.7 2.5   Lab Results  Component Value Date   HGBA1C 6.4 (H) 01/04/2024    Procedures since last visit: No results found.  Assessment/Plan  1. Neuropathy (Primary) -  will initiate Gabapentin  100 mg at bedtime - gabapentin  (NEURONTIN ) 100 MG capsule; Take 1 capsule (100 mg total) by mouth at bedtime.  Dispense: 30  capsule; Refill: 3  2. Type 2 diabetes mellitus with other specified complication, without long-term current use of insulin (HCC) Lab Results  Component Value Date   HGBA1C 6.4 (H) 01/04/2024    -  Advise on lifestyle modifications: 150 minutes of exercise per week, low carbohydrate, low fat diet. - Encourage increased water  intake, avoid sugary drinks including zero sugar beverages.  3. Mixed hyperlipidemia Lab Results  Component Value Date   CHOL 163 01/04/2024   HDL 64 01/04/2024   LDLCALC 81 01/04/2024   TRIG 98 01/04/2024   CHOLHDL 2.5 01/04/2024    -  Cholesterol levels well-controlled. - Continue rosuvastatin  20 mg daily.  4. Essential hypertension, benign -  Blood pressure well-controlled at 132/78 mmHg. - Continue lisinopril -hydrochlorothiazide  20-12.5 mg daily  5. Gastroesophageal reflux disease without esophagitis -  well-managed. - Continue Protonix  20 mg daily.      Labs/tests ordered:  None   Return if symptoms worsen or fail to improve.  Nikolus Marczak Medina-Vargas, NP

## 2024-03-13 NOTE — Telephone Encounter (Signed)
 FYI Only or Action Required?: Action required by provider: request for appointment.  Patient was last seen in primary care on 01/04/2024 by [name], NP.  Called Nurse Triage reporting Leg Pain.  Symptoms began long time.  Interventions attempted: OTC medications: tylenol .  Symptoms are: gradually worsening.  Triage Disposition: See HCP Within 4 Hours (Or PCP Triage)  Patient/caregiver understands and will follow disposition?: YesCopied from CRM [id number]. Topic: Clinical - Red Word Triage >> Mar 13, 2024  8:58 AM Miquel SAILOR wrote: Red Word that prompted transfer to Nurse Triage: LT leg pain for 1 month getting worse last 1-2 days Reason for Disposition  [1] SEVERE pain (e.g., excruciating, unable to do any normal activities) AND [2] not improved after 2 hours of pain medicine  Answer Assessment - Initial Assessment Questions Harlene wanted me to go to therapy but I can't. I'm too old. I can't take this tylenol  anymore. It is not helping. Pt is  having to lean on cart at work to walk.     1. ONSET: When did the pain start?      Long time 2. LOCATION: Where is the pain located?      Left leg 3. PAIN: How bad is the pain?    (Scale 1-10; or mild, moderate, severe)     severe 4. WORK OR EXERCISE: Has there been any recent work or exercise that involved this part of the body?      work 5. CAUSE: What do you think is causing the leg pain?     Not sure 6. OTHER SYMPTOMS: Do you have any other symptoms? (e.g., chest pain, back pain, breathing difficulty, swelling, rash, fever, numbness, weakness)     swelling  Protocols used: Leg Pain-A-AH

## 2024-03-13 NOTE — Telephone Encounter (Signed)
 Message routed to Jereld Delude, NP as RICK. Patient placed on schedule for today.

## 2024-03-20 ENCOUNTER — Telehealth: Payer: Self-pay

## 2024-03-20 ENCOUNTER — Ambulatory Visit: Payer: Self-pay

## 2024-03-20 ENCOUNTER — Ambulatory Visit
Admission: RE | Admit: 2024-03-20 | Discharge: 2024-03-20 | Disposition: A | Discharge status: Home / Self Care | Source: Ambulatory Visit | Attending: Adult Health | Admitting: Adult Health

## 2024-03-20 ENCOUNTER — Ambulatory Visit (INDEPENDENT_AMBULATORY_CARE_PROVIDER_SITE_OTHER): Admitting: Adult Health

## 2024-03-20 ENCOUNTER — Encounter: Payer: Self-pay | Admitting: Adult Health

## 2024-03-20 VITALS — BP 138/78 | HR 85 | Temp 98.2°F | Ht 59.0 in | Wt 162.2 lb

## 2024-03-20 DIAGNOSIS — E1169 Type 2 diabetes mellitus with other specified complication: Secondary | ICD-10-CM

## 2024-03-20 DIAGNOSIS — I10 Essential (primary) hypertension: Secondary | ICD-10-CM | POA: Diagnosis not present

## 2024-03-20 DIAGNOSIS — M79652 Pain in left thigh: Secondary | ICD-10-CM

## 2024-03-20 MED ORDER — DEXCOM G7 RECEIVER DEVI: 1.0000 | Freq: Every day | 0 refills | Status: DC

## 2024-03-20 MED ORDER — DEXCOM G7 SENSOR MISC: 1.0000 | Freq: Every day | 3 refills | Status: DC

## 2024-03-20 MED ORDER — METFORMIN HCL 500 MG PO TABS: 500.0000 mg | ORAL_TABLET | Freq: Two times a day (BID) | ORAL | 1 refills | Status: DC

## 2024-03-20 NOTE — Telephone Encounter (Signed)
 Noted, Looks like pt saw [name]. Will follow

## 2024-03-20 NOTE — Telephone Encounter (Signed)
 FYI Only or Action Required?: FYI only for provider.  Patient was last seen in primary care on 03/13/2024 by Medina-Vargas, Jereld BROCKS, NP.  Called Nurse Triage reporting Leg Pain. Upper leg - thinks it may be slightly swollen.  Symptoms began ongoing.  Interventions attempted: Prescription medications: gabapentin .  Symptoms are: unchanged.  Triage Disposition: See HCP Within 4 Hours (Or PCP Triage)  Patient/caregiver understands and will follow disposition?: Yes                     Copied from CRM #[id number]. Topic: Clinical - Red Word Triage >> Mar 20, 2024  8:36 AM Susanna ORN wrote: Red Word that prompted transfer to Nurse Triage: Patient states she came and saw Dr. Phyllis on the 4th for pain in left leg. States it started at the bottom of her leg but was given Gabapentin . States the meds has gotten into her system but now the pain is at the thigh part of her leg and she's in pain. Wants to know what does she need to do? Reason for Disposition  [1] Thigh or calf pain AND [2] only 1 side AND [3] present > 1 hour  (Exception: Chronic unchanged pain.)  Answer Assessment - Initial Assessment Questions 1. ONSET: When did the pain start?      Before the 4th 2. LOCATION: Where is the pain located?      Upper leg now - thigh 3. PAIN: How bad is the pain?    (Scale 1-10; or mild, moderate, severe)     Constant  - 10/10 4. WORK OR EXERCISE: Has there been any recent work or exercise that involved this part of the body?      no 5. CAUSE: What do you think is causing the leg pain?     Unsure - arthritis 6. OTHER SYMPTOMS: Do you have any other symptoms? (e.g., chest pain, back pain, breathing difficulty, swelling, rash, fever, numbness, weakness)     Might be swollen  Protocols used: Leg Pain-A-AH

## 2024-03-20 NOTE — Progress Notes (Signed)
 [facility] clinic  Provider:  Jereld Serum DNP  Code Status:  Full Code  Goals of Care:     01/04/2024    9:25 AM  Advanced Directives  Does Patient Have a Medical Advance Directive? No  Would patient like information on creating a medical advance directive? No - Patient declined     Chief Complaint  Patient presents with   Leg Pain    Left leg pain for about a month now   Discussed the use of AI scribe software for clinical note transcription with the patient, who gave verbal consent to proceed.  HPI: Patient is a 68 y.o. female seen today for an acute visit for left thigh pain.  She has been experiencing severe pain in her left thigh for approximately one month, initially starting lower in her leg and moving up to her thigh. The pain is currently rated as 8 out of 10, having been 10 out of 10 at its worst. There is no history of injury or trauma to the area. The pain significantly impacts her ability to work, as she tries to avoid using a cane at her job, although she sometimes needs it to get to work.  She has a history of diabetes with an A1c of 6.4 but is not currently on any medication for it and does not regularly check her blood sugar levels. She is taking lisinopril  and hydrochlorothiazide  for hypertension, with a blood pressure reading of 138/78 today. She uses Tylenol  for pain, although it does not provide significant relief.  In her social history, she works night shifts from 11:30 PM to 8:00 AM and has not slept in the past 24 hours. She does not smoke and only occasionally drinks alcohol. She has four living children, with one deceased due to a violent incident.        Past Medical History:  Diagnosis Date   Acid reflux    [facility]   Alcohol use    Per Records received from [facility]- pt states she only has a Jenifer of wine occasionally   Arthritis    Knee and hip   BMI 36.0-36.9,adult    Per Records received  from [facility]   Chronic knee pain    L & R   GERD (gastroesophageal reflux disease)    Heart murmur, systolic 09/19/2021   1/6 systolic   Hyperlipidemia    Hypertension    Morbid obesity (HCC)    Per Records received from [facility]   Other osteoarthritis involving multiple joints    Per Records received from [employer]   Prediabetes    Per Records received from [facility]   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[facility])    Per records from [employer] [facility]   Right hip pain    Per records from [employer] [facility]   Right thigh pain 2022   Per records from [employer] [facility] [place]   Vitamin D  deficiency    Per Records received from [facility]    Past Surgical History:  Procedure Laterality Date   COLONOSCOPY     JOINT REPLACEMENT Left 07/11/2018   knee replacement   TOE SURGERY Right    bone spur shaved down- many years ago   TOTAL HIP ARTHROPLASTY Right 09/21/2021   Procedure: RIGHT TOTAL HIP ARTHROPLASTY ANTERIOR APPROACH;  Surgeon: Jerri Kay HERO, MD;  Location: [facility] OR;  Service: Orthopedics;  Laterality: Right;  TUBAL LIGATION  1978   Per records from [facility]    No Known Allergies  Outpatient Encounter Medications as of 03/20/2024  Medication Sig   acetaminophen  (TYLENOL ) 500 MG tablet Take 500 mg by mouth as needed.   calcium  carbonate (CALCIUM  600) 600 MG TABS tablet Take 1 tablet (600 mg total) by mouth 2 (two) times daily with a meal.   Cholecalciferol (VITAMIN D3) 50 MCG (2000 UT) capsule Take 1 capsule (2,000 Units total) by mouth daily.   Continuous Glucose Receiver ([name] G7 RECEIVER) DEVI 1 Device by Does not apply route daily. E11.69   Continuous Glucose Sensor ([name] G7 SENSOR) MISC 1 Device by Does not apply route daily. E11.69   gabapentin  (NEURONTIN ) 100 MG capsule Take 1 capsule (100 mg total) by mouth at bedtime.   hydrOXYzine  (ATARAX ) 25 MG tablet Take 1 tablet (25 mg total) by  mouth every 8 (eight) hours as needed for itching.   lisinopril -hydrochlorothiazide  (ZESTORETIC ) 20-12.5 MG tablet Take 1 tablet by mouth once daily   metFORMIN  (GLUCOPHAGE ) 500 MG tablet Take 1 tablet (500 mg total) by mouth 2 (two) times daily with a meal.   Multiple Vitamin (MULTIVITAMIN WITH MINERALS) TABS tablet Take 1 tablet by mouth daily.   pantoprazole  (PROTONIX ) 20 MG tablet Take 1 tablet by mouth once daily   rosuvastatin  (CRESTOR ) 20 MG tablet Take 1 tablet by mouth once daily   No facility-administered encounter medications on file as of 03/20/2024.    Review of Systems:  Review of Systems  Constitutional:  Negative for appetite change, chills, fatigue and fever.  HENT:  Negative for congestion, hearing loss, rhinorrhea and sore throat.   Eyes: Negative.   Respiratory:  Negative for cough, shortness of breath and wheezing.   Cardiovascular:  Negative for chest pain, palpitations and leg swelling.  Gastrointestinal:  Negative for abdominal pain, constipation, diarrhea, nausea and vomiting.  Genitourinary:  Negative for dysuria.  Musculoskeletal:  Positive for arthralgias. Negative for back pain and myalgias.       Left thigh pain  Skin:  Negative for color change, rash and wound.  Neurological:  Negative for dizziness, weakness and headaches.  Psychiatric/Behavioral:  Negative for behavioral problems. The patient is not nervous/anxious.     Health Maintenance  Topic Date Due   Diabetic kidney evaluation - Urine ACR  Never done   Colonoscopy  Never done   Zoster Vaccines- Shingrix (2 of 2) 01/14/2016   DTaP/Tdap/Td (2 - Tdap) 08/10/2016   DEXA SCAN  08/30/2023   Influenza Vaccine  02/08/2024   COVID-19 Vaccine (6 - 2024-25 season) 03/10/2024   Medicare Annual Wellness (AWV)  05/07/2024   Diabetic kidney evaluation - eGFR measurement  01/03/2025   Mammogram  06/05/2025   Pneumococcal Vaccine: 50+ Years  Completed   Hepatitis C Screening  Completed   HPV VACCINES  Aged  Out   Meningococcal B Vaccine  Aged Out    Physical Exam: Vitals:   03/20/24 1327  BP: 138/78  Pulse: 85  Temp: 98.2 F (36.8 C)  SpO2: 98%  Weight: 162 lb 3.2 oz (73.6 kg)  Height: 4' 11 (1.499 m)   Body mass index is 32.76 kg/m. Physical Exam Constitutional:      Appearance: She is obese.  HENT:     Head: Normocephalic and atraumatic.     Nose: Nose normal.     Mouth/Throat:     Mouth: Mucous membranes are moist.  Eyes:  Conjunctiva/sclera: Conjunctivae normal.  Cardiovascular:     Rate and Rhythm: Normal rate and regular rhythm.  Pulmonary:     Effort: Pulmonary effort is normal.     Breath sounds: Normal breath sounds.  Abdominal:     General: Bowel sounds are normal.     Palpations: Abdomen is soft.  Musculoskeletal:        General: Normal range of motion.     Cervical back: Normal range of motion.  Skin:    General: Skin is warm and dry.  Neurological:     General: No focal deficit present.     Mental Status: She is alert and oriented to person, place, and time.  Psychiatric:        Mood and Affect: Mood normal.        Behavior: Behavior normal.        Thought Content: Thought content normal.        Judgment: Judgment normal.     Labs reviewed: Basic Metabolic Panel: Recent Labs    06/04/23 1030 01/04/24 0954  NA 140 142  K 4.2 4.4  CL 107 107  CO2 25 27  GLUCOSE 97 92  BUN 15 12  CREATININE 0.77 0.71  CALCIUM  10.1 9.5   Liver Function Tests: Recent Labs    06/04/23 1030 01/04/24 0954  AST 18 16  ALT 12 12  BILITOT 0.3 0.3  PROT 7.4 6.9   No results for input(s): LIPASE, AMYLASE in the last 8760 hours. No results for input(s): AMMONIA in the last 8760 hours. CBC: Recent Labs    06/04/23 1030 01/04/24 0954  WBC 6.9 6.2  NEUTROABS 3,077 3,026  HGB 13.0 12.6  HCT 39.5 38.5  MCV 89.4 92.1  PLT 269 266   Lipid Panel: Recent Labs    06/04/23 1030 01/04/24 0954  CHOL 162 163  HDL 59 64  LDLCALC 79 81  TRIG 140  98  CHOLHDL 2.7 2.5   Lab Results  Component Value Date   HGBA1C 6.4 (H) 01/04/2024    Procedures since last visit: No results found.  Assessment/Plan  1. Left thigh pain (Primary) -   left thigh pain for one month, severity 8/10. Good pulse reduces vascular concern. - Order X-ray of the left femur. - Continue Tylenol  for pain management. - DG Hip Unilat W OR W/O Pelvis 2-3 Views Left - DG FEMUR MIN 2 VIEWS LEFT  2. Type 2 diabetes mellitus with other specified complication, without long-term current use of insulin (HCC) -  Newly diagnosed with A1c of 6.4%. Discussed and agreed to start metformin . - Start metformin  500 mg twice daily. - Order blood sugar monitor, strips, and lancets. - Provide log for blood sugar recording. - Order urine microalbumin test. - metFORMIN  (GLUCOPHAGE ) 500 MG tablet; Take 1 tablet (500 mg total) by mouth 2 (two) times daily with a meal.  Dispense: 60 tablet; Refill: 1 - Continuous Glucose Receiver ([name] G7 RECEIVER) DEVI; 1 Device by Does not apply route daily. E11.69  Dispense: 1 each; Refill: 0 - Continuous Glucose Sensor ([name] G7 SENSOR) MISC; 1 Device by Does not apply route daily. E11.69  Dispense: 6 each; Refill: 3  3. Essential hypertension, benign -   controlled with lisinopril -hydrochlorothiazide . Current BP 138/78 mmHg. Discussed home monitoring. - Recommend home blood pressure monitor. - Advise daily BP recording at rest.      Labs/tests ordered:  x-ray of left femur and left hip   Return if symptoms worsen or fail to  improve.  Joua Bake Medina-Vargas, NP

## 2024-03-20 NOTE — Telephone Encounter (Signed)
 Incoming fax received to initiate prior authorization via covermymed. Prior authorization was initiated and I was unable to complete process as the following message populated:      I called the number listed and re-initiated prior authorization over the phone. Information was forwarded to the pharmacist for review and I am awaiting fax with clinical questions

## 2024-03-24 ENCOUNTER — Other Ambulatory Visit: Payer: Self-pay | Admitting: Adult Health

## 2024-03-24 ENCOUNTER — Ambulatory Visit: Payer: Self-pay | Admitting: Adult Health

## 2024-03-24 DIAGNOSIS — M1612 Unilateral primary osteoarthritis, left hip: Secondary | ICD-10-CM

## 2024-03-24 MED ORDER — MELOXICAM 7.5 MG PO TABS: 7.5000 mg | ORAL_TABLET | Freq: Every day | ORAL | 0 refills | Status: DC | PRN

## 2024-03-24 NOTE — Telephone Encounter (Signed)
 Coverage for [name] was denied (see letter under media. Mychart message sent to patient informing her to contact the insurance company to find out which machine they will cover.

## 2024-03-24 NOTE — Progress Notes (Signed)
-    left hip has osteoarthritis, sent eRx for Mobic  PRN for pain

## 2024-03-25 NOTE — Progress Notes (Signed)
 You got the [facility] receiver and will need the [name] sensor which you are waiting for.

## 2024-03-26 ENCOUNTER — Telehealth: Payer: Self-pay

## 2024-03-26 NOTE — Telephone Encounter (Signed)
 She can use gabapenin with meloxicam  if it helps with pain To use meloxiam sparingly as it can effect her kidney function and gut health Always take with food

## 2024-03-26 NOTE — Telephone Encounter (Signed)
 Message routed to PCP Roselie Conger, Champ Coma, NP . Please advise.

## 2024-03-26 NOTE — Telephone Encounter (Signed)
 Patient called and notified.

## 2024-03-26 NOTE — Telephone Encounter (Signed)
 Copied from CRM #[id number]. Topic: Clinical - Medication Question >> Mar 26, 2024  8:24 AM Susanna ORN wrote: Reason for CRM: Patient wants to know if Dr. Phyllis her to still take the gabapentin  100 mg along with the Meloxicam  that she was just prescribed? Please give patient a call back to advise. CB #: [phone].

## 2024-03-31 ENCOUNTER — Telehealth: Payer: Self-pay

## 2024-03-31 ENCOUNTER — Other Ambulatory Visit: Payer: Self-pay | Admitting: Adult Health

## 2024-03-31 NOTE — Telephone Encounter (Signed)
 Message routed to both Jereld Delude, NP and medical assistant Mihesha.W/CMA from office visit 03/20/2024. I believe Transmitter is what's missing. Also email in outlook was forwarded with instructions for [name] ordering.

## 2024-03-31 NOTE — Telephone Encounter (Signed)
 In [name] G7 (which was ordered) the sensor and the transmitter are together. Pls call pharmacy what is missing for her to use [name] G7.

## 2024-03-31 NOTE — Telephone Encounter (Signed)
 Copied from CRM #[id number]. Topic: Clinical - Prescription Issue >> Mar 31, 2024 10:25 AM Farrel B wrote: Reason for CRM:  Continuous Glucose Receiver ([name] G7 RECEIVER) DEVI Continuous Glucose Sensor ([name] G7 SENSOR) MISC Patient states she is unable to check her sugar the pharmacist told her something was missing within this setup patient states she has not even opened the box but if she doesn't have everything how can she check her sugar Please call to advise!

## 2024-04-01 NOTE — Telephone Encounter (Signed)
 I have called back the number provided and it states that additional information is needed for patient. We have received fax and I have placed in Stephanie Delude, NP review and sign folder for completion. Message routed as FYI.

## 2024-04-01 NOTE — Telephone Encounter (Signed)
 Copied from CRM [id number]. Topic: General - Call Back - No Documentation >> Apr 01, 2024  1:22 PM Merlynn A wrote: Reason for CRM: Asberry from the [facility] pre cert department called in returning call from [place]. Please contact her back at [phone] for further assistance.

## 2024-04-01 NOTE — Telephone Encounter (Signed)
 Noted. Thanks.

## 2024-04-01 NOTE — Telephone Encounter (Signed)
 I called the pharmacy and they stated that patient insurance covered Sensor but not device. I got patient insurance number [phone] from the pharmacy and called them. They notified me that patient [name] was sent to Part B which is medical plan and Part D is prescription and they covered Sensor. Right now they are sending everything to Part D to see if they will cover Prior Authorization. The main reason why this was previously denied was because they said she wasn't insulin dependent. I was told that 72hours will be the wait period for patient Prior Authorization. Case ID: [id number]. Message routed back to Jereld Delude, NP as RICK. Phone conversation already was discussed with provider regarding patient.

## 2024-04-01 NOTE — Telephone Encounter (Signed)
 The only thing that was ordered was Receiver, and Sensor not the Transmitter. I forwarded email to both you and Mihesha/CMA for the directions on order [name] that was previously sent by Chare.B/CMA. Message routed back to Jereld Delude, NP.

## 2024-04-02 ENCOUNTER — Ambulatory Visit: Payer: Self-pay

## 2024-04-02 ENCOUNTER — Telehealth: Payer: Self-pay

## 2024-04-02 NOTE — Telephone Encounter (Signed)
 Patient disconnected call after warm transfer

## 2024-04-02 NOTE — Telephone Encounter (Signed)
 Copied from CRM [id number]. Topic: Clinical - Medical Advice >> Apr 02, 2024  9:15 AM [name] wrote: Reason for CRM: Patient is calling because she is needing to speak with the provider about her job and her working. Could you assist? Patients callback number is [phone].

## 2024-04-02 NOTE — Telephone Encounter (Signed)
 Left message on voicemail for patient to return call when available. I emphasized that the best for of communication would be a Clinical cytogeneticist message of scheduling a video visit with [name], NP to avoid missing each others phone call.

## 2024-04-02 NOTE — Telephone Encounter (Signed)
 FYI Only or Action Required?: Action required by provider: clinical question for provider.  Patient was last seen in primary care on 03/20/2024 by Medina-Vargas, Jereld BROCKS, NP.  Called Nurse Triage reporting Headache.  Symptoms began several days ago.  Interventions attempted: Nothing.  Symptoms are: stable.  Triage Disposition: No disposition on file.  Patient/caregiver understands and will follow disposition?:   Copied from CRM [id number]. Topic: Clinical - Red Word Triage >> Apr 02, 2024  4:08 PM Zane F wrote: Red Word that prompted transfer to Nurse Triage:   Concern: Head hurts; not feeling like herself  Patient's sister was recently diagnosed with covid and just returned to work today.    When did the symptoms start?: just started a moment ago   What have you done to aid in the concern ? Have you taken anything to assist with the matter?: No   If so, what did you take?:    Wanted to let you know I will be transferring you to further discuss your concern. Please be advised the nurse can assist with scheduling. Answer Assessment - Initial Assessment Questions Arthirits in left hip, called office to talk with Harlene NP; was told I need to quit job, due to it being physical.  2. ONSET: When did the headache start? (e.g., minutes, hours, days)      I don't have a headache.Feel like I'm trying to catch a headache, I have not took any medication.  Patient reports been exposed to Covid, sister had it.  I just need a video call, is it best for me not to work and try disability?  I did not take medications today and unsure of BP.  Answer Assessment - Initial Assessment Questions Patient is requesting a virtual visit with Harlene NP to discuss work related questions and disability. No available appt until Oct. 24, pt requesting sooner appt and CALL BACK.  Nurse attempted to triage patient regarding HA, pt reports does not have HA and denies any symptoms.   1. REASON  FOR CALL: What is the main reason for your call? or How can I best help you?     I just need a video call, is it best for me not to work and try disability? 2. SYMPTOMS : Do you have any symptoms?      I don't have a headache.Feel like I'm trying to catch a headache, I have not took any medications today; did not take BP and unsure of BP.  Patient reports been exposed to Covid, sister had it.  3. OTHER QUESTIONS: Do you have any other questions?  Arthritis in left hip, called office to talk with Harlene NP; was told I need to quit job, due to it being physical.  Protocols used: Headache-A-AH, Information Only Call - No Triage-A-AH

## 2024-04-02 NOTE — Telephone Encounter (Signed)
 Prior authorization completed and given to Jasmine CMA) to fax.

## 2024-04-03 ENCOUNTER — Ambulatory Visit: Admitting: Orthopaedic Surgery

## 2024-04-03 DIAGNOSIS — M1612 Unilateral primary osteoarthritis, left hip: Secondary | ICD-10-CM

## 2024-04-03 MED ORDER — TRAMADOL HCL 50 MG PO TABS: 50.0000 mg | ORAL_TABLET | Freq: Every day | ORAL | 0 refills | Status: AC | PRN

## 2024-04-03 NOTE — Progress Notes (Signed)
 Office Visit Note   Patient: [name]           Date of Birth: [birth year]           MRN: [id number] Visit Date: 04/03/2024              Requested by: [name], NP [address] [place]. [place],  [place] [address] PCP: [name], NP   Assessment & Plan: Visit Diagnoses:  1. Primary osteoarthritis of left hip     Plan: History of Present Illness [name] is a 68 year old female with hip arthritis who presents with left hip pain.  She experiences significant pain in her left hip, which has been persistent and affects her daily activities. The pain started at the bottom of her leg and moved upwards to her thigh, causing her to miss work. An x-ray of her hip has been performed. She has been taking over-the-counter Tylenol  for many years, but it is no longer effective. Recently, she started naproxen  and meloxicam , which provide some relief but are insufficient to fully manage her symptoms. She is not on any blood thinners.  Physical Exam MUSCULOSKELETAL: Left hip not tender on palpation. Left hip pain on flexion and internal rotation.  Positive Stinchfield sign.  Results RADIOLOGY Hip X-ray: Advanced arthritic changes in the left hip joint (03/20/2024)  Assessment and Plan Left hip osteoarthritis Chronic left hip osteoarthritis with significant arthritic changes. Pain management with acetaminophen  ineffective, meloxicam  minimally effective. Potential need for hip replacement if conservative measures fail. - Schedule cortisone injection with Doctor Burnetta. - Prescribe tramadol  for pain management.  Follow-Up Instructions: No follow-ups on file.   Orders:  No orders of the defined types were placed in this encounter.  Meds ordered this encounter  Medications   traMADol  (ULTRAM ) 50 MG tablet    Sig: Take 1-2 tablets (50-100 mg total) by mouth daily as needed.    Dispense:  20 tablet    Refill:  0      Procedures: No procedures performed   Clinical  Data: No additional findings.   Subjective: Chief Complaint  Patient presents with   Left Hip - Pain    HPI  Review of Systems  Constitutional: Negative.   HENT: Negative.    Eyes: Negative.   Respiratory: Negative.    Cardiovascular: Negative.   Endocrine: Negative.   Musculoskeletal: Negative.   Neurological: Negative.   Hematological: Negative.   Psychiatric/Behavioral: Negative.    All other systems reviewed and are negative.    Objective: Vital Signs: There were no vitals taken for this visit.  Physical Exam Vitals and nursing note reviewed.  Constitutional:      Appearance: She is well-developed.  HENT:     Head: Atraumatic.     Nose: Nose normal.  Eyes:     Extraocular Movements: Extraocular movements intact.  Cardiovascular:     Pulses: Normal pulses.  Pulmonary:     Effort: Pulmonary effort is normal.  Abdominal:     Palpations: Abdomen is soft.  Musculoskeletal:     Cervical back: Neck supple.  Skin:    General: Skin is warm.     Capillary Refill: Capillary refill takes less than 2 seconds.  Neurological:     Mental Status: She is alert. Mental status is at baseline.  Psychiatric:        Behavior: Behavior normal.        Thought Content: Thought content normal.  Judgment: Judgment normal.     Ortho Exam  Specialty Comments:  Narrative & Impression CLINICAL DATA:  Left-sided low back pain over the last several months.   EXAM: MRI LUMBAR SPINE WITHOUT CONTRAST   TECHNIQUE: Multiplanar, multisequence MR imaging of the lumbar spine was performed. No intravenous contrast was administered.   COMPARISON:  Radiography 11/02/2022   FINDINGS: Segmentation:  5 lumbar type vertebral bodies.   Alignment:  Normal   Vertebrae: No fracture or focal bone lesion. Edematous change of the facet joints on the right at L3-4 and L4-5.   Conus medullaris and cauda equina: Conus extends to the L1 level. Conus and cauda equina appear normal.    Paraspinal and other soft tissues: Negative   Disc levels:   No abnormality from T12-L1 through L2-3.   L3-4: No disc abnormality. 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 No encroachment upon the neural structures. This could relate to back pain or referred facet syndrome pain.   L4-5: Minimal disc bulge. Bilateral facet osteoarthritis, worse on the right. No apparent compressive stenosis. Findings could relate to back pain or referred facet syndrome pain.   L5-S1: No disc abnormality. Minimal facet hypertrophy without visible edema. No stenosis.   IMPRESSION: 1. 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 No encroachment upon the neural structures. This could relate to back pain or referred facet syndrome pain. Mild bulging of the L4-5 disc.     Electronically Signed   By: Oneil Officer M.D.   On: 05/28/2023 10:58  Imaging: No results found.   PMFS History: Patient Active Problem List   Diagnosis Date Noted   Chronic radicular pain of lower back 01/09/2024   Hyperglycemia 01/09/2024   Acute gout due to renal impairment involving toe of left foot 06/11/2023   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   Hyperlipidemia 08/18/2021   Hypertension 08/18/2021   Obesity 08/18/2021   Primary osteoarthritis of right hip 03/24/2021   Trigger finger, acquired 08/12/2009   Anxiety state 05/20/2009   CANDIDIASIS, SKIN 03/12/2009   TINGLING 12/24/2008   CHEST PAIN 02/03/2008   HYPOKALEMIA 11/07/2007   Other specified disorders of bladder 09/26/2007   Headache 08/07/2007   Constipation 07/23/2007   MICROSCOPIC HEMATURIA 07/23/2007   Dental caries 05/27/2007   TINEA PEDIS 05/23/2007   Allergic rhinitis 05/23/2007   GERD 05/23/2007   Diaphragmatic hernia 05/23/2007   DEGENERATIVE JOINT DISEASE, LEFT KNEE 05/23/2007   Essential hypertension, benign 05/20/2007   Past Medical History:  Diagnosis Date   Acid reflux    [facility]   Alcohol use    Per [facility] received from [facility]- pt states she only has a Fraizer of wine occasionally   Arthritis    Knee and hip   BMI 36.0-36.9,adult    Per [facility]   Chronic knee pain    L & R   GERD (gastroesophageal reflux disease)    Heart murmur, systolic 09/19/2021   1/6 systolic   Hyperlipidemia    Hypertension    Morbid obesity (HCC)    Per Records received from [facility]   Other osteoarthritis involving multiple joints    Per Records received from [employer]   Prediabetes    Per Records received from [facility]   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[facility])    Per records from [employer] [facility] the [facility]   Right hip pain    Per records  from [facility]   Right thigh pain 2022   Per records from [employer] of the [facility]   Vitamin D  deficiency    Per Records received from [facility]    Family History  Problem Relation Age of Onset   Colon polyps Mother    Hypertension Mother        Per records from [facility] [place]   Cancer Mother 46       colon   Colon cancer Mother        Per records from [facility] [place]   Cancer Father 72       lung   Hypertension Father        Per records from [facility] [place]   Lung disease Father        Per records from [employer] of the [facility]   Seizures Brother    Epilepsy Brother    Drug abuse Daughter    Other Daughter        Murder in 2014   Stomach cancer Neg Hx    Rectal cancer Neg Hx    Esophageal cancer Neg Hx     Past Surgical History:  Procedure Laterality Date   COLONOSCOPY     JOINT REPLACEMENT Left 07/11/2018   knee replacement   TOE SURGERY Right    bone spur shaved down- many years ago   TOTAL HIP ARTHROPLASTY Right 09/21/2021   Procedure: RIGHT TOTAL HIP ARTHROPLASTY ANTERIOR APPROACH;  Surgeon: Jerri Kay HERO, MD;  Location: [facility] OR;   Service: Orthopedics;  Laterality: Right;   TUBAL LIGATION  1978   Per records from [facility]   Social History   Occupational History   Not on file  Tobacco Use   Smoking status: Never   Smokeless tobacco: Never  Vaping Use   Vaping status: Never Used  Substance and Sexual Activity   Alcohol use: Yes    Comment: pt states she may have 1 Behrendt of wine on special occasions   Drug use: No   Sexual activity: Not on file

## 2024-04-03 NOTE — Telephone Encounter (Signed)
 Mychart message sent to patient.

## 2024-04-03 NOTE — Telephone Encounter (Signed)
 I have availability next Tuesday morning, also she can try to call back tomorrow morning to see if there are any cancellations

## 2024-04-04 ENCOUNTER — Telehealth: Admitting: Nurse Practitioner

## 2024-04-04 ENCOUNTER — Encounter: Payer: Self-pay | Admitting: Nurse Practitioner

## 2024-04-04 DIAGNOSIS — M1612 Unilateral primary osteoarthritis, left hip: Secondary | ICD-10-CM

## 2024-04-04 DIAGNOSIS — E1169 Type 2 diabetes mellitus with other specified complication: Secondary | ICD-10-CM | POA: Diagnosis not present

## 2024-04-04 NOTE — Progress Notes (Signed)
 Careteam: Patient Care Team: Stephanie Harlene POUR, NP as PCP - General (Geriatric Medicine)  Advanced Directive information Does Patient Have a Medical Advance Directive?: No, Would patient like information on creating a medical advance directive?: No - Patient declined  No Known Allergies  Chief Complaint  Patient presents with   Osteoarthritis    Patient complains of left hip pain.     HPI: Patient is a 68 y.o. female virtual visit due to pain.   Discussed the use of AI scribe software for clinical note transcription with the patient, who gave verbal consent to proceed.  History of Present Illness [name] is a 68 year old female with diabetes who presents with left hip pain.  She experiences left hip pain that initially started at the bottom of her leg and has since moved to her left thigh and hip. The pain is significant enough to cause her to miss several days of work. An x-ray revealed arthritis in her left hip. She has previously had her right hip evaluated and treated by Dr. Jerri. She recalls that Dr. Patria discussed the possibility of a cortisone shot for her arthritis and pain.  The pain in her left hip has impacted her ability to perform her job, which requires physical strength. She has been in discussions with her supervisor about her work situation, including the possibility of taking short-term disability or reducing her work hours. She is concerned about losing short-term disability benefits if she reduces her work hours to three days a week.  She is diagnosed with diabetes and is currently taking metformin  500 mg twice daily. Her diabetes is controlled, and she is mindful of her diet, limiting sugary foods and sweets. She has recently acquired a device to monitor her blood pressure and blood sugar levels but encountered issues with missing components needed for the device's setup. Which have been prescribed.   She is on Medicare and does not have insurance through  her job due to high costs. She is considering her options regarding work and health benefits, including discussions with her human resources team.   Review of Systems:  Review of Systems  Constitutional:  Negative for chills and fever.  Respiratory:  Negative for shortness of breath.   Cardiovascular:  Negative for chest pain.  Musculoskeletal:  Positive for joint pain.  Psychiatric/Behavioral:  Negative for depression and suicidal ideas.     Past Medical History:  Diagnosis Date   Acid reflux    Per records [facility]   Alcohol use    Per Records received from [facility]- pt states she only has a Delbene of wine occasionally   Arthritis    Knee and hip   BMI 36.0-36.9,adult    Per Records received from [facility]   Chronic knee pain    L & R   GERD (gastroesophageal reflux disease)    Heart murmur, systolic 09/19/2021   1/6 systolic   Hyperlipidemia    Hypertension    Morbid obesity (HCC)    Per Records received from [facility]   Other osteoarthritis involving multiple joints    Per Records received from 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[facility])    Per records from [employer] [facility] the [facility]   Right hip pain    Per records from [employer] of the [facility]   Right thigh pain 2022  Per records from [facility]   Vitamin D  deficiency    Per Records received from [facility]   Past Surgical History:  Procedure Laterality Date   COLONOSCOPY     JOINT REPLACEMENT Left 07/11/2018   knee replacement   TOE SURGERY Right    bone spur shaved down- many years ago   TOTAL HIP ARTHROPLASTY Right 09/21/2021   Procedure: RIGHT TOTAL HIP ARTHROPLASTY ANTERIOR APPROACH;  Surgeon: [name] Kay HERO, MD;  Location: [facility] OR;  Service: Orthopedics;  Laterality: Right;   TUBAL LIGATION  1978   Per records from [facility]    Social History:   reports that she has never smoked. She has never used smokeless tobacco. She reports current alcohol use. She reports that she does not use drugs.  Family History  Problem Relation Age of Onset   Colon polyps Mother    Hypertension Mother        Per records from [facility]   Cancer Mother 9       colon   Colon cancer Mother        Per records from [facility] [place]   Cancer Father 72       lung   Hypertension Father        Per records from [facility]   Lung disease Father        Per records from [employer] [facility] the [facility]   Seizures Brother    Epilepsy Brother    Drug abuse Daughter    Other Daughter        Murder in 2014   Stomach cancer Neg Hx    Rectal cancer Neg Hx    Esophageal cancer Neg Hx     Medications: Patient's Medications  New Prescriptions   No medications on file  Previous Medications   ACETAMINOPHEN  (TYLENOL ) 500 MG TABLET    Take 500 mg by mouth as needed.   CALCIUM  CARBONATE (CALCIUM  600) 600 MG TABS TABLET    Take 1 tablet (600 mg total) by mouth 2 (two) times daily with a meal.   CHOLECALCIFEROL (VITAMIN D3) 50 MCG (2000 UT) CAPSULE    Take 1 capsule (2,000 Units total) by mouth daily.   CONTINUOUS GLUCOSE RECEIVER ([name] G7 RECEIVER) DEVI    1 Device by Does not apply route daily. E11.69   CONTINUOUS GLUCOSE SENSOR ([name] G7 SENSOR) MISC    1 Device by Does not apply route daily. E11.69   GABAPENTIN  (NEURONTIN ) 100 MG CAPSULE    Take 1 capsule (100 mg total) by mouth at bedtime.   HYDROXYZINE  (ATARAX ) 25 MG TABLET    Take 1 tablet (25 mg total) by mouth every 8 (eight) hours as needed for itching.   LISINOPRIL -HYDROCHLOROTHIAZIDE  (ZESTORETIC ) 20-12.5 MG TABLET    Take 1 tablet by mouth once daily   MELOXICAM  (MOBIC ) 7.5 MG TABLET    Take 1 tablet (7.5 mg total) by mouth daily as needed for pain. Take with food   METFORMIN  (GLUCOPHAGE ) 500 MG  TABLET    Take 1 tablet (500 mg total) by mouth 2 (two) times daily with a meal.   MULTIPLE VITAMIN (MULTIVITAMIN WITH MINERALS) TABS TABLET    Take 1 tablet by mouth daily.   PANTOPRAZOLE  (PROTONIX ) 20 MG TABLET    Take 1 tablet by mouth once daily   ROSUVASTATIN  (CRESTOR ) 20 MG TABLET  Take 1 tablet by mouth once daily   TRAMADOL  (ULTRAM ) 50 MG TABLET    Take 1-2 tablets (50-100 mg total) by mouth daily as needed.  Modified Medications   No medications on file  Discontinued Medications   No medications on file    Physical Exam:  There were no vitals filed for this visit. There is no height or weight on file to calculate BMI. Wt Readings from Last 3 Encounters:  03/20/24 162 lb 3.2 oz (73.6 kg)  03/13/24 163 lb 3.2 oz (74 kg)  01/17/24 165 lb (74.8 kg)    Physical Exam Constitutional:      Appearance: Normal appearance.  Pulmonary:     Effort: Pulmonary effort is normal.  Neurological:     Mental Status: She is alert. Mental status is at baseline.  Psychiatric:        Mood and Affect: Mood normal.     Labs reviewed: Basic Metabolic Panel: Recent Labs    06/04/23 1030 01/04/24 0954  NA 140 142  K 4.2 4.4  CL 107 107  CO2 25 27  GLUCOSE 97 92  BUN 15 12  CREATININE 0.77 0.71  CALCIUM  10.1 9.5   Liver Function Tests: Recent Labs    06/04/23 1030 01/04/24 0954  AST 18 16  ALT 12 12  BILITOT 0.3 0.3  PROT 7.4 6.9   No results for input(s): LIPASE, AMYLASE in the last 8760 hours. No results for input(s): AMMONIA in the last 8760 hours. CBC: Recent Labs    06/04/23 1030 01/04/24 0954  WBC 6.9 6.2  NEUTROABS 3,077 3,026  HGB 13.0 12.6  HCT 39.5 38.5  MCV 89.4 92.1  PLT 269 266   Lipid Panel: Recent Labs    06/04/23 1030 01/04/24 0954  CHOL 162 163  HDL 59 64  LDLCALC 79 81  TRIG 140 98  CHOLHDL 2.7 2.5   TSH: No results for input(s): TSH in the last 8760 hours. A1C: Lab Results  Component Value Date   HGBA1C 6.4 (H) 01/04/2024      Assessment/Plan Assessment and Plan Assessment & Plan Left hip osteoarthritis Chronic left hip osteoarthritis with significant pain impacting daily activities and work. Previous right hip replacement by Dr. Jerri. Current management includes cortisone injection consideration and potential future hip replacement. - Consider cortisone injection for pain management. - Discuss potential future hip replacement with Dr. Jerri. - Evaluate work options to reduce physical strain.   Type 2 diabetes mellitus, controlled Type 2 diabetes mellitus well-controlled with metformin . Advised to monitor blood glucose and maintain dietary modifications. - Continue metformin  500 mg twice daily. - Advise to limit sugary foods. - Ensure continuous glucose monitoring system is set up with pharmacy assistance.  05/13/2024 for follow up [name]  [facility] [phone]    Virtual Visit via video  I connected with patient on 04/04/24 at  9:20 AM EDT by mychart and verified that I am speaking with the correct person using two identifiers.  Location: Patient: car- not driving Provider: [facility]   I discussed the limitations, risks, security and privacy concerns of performing an evaluation and management service by telephone and the availability of in person appointments. I also discussed with the patient that there may be a patient responsible charge related to this service. The patient expressed understanding and agreed to proceed.   I discussed the assessment and treatment plan with the patient. The patient was provided an opportunity to ask  questions and all were answered. The patient agreed with the plan and demonstrated an understanding of the instructions.   The patient was advised to call back or seek an in-person evaluation if the symptoms worsen or if the condition fails to improve as anticipated.  I provided 15 minutes of non-face-to-face time during  this encounter.  Maigan Bittinger K. [name] Avs printed and mailed

## 2024-04-04 NOTE — Progress Notes (Signed)
   This service is provided via telemedicine  No vital signs collected/recorded due to the encounter was a telemedicine visit.   Location of patient (ex: home, work):  [employer] [place]- patient is parked and not driving  Patient consents to a telephone visit:  Yes  Location of the provider (ex: office, home):  [employer]   Name of any referring provider:  [name], NP   Names of all persons participating in the telemedicine service and their role in the encounter:  Patient, Rolin All, RMA, Harlene Caro, NP.    Time spent on call:  8 minutes spent on the phone with Medical Assistant.

## 2024-04-14 ENCOUNTER — Telehealth: Payer: Self-pay | Admitting: Nurse Practitioner

## 2024-04-14 ENCOUNTER — Telehealth: Payer: Self-pay | Admitting: Pharmacist

## 2024-04-14 DIAGNOSIS — R739 Hyperglycemia, unspecified: Secondary | ICD-10-CM

## 2024-04-14 NOTE — Telephone Encounter (Signed)
 Copied from CRM [id number]. Topic: Clinical - Medication Question >> Apr 14, 2024  2:26 PM Debby BROCKS wrote: Reason for CRM: Patient passed by the pharmacy and picked up her G7 to check her A1 but was advised that its missing the sensor. Someone at the pharmacy told her the insurance isnt paying for it anymore and now she just has a G7 that she can't use and would like some help as to what she should do

## 2024-04-15 ENCOUNTER — Other Ambulatory Visit (HOSPITAL_COMMUNITY): Payer: Self-pay

## 2024-04-15 NOTE — Progress Notes (Addendum)
   04/15/2024  Patient ID: Vina DELENA Amel, female   DOB: [birth year], 68 y.o.   MRN: [id number]  Received message that Patient was having issues getting [name] G7 filled. Called Patient. Unfortunately, she did not answer the phone. HIPAA compliant message was left on her voicemail.    Upon investigation of the Patient's fill history, [name] G7 sensors were filled on 03/20/24 for a 60 day supply.  As such, the Patient's insurance is saying it is too soon to fill.  [name] is available for fill on 05/04/24.  Lab Results  Component Value Date   HGBA1C 6.4 (H) 01/04/2024   HGBA1C 6.3 (H) 06/04/2023   HGBA1C 6.2 (H) 05/22/2022     Plan: Send message back to PCP. Await a call back from the Patient.  ADDENDUM  Called Patient back from where she called me. No answer. Left another HIPAA compliant message.   Cassius DOROTHA Brought, PharmD, BCACP Clinical Pharmacist [phone]    Cassius DOROTHA Brought, PharmD, BCACP Clinical Pharmacist [phone]

## 2024-04-15 NOTE — Telephone Encounter (Signed)
 [name] PARAS, [facility] to Me (Selected Message)     04/15/24 10:03 AM Hey there! Look like [name] was filled on 03/20/24 for a 60 day supply. Insurance is saying it cannot be filled until 05/04/24 because it is too early.  Has the Patient lost a sensor?   Blessings,     [name], PharmD, BCACP Clinical Pharmacist [phone]   Spoke with patient and she states she never had a sensor. Patient states this is her first time with a G7 and she does not even know how to use it

## 2024-04-17 ENCOUNTER — Ambulatory Visit: Payer: Self-pay

## 2024-04-17 ENCOUNTER — Other Ambulatory Visit: Payer: Self-pay

## 2024-04-17 ENCOUNTER — Encounter: Payer: Self-pay | Admitting: Sports Medicine

## 2024-04-17 ENCOUNTER — Ambulatory Visit: Admitting: Sports Medicine

## 2024-04-17 DIAGNOSIS — M1612 Unilateral primary osteoarthritis, left hip: Secondary | ICD-10-CM | POA: Diagnosis not present

## 2024-04-17 MED ORDER — METHYLPREDNISOLONE ACETATE 40 MG/ML IJ SUSP
80.0000 mg | INTRAMUSCULAR | Status: AC | PRN
  Administered 2024-04-17: 80 mg via INTRA_ARTICULAR

## 2024-04-17 MED ORDER — LIDOCAINE HCL 1 % IJ SOLN
4.0000 mL | INTRAMUSCULAR | Status: AC | PRN
  Administered 2024-04-17: 4 mL

## 2024-04-17 NOTE — Telephone Encounter (Signed)
 Already triaged, see earlier triage encounter.

## 2024-04-17 NOTE — Telephone Encounter (Signed)
 NT will attempt to reach patient to schedule appt with PCP per earlier triage response from provider.  CRM: Source  [name] (Patient)   Subject  [name], [name] (Patient)   Topic  Clinical - Medication Question    Communication  Reason for CRM: Patient stated that she will stop taking metformin  until her appointment on 04/23/24 due to the symptoms it caused her.

## 2024-04-17 NOTE — Progress Notes (Signed)
   Procedure Note  Patient: [name]             Date of Birth: 28-Oct-1955           MRN: [id number]             Visit Date: 04/17/2024  Procedures: Visit Diagnoses:  1. Primary osteoarthritis of left hip    Large Joint Inj: L hip joint on 04/17/2024 9:42 AM Indications: pain Details: 22 G 3.5 in needle, ultrasound-guided anterior approach Medications: 4 mL lidocaine  1 %; 80 mg methylPREDNISolone  acetate 40 MG/ML Outcome: tolerated well, no immediate complications  Procedure: US -guided intra-articular hip injection, left After discussion on risks/benefits/indications and informed verbal consent was obtained, a timeout was performed. Patient was lying supine on exam table. The hip was cleaned with betadine  and alcohol swabs. Then utilizing ultrasound guidance, the patient's femoral head and neck junction was identified and subsequently injected with 4:2 lidocaine :depomedrol via an in-plane approach with ultrasound visualization of the injectate administered into the hip joint. Patient tolerated procedure well without immediate complications.  Procedure, treatment alternatives, risks and benefits explained, specific risks discussed. Consent was given by the patient. Immediately prior to procedure a time out was called to verify the correct patient, procedure, equipment, support staff and site/side marked as required. Patient was prepped and draped in the usual sterile fashion.     - patient tolerated procedure well, discussed post-injection protocol - follow-up with Dr. Jerri as indicated; I am happy to see them as needed  Lonell Sprang, DO Primary Care Sports Medicine Physician  [facility]  This note was dictated using Dragon naturally speaking software and may contain errors in syntax, spelling, or content which have not been identified prior to signing this note.

## 2024-04-17 NOTE — Telephone Encounter (Signed)
 Please schedule appointment with [name] to discuss medication alternative.

## 2024-04-17 NOTE — Telephone Encounter (Signed)
 Patient had returned phone call to Nurse Triage. Agent attempted to transfer call but unable to hear patient. Will place back in call backs.

## 2024-04-17 NOTE — Telephone Encounter (Signed)
 FYI Only or Action Required?: Action required by provider: Requesting an alternative medication.  Patient was last seen in primary care on 04/04/2024 by [name], NP.  Called Nurse Triage reporting Medication Problem.  Symptoms began When taking metformin .  Interventions attempted: Nothing.  Symptoms are: gradually worsening.  Triage Disposition: See PCP When Office is Open (Within 3 Days)  Patient/caregiver understands and will follow disposition?: Yes         Message from Alfonso ORN sent at 04/17/2024  8:12 AM EDT  Summary: metFORMIN  (GLUCOPHAGE ) 500 MG tablet   Reason for Triage: issues with metFORMIN  (GLUCOPHAGE ) 500 MG tablet causes stomach to be upset causes nausea , second time causes symptoms         Reason for Disposition  Prescription request for new medicine (not a refill)  Answer Assessment - Initial Assessment Questions Patient requesting if there is another medication that she can be prescribed in place of the metformin . She states she does not like feeling nauseous. Patient would like a call back regarding this request and preferred pharmacy below.    [id number] - [place] ([place] - [address]  [address], [place] ([place]) [place] [address]       1. NAME of MEDICINE: What medicine(s) are you calling about?     Metformin   2. QUESTION: What is your question? (e.g., double dose of medicine, side effect)     Side effect of medication  3. PRESCRIBER: Who prescribed the medicine? Reason: if prescribed by specialist, call should be referred to that group.     PCP 4. SYMPTOMS: Do you have any symptoms? If Yes, ask: What symptoms are you having?  How bad are the symptoms (e.g., mild, moderate, severe)     Nausea is moderate in nature  Protocols used: Medication Question Call-A-AH

## 2024-04-17 NOTE — Telephone Encounter (Signed)
 Tried calling patient to relay Dinah's response and schedule an appointment.  LMOM to return call.

## 2024-04-17 NOTE — Telephone Encounter (Signed)
 Forwarded message to Roxan due to Harlene out of office.   Please Advise.

## 2024-04-18 NOTE — Telephone Encounter (Signed)
Tried calling patient. LMOM to return call.  °

## 2024-04-21 NOTE — Telephone Encounter (Signed)
 Appointment scheduled for appointment 04/23/2024

## 2024-04-23 ENCOUNTER — Ambulatory Visit (INDEPENDENT_AMBULATORY_CARE_PROVIDER_SITE_OTHER): Admitting: Family

## 2024-04-23 ENCOUNTER — Encounter: Payer: Self-pay | Admitting: Family

## 2024-04-23 VITALS — BP 136/88 | HR 87 | Temp 97.7°F | Resp 20 | Ht 59.0 in | Wt 153.0 lb

## 2024-04-23 DIAGNOSIS — I10 Essential (primary) hypertension: Secondary | ICD-10-CM | POA: Diagnosis not present

## 2024-04-23 DIAGNOSIS — Z23 Encounter for immunization: Secondary | ICD-10-CM

## 2024-04-23 DIAGNOSIS — M1612 Unilateral primary osteoarthritis, left hip: Secondary | ICD-10-CM | POA: Diagnosis not present

## 2024-04-23 DIAGNOSIS — R7303 Prediabetes: Secondary | ICD-10-CM | POA: Diagnosis not present

## 2024-04-23 MED ORDER — MELOXICAM 7.5 MG PO TABS: 7.5000 mg | ORAL_TABLET | Freq: Every day | ORAL | 1 refills | Status: AC | PRN

## 2024-05-04 NOTE — Progress Notes (Signed)
 Provider: Miriah Maruyama FNP-C   [name], NP  Patient Care Team: [name], NP as PCP - General (Geriatric Medicine)  Extended Emergency Contact Information Primary Emergency Contact: CARTER,GILDA Mobile Phone: [phone] Relation: Sister  Code Status:  Full Code  Goals of care: Advanced Directive information    04/04/2024    9:09 AM  Advanced Directives  Does Patient Have a Medical Advance Directive? No  Would patient like information on creating a medical advance directive? No - Patient declined     Chief Complaint  Patient presents with   Medication Management    Discussed the use of AI scribe software for clinical note transcription with the patient, who gave verbal consent to proceed.  History of Present Illness   [name] is a 68 year old female who presents with intolerance to metformin .  She experienced gastrointestinal discomfort after starting metformin  on September 11, leading to persistent stomach pain during her work shifts. She discontinued the medication after two episodes of discomfort. She was instructed to take two pills a day. Her sister and a friend had similar experiences with metformin  and were taken off the medication by their doctors.  She is uncertain about her current blood sugar levels but notes a weight loss from 160 pounds to 153 pounds. She has not received the G7 sensor for home blood sugar monitoring despite receiving the G7 device. She has been in contact with her insurance and pharmacy regarding this issue, but it remains unresolved.  Her last A1c was 6.4, which she was informed by another provider indicated diabetes, leading to the prescription of metformin . However, she expresses confusion as she was also informed that this level is consistent with prediabetes. She is frustrated by the conflicting information regarding her diagnosis and treatment.  She works night shifts and engages in a lot of walking at work. She  has purchased a pedal bike for additional exercise. She avoids sodas and juices, occasionally drinking light soda. She is concerned about being given diabetes medication if she is not diabetic.  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 She received a hip injection for this issue.   Past Medical History:  Diagnosis Date   Acid reflux    [facility]   Alcohol use    Per Records received from [facility]- pt states she only has a Radilla of wine occasionally   Arthritis    Knee and hip   BMI 36.0-36.9,adult    Per Records received from [facility]   Chronic knee pain    L & R   GERD (gastroesophageal reflux disease)    Heart murmur, systolic 09/19/2021   1/6 systolic   Hyperlipidemia    Hypertension    Morbid obesity (HCC)    Per [facility] received from [facility]   Other osteoarthritis involving multiple joints    Per Records received from [facility]   Prediabetes    Per Records received from [facility]   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[facility])    Per records from [facility]   Right hip pain    Per records from [facility]   Right thigh pain 2022   Per records from [facility]   Vitamin D  deficiency    Per Records received from [facility]   Past Surgical History:  Procedure Laterality Date  COLONOSCOPY     JOINT REPLACEMENT Left 07/11/2018   knee replacement   TOE SURGERY Right    bone spur shaved down- many years ago   TOTAL HIP ARTHROPLASTY Right 09/21/2021   Procedure: RIGHT TOTAL HIP ARTHROPLASTY ANTERIOR APPROACH;  Surgeon: Jerri Kay HERO, MD;  Location: [facility] OR;  Service: Orthopedics;  Laterality: Right;   TUBAL LIGATION  1978   Per records from [facility]    No Known Allergies  Allergies as of 04/23/2024   No Known Allergies      Medication List        Accurate as of April 23, 2024 11:59 PM. If you have any questions, ask your nurse or doctor.          STOP taking these medications    metFORMIN  500 MG tablet Commonly known as: GLUCOPHAGE  Stopped by: Dany Walther C Arleth Mccullar       TAKE these medications    acetaminophen  500 MG tablet Commonly known as: TYLENOL  Take 500 mg by mouth as needed.   calcium  carbonate 600 MG Tabs tablet Commonly known as: Calcium  600 Take 1 tablet (600 mg total) by mouth 2 (two) times daily with a meal.   [name] G7 Receiver Devi 1 Device by Does not apply route daily. E11.69   [name] G7 Sensor Misc 1 Device by Does not apply route daily. E11.69   gabapentin  100 MG capsule Commonly known as: NEURONTIN  Take 1 capsule (100 mg total) by mouth at bedtime. What changed:  when to take this reasons to take this   hydrOXYzine  25 MG tablet Commonly known as: ATARAX  Take 1 tablet (25 mg total) by mouth every 8 (eight) hours as needed for itching.   lisinopril -hydrochlorothiazide  20-12.5 MG tablet Commonly known as: ZESTORETIC  Take 1 tablet by mouth once daily   meloxicam  7.5 MG tablet Commonly known as: MOBIC  Take 1 tablet (7.5 mg total) by mouth daily as needed for pain. Take with food   multivitamin with minerals Tabs tablet Take 1 tablet by mouth daily.   pantoprazole  20 MG tablet Commonly known as: PROTONIX  Take 1 tablet by mouth once daily   rosuvastatin  20 MG tablet Commonly known as: CRESTOR  Take 1 tablet by mouth once daily   traMADol  50 MG tablet Commonly known as: ULTRAM  Take 1-2 tablets (50-100 mg total) by mouth daily as needed.   Vitamin D3 50 MCG (2000 UT) capsule Take 1 capsule (2,000 Units total) by mouth daily.        Review of Systems  Constitutional:  Negative for appetite change, chills, fatigue, fever and unexpected weight change.  HENT:  Negative for congestion, ear discharge, ear pain, hearing loss, nosebleeds, postnasal drip, rhinorrhea, sinus pressure, sinus pain, sneezing, sore throat  and tinnitus.   Eyes:  Negative for pain, discharge, redness, itching and visual disturbance.  Respiratory:  Negative for cough, chest tightness, shortness of breath and wheezing.   Cardiovascular:  Negative for chest pain, palpitations and leg swelling.  Gastrointestinal:  Negative for abdominal distention, abdominal pain, blood in stool, constipation, diarrhea, nausea and vomiting.  Endocrine: Negative for cold intolerance, heat intolerance, polydipsia, polyphagia and polyuria.  Genitourinary:  Negative for difficulty urinating, dysuria, flank pain, frequency and urgency.  Musculoskeletal:  Positive for arthralgias. Negative for back pain, gait problem, joint swelling, myalgias, neck pain and neck stiffness.  Skin:  Negative for color change, pallor, rash and wound.  Neurological:  Negative for dizziness, syncope, speech difficulty, weakness, light-headedness, numbness and  headaches.  Hematological:  Does not bruise/bleed easily.  Psychiatric/Behavioral:  Negative for agitation, behavioral problems, confusion, hallucinations, self-injury, sleep disturbance and suicidal ideas. The patient is not nervous/anxious.     Immunization History  Administered Date(s) Administered   Fluad Quad(high Dose 65+) 03/09/2021, 05/22/2022   INFLUENZA, HIGH DOSE SEASONAL PF 05/23/2023, 04/23/2024   Influenza Whole 04/19/2009   Influenza, Quadrivalent, Recombinant, Inj, Pf 04/23/2019   Influenza,inj,Quad PF,6+ Mos 05/17/2018   Moderna Covid-19 Fall Seasonal Vaccine 64yrs & older 05/23/2023   Moderna SARS-COV2 Booster Vaccination 01/14/2021   Moderna Sars-Covid-2 Vaccination 04/16/2020, 05/14/2020   PNEUMOCOCCAL CONJUGATE-20 04/25/2021   Pfizer Covid-19 Vaccine Bivalent Booster 44yrs & up 05/28/2021   Td 08/10/2006   Zoster Recombinant(Shingrix) 11/19/2015   Pertinent  Health Maintenance Due  Topic Date Due   Colonoscopy  Never done   DEXA SCAN  08/30/2023   Mammogram  06/05/2025   Influenza Vaccine   Completed      04/13/2023    8:53 AM 05/08/2023    9:22 AM 09/03/2023    9:27 AM 01/04/2024    9:25 AM 04/04/2024    9:09 AM  Fall Risk  Falls in the past year? 0 0 0 0 0  Was there an injury with Fall?  0 0 0 0  Fall Risk Category Calculator  0 0 0 0  Patient at Risk for Falls Due to    No Fall Risks No Fall Risks  Fall risk Follow up    Falls evaluation completed Falls evaluation completed   Functional Status Survey:    Vitals:   04/23/24 1407  BP: 136/88  Pulse: 87  Resp: 20  Temp: 97.7 F (36.5 C)  SpO2: 95%  Weight: 153 lb (69.4 kg)  Height: 4' 11 (1.499 m)   Body mass index is 30.9 kg/m. Physical Exam  VITALS: T- 97.7, P- 87, BP- 136/80, SaO2- 95% MEASUREMENTS: Weight- 153. GENERAL: Alert, cooperative, well developed, no acute distress. HEENT: Normocephalic, normal oropharynx, moist mucous membranes. CHEST: Clear to auscultation bilaterally, no wheezes, rhonchi, or crackles. CARDIOVASCULAR: Normal heart rate and rhythm, S1 and S2 normal without murmurs. ABDOMEN: Soft, non-tender, non-distended, without organomegaly, normal bowel sounds. EXTREMITIES: No cyanosis, edema, swelling, numbness, or tingling in legs. NEUROLOGICAL: Cranial nerves grossly intact, moves all extremities without gross motor or sensory deficit.    Labs reviewed: Recent Labs    06/04/23 1030 01/04/24 0954  NA 140 142  K 4.2 4.4  CL 107 107  CO2 25 27  GLUCOSE 97 92  BUN 15 12  CREATININE 0.77 0.71  CALCIUM  10.1 9.5   Recent Labs    06/04/23 1030 01/04/24 0954  AST 18 16  ALT 12 12  BILITOT 0.3 0.3  PROT 7.4 6.9   Recent Labs    06/04/23 1030 01/04/24 0954  WBC 6.9 6.2  NEUTROABS 3,077 3,026  HGB 13.0 12.6  HCT 39.5 38.5  MCV 89.4 92.1  PLT 269 266   Lab Results  Component Value Date   TSH 0.65 01/14/2021   Lab Results  Component Value Date   HGBA1C 6.4 (H) 01/04/2024   Lab Results  Component Value Date   CHOL 163 01/04/2024   HDL 64 01/04/2024   LDLCALC  81 01/04/2024   TRIG 98 01/04/2024   CHOLHDL 2.5 01/04/2024    Significant Diagnostic Results in last 30 days:  No results found.  Assessment/Plan  Prediabetes A1c of 6.4% indicates prediabetes. Previously started on metformin  but experienced significant gastrointestinal side effects,  leading to discontinuation. Confusion regarding diagnosis as she was informed she was diabetic, but guidelines indicate prediabetes. Emphasis on lifestyle modifications to prevent progression to diabetes. - Discontinue metformin  due to gastrointestinal side effects. - Recheck A1c in December to assess current status. - Encourage regular exercise and dietary modifications to manage blood sugar levels. - Educate on the importance of maintaining a healthy lifestyle to prevent progression to diabetes.  Hypertension Blood pressure recorded at 136/8, slightly elevated. Did not take medication prior to the visit, which may have contributed to the elevated reading. - Ensure adherence to antihypertensive medication regimen.   Osteoarthritis left hip  - chronic  - start on Meloxicam  7.5 mg tablet daily   Family/ staff Communication: Reviewed plan of care with patient verbalized understanding   Labs/tests ordered: None   Next Appointment : Return if symptoms worsen or fail to improve.   Spent 20 minutes of Face to face and non-face to face with patient  >50% time spent counseling; reviewing medical record; tests; labs; documentation and developing future plan of care.   Roxan JAYSON Plough, NP

## 2024-05-08 ENCOUNTER — Ambulatory Visit (HOSPITAL_COMMUNITY)
Admission: EM | Admit: 2024-05-08 | Discharge: 2024-05-08 | Disposition: A | Discharge status: Home / Self Care | Attending: Nurse Practitioner | Admitting: Nurse Practitioner

## 2024-05-08 ENCOUNTER — Encounter (HOSPITAL_COMMUNITY): Payer: Self-pay

## 2024-05-08 DIAGNOSIS — M545 Low back pain, unspecified: Secondary | ICD-10-CM | POA: Diagnosis not present

## 2024-05-08 MED ORDER — KETOROLAC TROMETHAMINE 60 MG/2ML IM SOLN
INTRAMUSCULAR | Status: AC
  Filled 2024-05-08: qty 2

## 2024-05-08 MED ORDER — TIZANIDINE HCL 2 MG PO TABS: 2.0000 mg | ORAL_TABLET | Freq: Every morning | ORAL | 0 refills | Status: AC

## 2024-05-08 MED ORDER — DEXAMETHASONE SOD PHOSPHATE PF 10 MG/ML IJ SOLN
10.0000 mg | Freq: Once | INTRAMUSCULAR | Status: AC
  Administered 2024-05-08: 10 mg via INTRAMUSCULAR

## 2024-05-08 MED ORDER — CYCLOBENZAPRINE HCL 10 MG PO TABS: 10.0000 mg | ORAL_TABLET | Freq: Every day | ORAL | 0 refills | Status: AC

## 2024-05-08 MED ORDER — KETOROLAC TROMETHAMINE 60 MG/2ML IM SOLN
60.0000 mg | Freq: Once | INTRAMUSCULAR | Status: AC
  Administered 2024-05-08: 60 mg via INTRAMUSCULAR

## 2024-05-08 MED ORDER — NAPROXEN 500 MG PO TABS: 500.0000 mg | ORAL_TABLET | Freq: Two times a day (BID) | ORAL | 0 refills | Status: AC

## 2024-05-08 NOTE — ED Triage Notes (Signed)
 Patient is here for lower back pain that started last night. Denies any recent injuries at this time.

## 2024-05-08 NOTE — ED Provider Notes (Signed)
 [facility]    CSN: [id number] Arrival date & time: 05/08/24  1821      History   Chief Complaint Chief Complaint  Patient presents with   Back Pain    HPI [name] is a 68 y.o. female.   Discussed the use of AI scribe software for clinical note transcription with the patient, who gave verbal consent to proceed.   The patient presents with lower back pain that began last night. The pain is localized to the lower back and radiates upward. She describes the discomfort as a shifting pain and notes occasional tingling in the left buttock. The patient has a known history of arthritis in the left hip and wonders if the symptoms may be related. She took tramadol  for pain relief without improvement and has not yet tried ice or heat therapy. She denies urinary symptoms, including burning with urination, and reports no bowel or bladder incontinence.  The following sections of the patient's history were reviewed and updated as appropriate: allergies, current medications, past family history, past medical history, past social history, past surgical history, and problem list.     Past Medical History:  Diagnosis Date   Acid reflux    [facility]   Alcohol use    Per Records received from [facility]- pt states she only has a [place] of wine occasionally   Arthritis    Knee and hip   BMI 36.0-36.9,adult    Per Records received from [facility]   Chronic knee pain    L & R   GERD (gastroesophageal reflux disease)    Heart murmur, systolic 09/19/2021   1/6 systolic   Hyperlipidemia    Hypertension    Morbid obesity (HCC)    Per Records received from [facility]   Other osteoarthritis involving multiple joints    Per Records received from [facility]   Prediabetes    Per Records received from [facility]   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[facility])    [facility]   Right hip pain    Per records [facility]   Right thigh pain 2022   [facility]   Vitamin D  deficiency    Per Records received from [facility]    Patient Active Problem List   Diagnosis Date Noted   Chronic radicular pain of lower back 01/09/2024   Hyperglycemia 01/09/2024   Acute gout due to renal impairment involving toe of left foot 06/11/2023   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   Hyperlipidemia 08/18/2021   Hypertension 08/18/2021   Obesity 08/18/2021   Primary osteoarthritis of right hip 03/24/2021   Trigger finger, acquired 08/12/2009   Anxiety state 05/20/2009   CANDIDIASIS, SKIN 03/12/2009   TINGLING 12/24/2008   CHEST PAIN 02/03/2008   HYPOKALEMIA 11/07/2007   Other specified disorders of bladder 09/26/2007   Headache 08/07/2007   Constipation 07/23/2007   MICROSCOPIC HEMATURIA 07/23/2007   Dental caries 05/27/2007   TINEA PEDIS 05/23/2007   Allergic rhinitis 05/23/2007   GERD 05/23/2007   Diaphragmatic hernia 05/23/2007   DEGENERATIVE JOINT DISEASE, LEFT KNEE 05/23/2007   Essential hypertension, benign 05/20/2007    Past Surgical History:  Procedure Laterality Date   COLONOSCOPY     JOINT REPLACEMENT Left 07/11/2018   knee replacement   TOE SURGERY Right    bone spur  shaved down- many years ago   TOTAL HIP ARTHROPLASTY Right 09/21/2021   Procedure: RIGHT TOTAL HIP ARTHROPLASTY ANTERIOR APPROACH;  Surgeon: Jerri Kay HERO, MD;  Location: [facility] OR;  Service: Orthopedics;  Laterality: Right;   TUBAL LIGATION  1978   [facility]    MAINE History   No obstetric history on file.      Home Medications    Prior to Admission medications   Medication Sig Start Date End Date Taking? Authorizing Provider  acetaminophen  (TYLENOL ) 500 MG tablet Take 500 mg by mouth as needed.   Yes [provider]  calcium  carbonate (CALCIUM  600) 600 MG TABS tablet Take 1  tablet (600 mg total) by mouth 2 (two) times daily with a meal. 06/12/22  Yes [name], NP  Cholecalciferol (VITAMIN D3) 50 MCG (2000 UT) capsule Take 1 capsule (2,000 Units total) by mouth daily. 06/12/22  Yes [name], NP  cyclobenzaprine  (FLEXERIL ) 10 MG tablet Take 1 tablet (10 mg total) by mouth at bedtime. 05/08/24  Yes Iola Lukes, FNP  gabapentin  (NEURONTIN ) 100 MG capsule Take 1 capsule (100 mg total) by mouth at bedtime. Patient taking differently: Take 100 mg by mouth as needed. 03/13/24  Yes Medina-Vargas, Monina C, NP  hydrOXYzine  (ATARAX ) 25 MG tablet Take 1 tablet (25 mg total) by mouth every 8 (eight) hours as needed for itching. 12/11/23  Yes Lenor Hollering, MD  lisinopril -hydrochlorothiazide  (ZESTORETIC ) 20-12.5 MG tablet Take 1 tablet by mouth once daily 01/22/24  Yes Eubanks, Jessica K, NP  meloxicam  (MOBIC ) 7.5 MG tablet Take 1 tablet (7.5 mg total) by mouth daily as needed for pain. Take with food 04/23/24  Yes Ngetich, Dinah C, NP  Multiple Vitamin (MULTIVITAMIN WITH MINERALS) TABS tablet Take 1 tablet by mouth daily.   Yes [provider]  naproxen  (NAPROSYN ) 500 MG tablet Take 1 tablet (500 mg total) by mouth 2 (two) times daily with a meal. Take with food to avoid stomach upset. Do not take any additional NSAIDs while on this. You may take tylenol  in addition to this if needed for extra pain relief. 05/08/24  Yes Iola Lukes, FNP  pantoprazole  (PROTONIX ) 20 MG tablet Take 1 tablet by mouth once daily 07/23/23  Yes Eubanks, Jessica K, NP  rosuvastatin  (CRESTOR ) 20 MG tablet Take 1 tablet by mouth once daily 01/22/24  Yes Eubanks, Jessica K, NP  tiZANidine (ZANAFLEX) 2 MG tablet Take 1 tablet (2 mg total) by mouth in the morning for 10 days. 05/08/24 05/18/24 Yes Reeves Musick, FNP  traMADol  (ULTRAM ) 50 MG tablet Take 1-2 tablets (50-100 mg total) by mouth daily as needed. 04/03/24  Yes Jerri Kay HERO, MD    Family History Family History   Problem Relation Age of Onset   Colon polyps Mother    Hypertension Mother        [facility]   Cancer Mother 45       colon   Colon cancer Mother        [facility]   Cancer Father 72       lung   Hypertension Father        [facility]   Lung disease Father        [facility]   Seizures Brother    Epilepsy  Brother    Drug abuse Daughter    Other Daughter        Murder in 2014   Stomach cancer Neg Hx    Rectal cancer Neg Hx    Esophageal cancer Neg Hx     Social History Social History   Tobacco Use   Smoking status: Never   Smokeless tobacco: Never  Vaping Use   Vaping status: Never Used  Substance Use Topics   Alcohol use: Yes    Comment: pt states she may have 1 Tani of wine on special occasions   Drug use: No     Allergies   Patient has no known allergies.   Review of Systems Review of Systems  Musculoskeletal:  Positive for back pain (low) and gait problem (due to pain).  Neurological:  Negative for weakness and numbness.       Tingling in left buttocks  All other systems reviewed and are negative.    Physical Exam Triage Vital Signs ED Triage Vitals  Encounter Vitals Group     BP 05/08/24 1936 (!) 178/73     Girls Systolic BP Percentile --      Girls Diastolic BP Percentile --      Boys Systolic BP Percentile --      Boys Diastolic BP Percentile --      Pulse Rate 05/08/24 1936 66     Resp 05/08/24 1936 20     Temp 05/08/24 1936 98 F (36.7 C)     Temp Source 05/08/24 1936 Oral     SpO2 05/08/24 1936 98 %     Weight --      Height --      Head Circumference --      Peak Flow --      Pain Score 05/08/24 1935 8     Pain Loc --      Pain Education --      Exclude from Growth Chart --    No data found.  Updated Vital Signs BP (!) 178/73 (BP Location: Left Arm)    Pulse 66   Temp 98 F (36.7 C) (Oral)   Resp 20   SpO2 98%   Visual Acuity Right Eye Distance:   Left Eye Distance:   Bilateral Distance:    Right Eye Near:   Left Eye Near:    Bilateral Near:     Physical Exam Vitals reviewed.  Constitutional:      General: She is not in acute distress.    Appearance: Normal appearance. She is not ill-appearing, toxic-appearing or diaphoretic.  HENT:     Head: Normocephalic.     Mouth/Throat:     Mouth: Mucous membranes are moist.  Cardiovascular:     Rate and Rhythm: Normal rate and regular rhythm.  Pulmonary:     Effort: Pulmonary effort is normal.     Breath sounds: Normal breath sounds.  Abdominal:     Palpations: Abdomen is soft.     Tenderness: There is no right CVA tenderness or left CVA tenderness.  Musculoskeletal:        General: Normal range of motion.     Cervical back: Normal, normal range of motion and neck supple.     Thoracic back: Normal.     Lumbar back: Tenderness present. No swelling, deformity, lacerations or spasms. Normal range of motion. Negative right straight leg raise test and negative left straight leg raise test.  Skin:    General: Skin is warm and dry.  Neurological:     General: No focal deficit present.     Mental Status: She is alert and oriented to person, place, and time.     Cranial Nerves: Cranial nerves 2-12 are intact.     Sensory: Sensation is intact.     Motor: Motor function is intact. No weakness.     Coordination: Coordination is intact.     Gait: Gait is intact.  Psychiatric:        Mood and Affect: Mood normal.        Speech: Speech normal.        Behavior: Behavior normal. Behavior is cooperative.      UC Treatments / Results  Labs (all labs ordered are listed, but only abnormal results are displayed) Labs Reviewed - No data to display  EKG   Radiology No results found.  Procedures Procedures (including critical care time)  Medications Ordered in UC Medications   dexamethasone  (DECADRON ) injection 10 mg (has no administration in time range)  ketorolac  (TORADOL ) injection 60 mg (60 mg Intramuscular Given 05/08/24 2019)    Initial Impression / Assessment and Plan / UC Course  I have reviewed the triage vital signs and the nursing notes.  Pertinent labs & imaging results that were available during my care of the patient were reviewed by me and considered in my medical decision making (see chart for details).     Patient presents with low back pain. No numbness, tingling, or weakness in the buttocks or legs. No recent injury, heavy lifting, or pulling reported. No urinary symptoms or blood in stool. Examination and history are consistent with a back muscle strain. Toradol  and dexamethasone  were administered intramuscularly for acute pain and inflammation relief. Naproxen  prescribed twice daily, with Tizanidine for morning use and Flexeril  for nighttime use. Extra-strength acetaminophen  may be taken for additional pain relief, but no other NSAIDs should be used while on naproxen . Advised application of moist heat, avoidance of heavy lifting or strenuous activity, and follow-up with orthopedics in 10 days if symptoms persist. Patient instructed to seek immediate care for new numbness, weakness, bowel or bladder incontinence, severe worsening pain, or fever.  Today's evaluation has revealed no signs of a dangerous process. Discussed diagnosis with patient and/or guardian. Patient and/or guardian aware of their diagnosis, possible red flag symptoms to watch out for and need for close follow up. Patient and/or guardian understands verbal and written discharge instructions. Patient and/or guardian comfortable with plan and disposition.  Patient and/or guardian has a clear mental status at this time, good insight into illness (after discussion and teaching) and has clear judgment to make decisions regarding their care  Documentation was completed with the aid of voice  recognition software. Transcription may contain typographical errors.   Final Clinical Impressions(s) / UC Diagnoses   Final diagnoses:  Acute left-sided low back pain without sciatica     Discharge Instructions      You were seen today for low back pain. Your symptoms and exam suggest a muscle strain, which can occur from overuse or awkward movement even without a specific injury. There are no signs of nerve damage or other serious problems at this time.  You received Toradol  and dexamethasone  injections in the clinic to help reduce pain and inflammation. At home, take naproxen  twice a day with food as prescribed. You may also use extra-strength acetaminophen  for additional pain relief if needed, but do not take other anti-inflammatory medications such as ibuprofen or Aleve  while using naproxen . Tizanidine  may be taken in the morning to help relax your muscles and Flexeril  should be taken at night to ease discomfort and promote rest. Apply moist heat to the lower back several times a day for 15-20 minutes to help relax the muscles and relieve stiffness. Avoid heavy lifting, bending, or strenuous activity until your pain improves. If your symptoms do not improve within about 10 days, or if the pain worsens, follow up with an orthopedic specialist for further evaluation. Go to the emergency department immediately if you experience new numbness, weakness, loss of bladder or bowel control, severe or increasing pain, or fever.     ED Prescriptions     Medication Sig Dispense Auth. Provider   cyclobenzaprine  (FLEXERIL ) 10 MG tablet Take 1 tablet (10 mg total) by mouth at bedtime. 10 tablet Iola Lukes, FNP   naproxen  (NAPROSYN ) 500 MG tablet Take 1 tablet (500 mg total) by mouth 2 (two) times daily with a meal. Take with food to avoid stomach upset. Do not take any additional NSAIDs while on this. You may take tylenol  in addition to this if needed for extra pain relief. 20 tablet Iola Lukes, FNP   tiZANidine (ZANAFLEX) 2 MG tablet Take 1 tablet (2 mg total) by mouth in the morning for 10 days. 10 tablet Iola Lukes, FNP      PDMP not reviewed this encounter.   Iola Lukes, [place] 05/08/24 2019

## 2024-05-08 NOTE — Discharge Instructions (Addendum)
 You were seen today for low back pain. Your symptoms and exam suggest a muscle strain, which can occur from overuse or awkward movement even without a specific injury. There are no signs of nerve damage or other serious problems at this time.  You received Toradol  and dexamethasone  injections in the clinic to help reduce pain and inflammation. At home, take naproxen  twice a day with food as prescribed. You may also use extra-strength acetaminophen  for additional pain relief if needed, but do not take other anti-inflammatory medications such as ibuprofen or Aleve  while using naproxen . Tizanidine may be taken in the morning to help relax your muscles and Flexeril  should be taken at night to ease discomfort and promote rest. Apply moist heat to the lower back several times a day for 15-20 minutes to help relax the muscles and relieve stiffness. Avoid heavy lifting, bending, or strenuous activity until your pain improves. If your symptoms do not improve within about 10 days, or if the pain worsens, follow up with an orthopedic specialist for further evaluation. Go to the emergency department immediately if you experience new numbness, weakness, loss of bladder or bowel control, severe or increasing pain, or fever.

## 2024-05-12 ENCOUNTER — Encounter: Payer: Self-pay | Admitting: Radiology

## 2024-05-13 ENCOUNTER — Telehealth: Payer: Self-pay | Admitting: *Deleted

## 2024-05-13 ENCOUNTER — Ambulatory Visit: Payer: Medicare HMO | Admitting: Nurse Practitioner

## 2024-05-13 DIAGNOSIS — Z0000 Encounter for general adult medical examination without abnormal findings: Secondary | ICD-10-CM | POA: Diagnosis not present

## 2024-05-13 DIAGNOSIS — E1169 Type 2 diabetes mellitus with other specified complication: Secondary | ICD-10-CM | POA: Diagnosis not present

## 2024-05-13 DIAGNOSIS — R739 Hyperglycemia, unspecified: Secondary | ICD-10-CM

## 2024-05-13 NOTE — Progress Notes (Signed)
 Subjective:   [name] is a 68 y.o. female who presents for a Medicare Annual Wellness Visit.  Allergies (verified) Patient has no known allergies.   History: Past Medical History:  Diagnosis Date   Acid reflux    [facility]   Alcohol use    Per Records received from [facility]- pt states she only has a Zoll of wine occasionally   Arthritis    Knee and hip   BMI 36.0-36.9,adult    Per Records received from [facility]   Chronic knee pain    L & R   GERD (gastroesophageal reflux disease)    Heart murmur, systolic 09/19/2021   1/6 systolic   Hyperlipidemia    Hypertension    Morbid obesity (HCC)    Per Records received from [facility]   Other osteoarthritis involving multiple joints    Per Records received from [facility]   Prediabetes    Per Records received from [facility]   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[facility])    [facility]   Right hip pain    Per records [facility]   Right thigh pain 2022   [facility]   Vitamin D  deficiency    Per Records received from [facility]   Past Surgical History:  Procedure Laterality Date   COLONOSCOPY     JOINT REPLACEMENT Left 07/11/2018   knee replacement   TOE SURGERY Right    bone spur shaved down- many years ago   TOTAL HIP ARTHROPLASTY Right 09/21/2021   Procedure: RIGHT TOTAL HIP ARTHROPLASTY ANTERIOR APPROACH;  Surgeon: Jerri Kay HERO, MD;  Location: [facility] OR;  Service: Orthopedics;  Laterality: Right;   TUBAL LIGATION  1978   [facility]   Family History  Problem Relation Age of Onset   Colon polyps Mother    Hypertension Mother        [facility]   Cancer Mother 33       colon   Colon cancer Mother        [facility] [place]   Cancer  Father 72       lung   Hypertension Father        [facility] [place]   Lung disease Father        [facility]   Seizures Brother    Epilepsy Brother    Drug abuse Daughter    Other Daughter        Murder in 2014   Stomach cancer Neg Hx    Rectal cancer Neg Hx    Esophageal cancer Neg Hx    Social History   Occupational History   Not on file  Tobacco Use   Smoking status: Never   Smokeless tobacco: Never  Vaping Use   Vaping status: Never Used  Substance and Sexual Activity   Alcohol use: Yes    Comment: pt states she may have 1 Stroble of wine on special occasions   Drug use: No   Sexual activity: Not on file   Tobacco Counseling Counseling given: Not Answered  SDOH Screenings   Food Insecurity: No Food Insecurity (05/13/2024)  Housing: Unknown (05/13/2024)  Transportation Needs: No Transportation Needs (05/13/2024)  Utilities: Not At Risk (05/13/2024)  Depression (PHQ2-9): Low Risk  (05/13/2024)  Social Connections: Moderately Isolated (01/04/2024)  Tobacco Use: Low Risk  (05/08/2024)   Depression Screen    05/13/2024    9:03 AM 04/04/2024    9:09 AM 10/19/2023   11:38 AM 09/03/2023    9:27 AM 05/08/2023    9:22 AM 04/13/2023    8:53 AM 01/24/2023    2:58 PM  PHQ 2/9 Scores  PHQ - 2 Score 0 0 0 0 0 0 0  PHQ- 9 Score   0    0     Goals Addressed   None    Visit info / Clinical Intake: Medicare Wellness Visit Type:: Subsequent Annual Wellness Visit Medicare Wellness Visit Mode:: Video If telephone or video:: unable to obtan vitals due to lack of equipment Interpreter Needed?: No Pre-visit prep was completed: no AWV questionnaire completed by patient prior to visit?: no Living arrangements:: (!) with family/others; lives alone Patient's Overall Health Status Rating: good Typical amount of pain: (!) a lot Does pain affect daily life?: (!) yes Are you currently prescribed opioids?:  (!) yes  Dietary Habits and Nutritional Risks How many meals a day?: 2 Eats fruit and vegetables daily?: yes Most meals are obtained by: preparing own meals Diabetic:: no  Functional Status Activities of Daily Living (to include ambulation/medication): Independent Ambulation: Independent Medication Administration: Independent Home Management: Independent Manage your own finances?: yes Primary transportation is: driving Concerns about vision?: (!) yes (has cataracts) Concerns about hearing?: no  Fall Screening Falls in the past year?: 0 Number of falls in past year: 0 Was there an injury with Fall?: 0 Fall Risk Category Calculator: 0 Patient Fall Risk Level: Low Fall Risk  Fall Risk Patient at Risk for Falls Due to: No Fall Risks Fall risk Follow up: Falls evaluation completed  Home and Transportation Safety: All rugs have non-skid backing?: N/A, no rugs All stairs or steps have railings?: N/A, no stairs Grab bars in the bathtub or shower?: (!) no Have non-skid surface in bathtub or shower?: yes Good home lighting?: yes Regular seat belt use?: yes Hospital stays in the last year:: no  Cognitive Assessment Difficulty concentrating, remembering, or making decisions? : no Will 6CIT or Mini Cog be Completed: yes What year is it?: 0 points What month is it?: 0 points Give patient an address phrase to remember (5 components): [address] [place]  About what time is it?: 0 points Count backwards from 20 to 1: 0 points Say the months of the year in reverse: 0 points Repeat the address phrase from earlier: 0 points 6 CIT Score: 0 points  Advance Directives (For Healthcare) Does Patient Have a Medical Advance Directive?: No Would patient like information on creating a medical advance directive?: No - Patient declined  Reviewed/Updated  Reviewed/Updated: All        Objective:    There were no vitals filed for this visit. There is no height or weight on  file to calculate BMI.  Current Medications (verified) Outpatient Encounter Medications as of 05/13/2024  Medication Sig   acetaminophen  (TYLENOL ) 500 MG tablet Take 500 mg by mouth as needed.   calcium  carbonate (CALCIUM  600) 600 MG TABS tablet Take 1 tablet (600 mg total) by mouth 2 (two) times daily with a meal.   Cholecalciferol (VITAMIN D3) 50 MCG (2000 UT) capsule Take 1 capsule (2,000 Units total) by mouth daily.   cyclobenzaprine  (FLEXERIL )  10 MG tablet Take 1 tablet (10 mg total) by mouth at bedtime.   gabapentin  (NEURONTIN ) 100 MG capsule Take 1 capsule (100 mg total) by mouth at bedtime.   hydrOXYzine  (ATARAX ) 25 MG tablet Take 1 tablet (25 mg total) by mouth every 8 (eight) hours as needed for itching.   lisinopril -hydrochlorothiazide  (ZESTORETIC ) 20-12.5 MG tablet Take 1 tablet by mouth once daily   meloxicam  (MOBIC ) 7.5 MG tablet Take 1 tablet (7.5 mg total) by mouth daily as needed for pain. Take with food   Multiple Vitamin (MULTIVITAMIN WITH MINERALS) TABS tablet Take 1 tablet by mouth daily.   naproxen  (NAPROSYN ) 500 MG tablet Take 1 tablet (500 mg total) by mouth 2 (two) times daily with a meal. Take with food to avoid stomach upset. Do not take any additional NSAIDs while on this. You may take tylenol  in addition to this if needed for extra pain relief.   pantoprazole  (PROTONIX ) 20 MG tablet Take 1 tablet by mouth once daily   rosuvastatin  (CRESTOR ) 20 MG tablet Take 1 tablet by mouth once daily   tiZANidine (ZANAFLEX) 2 MG tablet Take 1 tablet (2 mg total) by mouth in the morning for 10 days.   traMADol  (ULTRAM ) 50 MG tablet Take 1-2 tablets (50-100 mg total) by mouth daily as needed.   No facility-administered encounter medications on file as of 05/13/2024.   Hearing/Vision screen Vision Screening - Comments:: [facility] Last Exam: 08/2023 Immunizations and Health Maintenance Health Maintenance  Topic Date Due   Diabetic kidney evaluation - Urine ACR  Never done    Colonoscopy  Never done   Zoster Vaccines- Shingrix (2 of 2) 01/14/2016   DTaP/Tdap/Td (2 - Tdap) 08/10/2016   DEXA SCAN  08/30/2023   COVID-19 Vaccine (6 - 2025-26 season) 03/10/2024   Diabetic kidney evaluation - eGFR measurement  01/03/2025   Medicare Annual Wellness (AWV)  05/13/2025   Mammogram  06/05/2025   Pneumococcal Vaccine: 50+ Years  Completed   Influenza Vaccine  Completed   Hepatitis C Screening  Completed   Meningococcal B Vaccine  Aged Out        Assessment/Plan:  This is a routine wellness examination for [place].  Patient Care Team: [name], NP as PCP - General (Geriatric Medicine)  I have personally reviewed and noted the following in the patient's chart:   Medical and social history Use of alcohol, tobacco or illicit drugs  Current medications and supplements including opioid prescriptions. Functional ability and status Nutritional status Physical activity Advanced directives List of other physicians Hospitalizations, surgeries, and ER visits in previous 12 months Vitals Screenings to include cognitive, depression, and falls Referrals and appointments  Orders Placed This Encounter  Procedures   Hemoglobin A1c   CMP   In addition, I have reviewed and discussed with patient certain preventive protocols, quality metrics, and best practice recommendations. A written personalized care plan for preventive services as well as general preventive health recommendations were provided to patient.   Harlene POUR Caro, NP   05/13/2024   Return in 1 year (on 05/13/2025).  After Visit Summary: (MyChart) Due to this being a telephonic visit, the after visit summary with patients personalized plan was offered to patient via MyChart

## 2024-05-13 NOTE — Progress Notes (Signed)
 This service is provided via telemedicine  No vital signs collected/recorded due to the encounter was a telemedicine visit.   Location of patient (ex: home, work):  Home  Patient consents to a telephone visit:  Yes  Location of the provider (ex: office, home):  Office [place]   Name of any referring provider:  na  Names of all persons participating in the telemedicine service and their role in the encounter:  [name], Patient, [name], CMA, Harlene An, NP  Time spent on call:  7:11

## 2024-05-13 NOTE — Telephone Encounter (Signed)
 Ms. darrelyn, morro are scheduled for a virtual visit with your provider today.    Just as we do with appointments in the office, we must obtain your consent to participate.  Your consent will be active for this visit and any virtual visit you Niveah Boerner have with one of our providers in the next 365 days.    If you have a MyChart account, I can also send a copy of this consent to you electronically.  All virtual visits are billed to your insurance company just like a traditional visit in the office.  As this is a virtual visit, video technology does not allow for your provider to perform a traditional examination.  This Janis Sol limit your provider's ability to fully assess your condition.  If your provider identifies any concerns that need to be evaluated in person or the need to arrange testing such as labs, EKG, etc, we will make arrangements to do so.    Although advances in technology are sophisticated, we cannot ensure that it will always work on either your end or our end.  If the connection with a video visit is poor, we Davison Ohms have to switch to a telephone visit.  With either a video or telephone visit, we are not always able to ensure that we have a secure connection.   I need to obtain your verbal consent now.   Are you willing to proceed with your visit today?   IKIA CINCOTTA has provided verbal consent on 05/13/2024 for a virtual visit (video or telephone).   MayDonzell DELENA, CMA 05/13/2024  8:32 AM

## 2024-05-13 NOTE — Patient Instructions (Signed)
 [name],  Thank you for taking the time for your Medicare Wellness Visit. I appreciate your continued commitment to your health goals. Please review the care plan we discussed, and feel free to reach out if I can assist you further.  Please note that Annual Wellness Visits do not include a physical exam. Some assessments may be limited, especially if the visit was conducted virtually. If needed, we may recommend an in-person follow-up with your provider.  Ongoing Care Seeing your primary care provider every 3 to 6 months helps us  monitor your health and provide consistent, personalized care.   Referrals If a referral was made during today's visit and you haven't received any updates within two weeks, please contact the referred provider directly to check on the status.  Recommended Screenings:  Health Maintenance  Topic Date Due   Yearly kidney health urinalysis for diabetes  Never done   Colon Cancer Screening  Never done   Zoster (Shingles) Vaccine (2 of 2) 01/14/2016   DTaP/Tdap/Td vaccine (2 - Tdap) 08/10/2016   DEXA scan (bone density measurement)  08/30/2023   COVID-19 Vaccine (6 - 2025-26 season) 03/10/2024   Yearly kidney function blood test for diabetes  01/03/2025   Medicare Annual Wellness Visit  05/13/2025   Breast Cancer Screening  06/05/2025   Pneumococcal Vaccine for age over 80  Completed   Flu Shot  Completed   Hepatitis C Screening  Completed   Meningitis B Vaccine  Aged Out       05/13/2024    9:04 AM  Advanced Directives  Does Patient Have a Medical Advance Directive? No  Would patient like information on creating a medical advance directive? No - Patient declined    Vision: Annual vision screenings are recommended for early detection of glaucoma, cataracts, and diabetic retinopathy. These exams can also reveal signs of chronic conditions such as diabetes and high blood pressure.  Dental: Annual dental screenings help detect early signs of oral cancer,  gum disease, and other conditions linked to overall health, including heart disease and diabetes.

## 2024-05-16 ENCOUNTER — Telehealth: Payer: Self-pay

## 2024-05-16 NOTE — Telephone Encounter (Signed)
   I will call the above shortly

## 2024-05-16 NOTE — Telephone Encounter (Signed)
 Incoming fax received from pharmacy to initiate a prior authorization for the [name] Receiver. I called patient as this is not listed on her active medication list. Patient states she has the sensor, however she never got the receiver and she would like for us  to proceed with the PA   Key: [id number]  The following message was received when attempting to complete prior auth:    I will try again shortly to complete clinical questions to complete prior authorization

## 2024-05-20 ENCOUNTER — Other Ambulatory Visit

## 2024-05-21 LAB — HEMOGLOBIN A1C
Hgb A1c MFr Bld: 6.5 % — ABNORMAL HIGH (ref <5.7)
Mean Plasma Glucose: 140 mg/dL
eAG (mmol/L): 7.7 mmol/L

## 2024-05-21 LAB — COMPREHENSIVE METABOLIC PANEL WITH GFR
AG Ratio: 1.4 (calc) (ref 1.0–2.5)
ALT: 14 U/L (ref 6–29)
AST: 17 U/L (ref 10–35)
Albumin: 4 g/dL (ref 3.6–5.1)
Alkaline phosphatase (APISO): 66 U/L (ref 37–153)
BUN: 11 mg/dL (ref 7–25)
CO2: 26 mmol/L (ref 20–32)
Calcium: 9.8 mg/dL (ref 8.6–10.4)
Chloride: 103 mmol/L (ref 98–110)
Creat: 0.65 mg/dL (ref 0.50–1.05)
Globulin: 2.9 g/dL (ref 1.9–3.7)
Glucose, Bld: 122 mg/dL — ABNORMAL HIGH (ref 65–99)
Potassium: 3.8 mmol/L (ref 3.5–5.3)
Sodium: 139 mmol/L (ref 135–146)
Total Bilirubin: 0.4 mg/dL (ref 0.2–1.2)
Total Protein: 6.9 g/dL (ref 6.1–8.1)
eGFR: 95 mL/min/1.73m2 (ref >=60)

## 2024-05-26 ENCOUNTER — Ambulatory Visit: Payer: Self-pay | Admitting: Nurse Practitioner

## 2024-06-09 ENCOUNTER — Ambulatory Visit

## 2024-06-24 MED ORDER — DEXCOM G7 RECEIVER DEVI: 1.0000 | 11 refills | Status: AC

## 2024-06-24 NOTE — Telephone Encounter (Signed)
 Outgoing call placed to initiate prior authorization for [name] G7 Receiver, Receiver approved due to compatibility components (patient already has sensors). We can expect an approval letter. Case number, [id number]

## 2024-07-07 ENCOUNTER — Ambulatory Visit: Payer: Self-pay | Admitting: Nurse Practitioner

## 2024-07-09 ENCOUNTER — Ambulatory Visit

## 2024-07-31 ENCOUNTER — Ambulatory Visit

## 2024-08-02 ENCOUNTER — Other Ambulatory Visit: Payer: Self-pay | Admitting: Nurse Practitioner

## 2024-08-04 ENCOUNTER — Ambulatory Visit: Admitting: Nurse Practitioner

## 2025-05-14 ENCOUNTER — Ambulatory Visit: Admitting: Nurse Practitioner
